# Patient Record
Sex: Female | Born: 1965 | Race: Black or African American | Hispanic: No | Marital: Married | Smoking: Never smoker
Health system: Southern US, Community
[De-identification: ages and names within clinical notes are randomized; demographics above are authoritative.]

## PROBLEM LIST (undated history)

## (undated) DIAGNOSIS — N852 Hypertrophy of uterus: Secondary | ICD-10-CM

## (undated) DIAGNOSIS — R739 Hyperglycemia, unspecified: Secondary | ICD-10-CM

## (undated) DIAGNOSIS — I10 Essential (primary) hypertension: Secondary | ICD-10-CM

## (undated) DIAGNOSIS — T7840XA Allergy, unspecified, initial encounter: Secondary | ICD-10-CM

## (undated) DIAGNOSIS — E559 Vitamin D deficiency, unspecified: Secondary | ICD-10-CM

## (undated) DIAGNOSIS — N393 Stress incontinence (female) (male): Secondary | ICD-10-CM

## (undated) DIAGNOSIS — E785 Hyperlipidemia, unspecified: Secondary | ICD-10-CM

## (undated) DIAGNOSIS — R87619 Unspecified abnormal cytological findings in specimens from cervix uteri: Secondary | ICD-10-CM

## (undated) DIAGNOSIS — B977 Papillomavirus as the cause of diseases classified elsewhere: Secondary | ICD-10-CM

## (undated) HISTORY — DX: Hyperlipidemia, unspecified: E78.5

## (undated) HISTORY — DX: Stress incontinence (female) (male): N39.3

## (undated) HISTORY — DX: Unspecified abnormal cytological findings in specimens from cervix uteri: R87.619

## (undated) HISTORY — DX: Vitamin D deficiency, unspecified: E55.9

## (undated) HISTORY — DX: Essential (primary) hypertension: I10

## (undated) HISTORY — DX: Allergy, unspecified, initial encounter: T78.40XA

## (undated) HISTORY — PX: TUBAL LIGATION: SHX77

## (undated) HISTORY — DX: Hypertrophy of uterus: N85.2

## (undated) HISTORY — DX: Papillomavirus as the cause of diseases classified elsewhere: B97.7

## (undated) HISTORY — DX: Hyperglycemia, unspecified: R73.9

---

## 2005-01-09 ENCOUNTER — Ambulatory Visit: Payer: Self-pay | Admitting: Family Medicine

## 2006-02-28 ENCOUNTER — Ambulatory Visit: Payer: Self-pay | Admitting: Family Medicine

## 2007-02-04 ENCOUNTER — Ambulatory Visit: Payer: Self-pay | Admitting: Family Medicine

## 2007-03-04 ENCOUNTER — Ambulatory Visit: Payer: Self-pay | Admitting: Family Medicine

## 2007-12-25 ENCOUNTER — Encounter: Payer: Self-pay | Admitting: Maternal & Fetal Medicine

## 2008-01-24 ENCOUNTER — Emergency Department: Payer: Self-pay | Admitting: Emergency Medicine

## 2008-05-27 ENCOUNTER — Encounter: Payer: Self-pay | Admitting: Maternal & Fetal Medicine

## 2008-07-11 ENCOUNTER — Inpatient Hospital Stay: Payer: Self-pay | Admitting: Obstetrics and Gynecology

## 2009-01-20 ENCOUNTER — Ambulatory Visit: Payer: Self-pay | Admitting: Family Medicine

## 2010-02-13 ENCOUNTER — Encounter: Payer: Self-pay | Admitting: Maternal & Fetal Medicine

## 2010-05-01 ENCOUNTER — Ambulatory Visit: Payer: Self-pay

## 2010-08-17 ENCOUNTER — Observation Stay: Payer: Self-pay | Admitting: Obstetrics and Gynecology

## 2010-08-31 ENCOUNTER — Inpatient Hospital Stay: Payer: Self-pay | Admitting: Advanced Practice Midwife

## 2010-09-05 LAB — PATHOLOGY REPORT

## 2011-07-03 ENCOUNTER — Ambulatory Visit: Payer: Self-pay | Admitting: Family Medicine

## 2013-02-10 ENCOUNTER — Ambulatory Visit: Payer: Self-pay | Admitting: Family Medicine

## 2014-02-05 DIAGNOSIS — B977 Papillomavirus as the cause of diseases classified elsewhere: Secondary | ICD-10-CM | POA: Insufficient documentation

## 2014-02-05 LAB — LIPID PANEL
Cholesterol: 217 mg/dL — AB (ref 0–200)
HDL: 50 mg/dL (ref 35–70)
LDL Cholesterol: 144 mg/dL
Triglycerides: 115 mg/dL (ref 40–160)

## 2014-02-05 LAB — HM PAP SMEAR: HM PAP: HIGH

## 2014-02-11 ENCOUNTER — Ambulatory Visit: Payer: Self-pay | Admitting: Family Medicine

## 2014-02-11 LAB — HM MAMMOGRAPHY: HM MAMMO: NORMAL

## 2014-03-01 LAB — HEMOGLOBIN A1C: Hgb A1c MFr Bld: 5.7 % (ref 4.0–6.0)

## 2015-01-21 DIAGNOSIS — R87619 Unspecified abnormal cytological findings in specimens from cervix uteri: Secondary | ICD-10-CM

## 2015-01-21 HISTORY — DX: Unspecified abnormal cytological findings in specimens from cervix uteri: R87.619

## 2015-02-06 ENCOUNTER — Encounter: Payer: Self-pay | Admitting: Family Medicine

## 2015-02-06 DIAGNOSIS — E669 Obesity, unspecified: Secondary | ICD-10-CM | POA: Insufficient documentation

## 2015-02-06 DIAGNOSIS — E785 Hyperlipidemia, unspecified: Secondary | ICD-10-CM | POA: Insufficient documentation

## 2015-02-06 DIAGNOSIS — R739 Hyperglycemia, unspecified: Secondary | ICD-10-CM | POA: Insufficient documentation

## 2015-02-06 DIAGNOSIS — N393 Stress incontinence (female) (male): Secondary | ICD-10-CM | POA: Insufficient documentation

## 2015-02-06 DIAGNOSIS — J302 Other seasonal allergic rhinitis: Secondary | ICD-10-CM | POA: Insufficient documentation

## 2015-02-06 DIAGNOSIS — J3089 Other allergic rhinitis: Secondary | ICD-10-CM

## 2015-02-06 DIAGNOSIS — M722 Plantar fascial fibromatosis: Secondary | ICD-10-CM | POA: Insufficient documentation

## 2015-02-06 DIAGNOSIS — E559 Vitamin D deficiency, unspecified: Secondary | ICD-10-CM | POA: Insufficient documentation

## 2015-02-06 DIAGNOSIS — I10 Essential (primary) hypertension: Secondary | ICD-10-CM | POA: Insufficient documentation

## 2015-02-08 ENCOUNTER — Encounter: Payer: Self-pay | Admitting: Family Medicine

## 2015-02-08 ENCOUNTER — Ambulatory Visit (INDEPENDENT_AMBULATORY_CARE_PROVIDER_SITE_OTHER): Payer: BC Managed Care – PPO | Admitting: Family Medicine

## 2015-02-08 ENCOUNTER — Other Ambulatory Visit: Payer: Self-pay | Admitting: Family Medicine

## 2015-02-08 VITALS — BP 128/68 | HR 88 | Temp 97.7°F | Resp 16 | Ht 62.8 in | Wt 193.3 lb

## 2015-02-08 DIAGNOSIS — E785 Hyperlipidemia, unspecified: Secondary | ICD-10-CM

## 2015-02-08 DIAGNOSIS — Z124 Encounter for screening for malignant neoplasm of cervix: Secondary | ICD-10-CM

## 2015-02-08 DIAGNOSIS — R739 Hyperglycemia, unspecified: Secondary | ICD-10-CM | POA: Diagnosis not present

## 2015-02-08 DIAGNOSIS — Z01419 Encounter for gynecological examination (general) (routine) without abnormal findings: Secondary | ICD-10-CM

## 2015-02-08 DIAGNOSIS — E669 Obesity, unspecified: Secondary | ICD-10-CM

## 2015-02-08 DIAGNOSIS — Z0000 Encounter for general adult medical examination without abnormal findings: Secondary | ICD-10-CM

## 2015-02-08 DIAGNOSIS — I10 Essential (primary) hypertension: Secondary | ICD-10-CM

## 2015-02-08 DIAGNOSIS — Z1239 Encounter for other screening for malignant neoplasm of breast: Secondary | ICD-10-CM | POA: Diagnosis not present

## 2015-02-08 DIAGNOSIS — N951 Menopausal and female climacteric states: Secondary | ICD-10-CM

## 2015-02-08 NOTE — Patient Instructions (Signed)
Discussed importance of 150 minutes of physical activity weekly, eat two servings of fish weekly, eat one serving of tree nuts ( cashews, pistachios, pecans, almonds.Marland Kitchen) every other day, eat 6 servings of fruit/vegetables daily and drink plenty of water and avoid sweet beverages.    Perimenopause Perimenopause is the time when your body begins to move into the menopause (no menstrual period for 12 straight months). It is a natural process. Perimenopause can begin 2-8 years before the menopause and usually lasts for 1 year after the menopause. During this time, your ovaries may or may not produce an egg. The ovaries vary in their production of estrogen and progesterone hormones each month. This can cause irregular menstrual periods, difficulty getting pregnant, vaginal bleeding between periods, and uncomfortable symptoms. CAUSES  Irregular production of the ovarian hormones, estrogen and progesterone, and not ovulating every month.  Other causes include:  Tumor of the pituitary gland in the brain.  Medical disease that affects the ovaries.  Radiation treatment.  Chemotherapy.  Unknown causes.  Heavy smoking and excessive alcohol intake can bring on perimenopause sooner. SIGNS AND SYMPTOMS   Hot flashes.  Night sweats.  Irregular menstrual periods.  Decreased sex drive.  Vaginal dryness.  Headaches.  Mood swings.  Depression.  Memory problems.  Irritability.  Tiredness.  Weight gain.  Trouble getting pregnant.  The beginning of losing bone cells (osteoporosis).  The beginning of hardening of the arteries (atherosclerosis). DIAGNOSIS  Your health care provider will make a diagnosis by analyzing your age, menstrual history, and symptoms. He or she will do a physical exam and note any changes in your body, especially your female organs. Female hormone tests may or may not be helpful depending on the amount of female hormones you produce and when you produce them.  However, other hormone tests may be helpful to rule out other problems. TREATMENT  In some cases, no treatment is needed. The decision on whether treatment is necessary during the perimenopause should be made by you and your health care provider based on how the symptoms are affecting you and your lifestyle. Various treatments are available, such as:  Treating individual symptoms with a specific medicine for that symptom.  Herbal medicines that can help specific symptoms.  Counseling.  Group therapy. HOME CARE INSTRUCTIONS   Keep track of your menstrual periods (when they occur, how heavy they are, how long between periods, and how long they last) as well as your symptoms and when they started.  Only take over-the-counter or prescription medicines as directed by your health care provider.  Sleep and rest.  Exercise.  Eat a diet that contains calcium (good for your bones) and soy (acts like the estrogen hormone).  Do not smoke.  Avoid alcoholic beverages.  Take vitamin supplements as recommended by your health care provider. Taking vitamin E may help in certain cases.  Take calcium and vitamin D supplements to help prevent bone loss.  Group therapy is sometimes helpful.  Acupuncture may help in some cases. SEEK MEDICAL CARE IF:   You have questions about any symptoms you are having.  You need a referral to a specialist (gynecologist, psychiatrist, or psychologist). SEEK IMMEDIATE MEDICAL CARE IF:   You have vaginal bleeding.  Your period lasts longer than 8 days.  Your periods are recurring sooner than 21 days.  You have bleeding after intercourse.  You have severe depression.  You have pain when you urinate.  You have severe headaches.  You have vision problems. Document Released:  08/16/2004 Document Revised: 04/29/2013 Document Reviewed: 02/05/2013 [facility] Patient Information 2015 [place], [place]. This information is not intended to replace advice given to  you by your health care provider. Make sure you discuss any questions you have with your health care provider.

## 2015-02-08 NOTE — Progress Notes (Signed)
Name: Karen Donaldson   MRN: [id number]    DOB: 1966-06-17   Date:02/08/2015       Progress Note  Subjective  Chief Complaint  Chief Complaint  Patient presents with  . Annual Exam    HPI  Well woman Exam: feeling well, lost 4 lbs but she does not know how. She is noticing hot flashes, occasional night sweats, cycles are still regular, sexually active with husband only.  Patient Active Problem List   Diagnosis Date Noted  . Benign essential HTN 02/06/2015  . Dyslipidemia 02/06/2015  . Blood glucose elevated 02/06/2015  . Obesity (BMI 30-39.9) 02/06/2015  . Perennial allergic rhinitis with seasonal variation 02/06/2015  . Plantar fasciitis 02/06/2015  . Stress incontinence 02/06/2015  . Vitamin D deficiency 02/06/2015  . HPV (human papilloma virus) infection 02/05/2014    Past Surgical History  Procedure Laterality Date  . Tubal ligation    . Cesarean section  2009, 2012    Family History  Problem Relation Age of Onset  . Hypertension Mother   . Diabetes Mother   . Hypertension Father     History   Social History  . Marital Status: Married    Spouse Name: N/A  . Number of Children: N/A  . Years of Education: N/A   Occupational History  . Not on file.   Social History Main Topics  . Smoking status: Never Smoker   . Smokeless tobacco: Never Used  . Alcohol Use: No  . Drug Use: No  . Sexual Activity:    Partners: Male   Other Topics Concern  . Not on file   Social History Narrative  . No narrative on file     Current outpatient prescriptions:  .  Calcium Carbonate-Vitamin D 600-400 MG-UNIT per chew tablet, Chew 1 tablet by mouth daily., Disp: , Rfl:  .  diltiazem (CARDIZEM LA) 180 MG 24 hr tablet, Take 1 tablet by mouth daily., Disp: , Rfl:  .  hydrochlorothiazide (HYDRODIURIL) 12.5 MG tablet, Take 1 tablet by mouth daily., Disp: , Rfl:  .  mometasone (NASONEX) 50 MCG/ACT nasal spray, Place 2 sprays into the nose 2 (two) times daily as needed.,  Disp: , Rfl:  .  montelukast (SINGULAIR) 10 MG tablet, Take 1 tablet by mouth daily., Disp: , Rfl:  .  MULTIPLE VITAMIN PO, Take 1 tablet by mouth daily., Disp: , Rfl:   Allergies  Allergen Reactions  . Ace Inhibitors   . Sulfamethoxazole-Trimethoprim      ROS  Constitutional: Negative for fever or weight change.  Respiratory: Negative for cough and shortness of breath.   Cardiovascular: Negative for chest pain or palpitations.  Gastrointestinal: Negative for abdominal pain, no bowel changes.  Musculoskeletal: Negative for gait problem or joint swelling.  Skin: Negative for rash.  Neurological: Negative for dizziness or headache.  No other specific complaints in a complete review of systems (except as listed in HPI above).  Objective  Filed Vitals:   02/08/15 0858  BP: 128/68  Pulse: 88  Temp: 97.7 F (36.5 C)  TempSrc: Oral  Resp: 16  Height: 5' 2.8" (1.595 m)  Weight: 193 lb 4.8 oz (87.68 kg)  SpO2: 96%    Body mass index is 34.47 kg/(m^2).  Physical Exam  Constitutional: Patient appears well-developed and well-nourished. No distress.  HENT: Head: Normocephalic and atraumatic. Ears: B TMs ok, no erythema or effusion; Nose: Nose normal. Mouth/Throat: Oropharynx is clear and moist. No oropharyngeal exudate.  Eyes: Conjunctivae and EOM are  normal. Pupils are equal, round, and reactive to light. No scleral icterus.  Neck: Normal range of motion. Neck supple. No JVD present. No thyromegaly present.  Cardiovascular: Normal rate, regular rhythm and normal heart sounds.  No murmur heard. No BLE edema. Pulmonary/Chest: Effort normal and breath sounds normal. No respiratory distress. Abdominal: Soft. Bowel sounds are normal, no distension. There is no tenderness. no masses Breast: no lumps or masses, no nipple discharge or rashes FEMALE GENITALIA:  External genitalia normal External urethra normal Vaginal vault normal without discharge or lesions Cervix normal without  discharge or lesions Bimanual exam normal without masses RECTAL: no external hemorrhoids Musculoskeletal: Normal range of motion, no joint effusions. No gross deformities Neurological: he is alert and oriented to person, place, and time. No cranial nerve deficit. Coordination, balance, strength, speech and gait are normal.  Skin: Skin is warm and dry. No rash noted. No erythema.  Psychiatric: Patient has a normal mood and affect. behavior is normal. Judgment and thought content normal.  PHQ2/9: Depression screen PHQ 2/9 02/08/2015  Decreased Interest 0  Down, Depressed, Hopeless 0  PHQ - 2 Score 0     Fall Risk: Fall Risk  02/08/2015  Falls in the past year? No     Assessment & Plan  1. Well woman exam Discussed importance of 150 minutes of physical activity weekly, eat two servings of fish weekly, eat one serving of tree nuts ( cashews, pistachios, pecans, almonds.Marland Kitchen) every other day, eat 6 servings of fruit/vegetables daily and drink plenty of water and avoid sweet beverages.   2. Cervical cancer screening History of positive HPV - Cytology - PAP  3. Breast cancer screening  - MM Digital Screening; Future  4. Obesity (BMI 30-39.9) Discussed diet and exercise  5. Blood glucose elevated  - HgB A1c  6. Dyslipidemia  - Lipid Profile  7. Benign essential HTN  - Comprehensive Metabolic Panel (CMET)  8. Perimenopause Discussed symptoms with patient and offered medication but she does not want it at this time

## 2015-02-09 LAB — COMPREHENSIVE METABOLIC PANEL
A/G RATIO: 1.7 (ref 1.1–2.5)
ALBUMIN: 4.5 g/dL (ref 3.5–5.5)
ALT: 14 IU/L (ref 0–32)
AST: 14 IU/L (ref 0–40)
Alkaline Phosphatase: 76 IU/L (ref 39–117)
BUN / CREAT RATIO: 20 (ref 9–23)
BUN: 17 mg/dL (ref 6–24)
Bilirubin Total: 0.3 mg/dL (ref 0.0–1.2)
CO2: 24 mmol/L (ref 18–29)
CREATININE: 0.84 mg/dL (ref 0.57–1.00)
Calcium: 9.5 mg/dL (ref 8.7–10.2)
Chloride: 101 mmol/L (ref 97–108)
GFR calc Af Amer: 94 mL/min/{1.73_m2} (ref >59)
GFR calc non Af Amer: 82 mL/min/{1.73_m2} (ref >59)
GLOBULIN, TOTAL: 2.6 g/dL (ref 1.5–4.5)
Glucose: 105 mg/dL — ABNORMAL HIGH (ref 65–99)
POTASSIUM: 4.6 mmol/L (ref 3.5–5.2)
Sodium: 142 mmol/L (ref 134–144)
TOTAL PROTEIN: 7.1 g/dL (ref 6.0–8.5)

## 2015-02-09 LAB — LIPID PANEL
Chol/HDL Ratio: 4.1 ratio units (ref 0.0–4.4)
Cholesterol, Total: 224 mg/dL — ABNORMAL HIGH (ref 100–199)
HDL: 54 mg/dL (ref >39)
LDL CALC: 141 mg/dL — AB (ref 0–99)
Triglycerides: 145 mg/dL (ref 0–149)
VLDL Cholesterol Cal: 29 mg/dL (ref 5–40)

## 2015-02-09 LAB — HEMOGLOBIN A1C
ESTIMATED AVERAGE GLUCOSE: 123 mg/dL
Hgb A1c MFr Bld: 5.9 % — ABNORMAL HIGH (ref 4.8–5.6)

## 2015-02-10 NOTE — Progress Notes (Signed)
Patient notified

## 2015-02-15 LAB — PAP LB AND HPV HIGH-RISK
HPV, HIGH-RISK: POSITIVE — AB
PAP Smear Comment: 0

## 2015-02-21 ENCOUNTER — Ambulatory Visit
Admission: RE | Admit: 2015-02-21 | Discharge: 2015-02-21 | Disposition: A | Discharge status: Home / Self Care | Payer: BC Managed Care – PPO | Source: Ambulatory Visit | Attending: Family Medicine | Admitting: Family Medicine

## 2015-02-21 DIAGNOSIS — Z1239 Encounter for other screening for malignant neoplasm of breast: Secondary | ICD-10-CM

## 2015-02-21 DIAGNOSIS — Z1231 Encounter for screening mammogram for malignant neoplasm of breast: Secondary | ICD-10-CM | POA: Diagnosis present

## 2015-02-22 ENCOUNTER — Other Ambulatory Visit: Payer: Self-pay | Admitting: Family Medicine

## 2015-02-22 ENCOUNTER — Encounter: Payer: Self-pay | Admitting: Family Medicine

## 2015-02-22 DIAGNOSIS — R87810 Cervical high risk human papillomavirus (HPV) DNA test positive: Principal | ICD-10-CM

## 2015-02-22 DIAGNOSIS — N870 Mild cervical dysplasia: Secondary | ICD-10-CM | POA: Insufficient documentation

## 2015-02-22 DIAGNOSIS — R87610 Atypical squamous cells of undetermined significance on cytologic smear of cervix (ASC-US): Secondary | ICD-10-CM

## 2015-02-23 NOTE — Progress Notes (Signed)
Patient notified

## 2015-03-03 ENCOUNTER — Other Ambulatory Visit: Payer: Self-pay | Admitting: Family Medicine

## 2015-03-04 NOTE — Telephone Encounter (Signed)
Patient requesting refill. 

## 2015-04-06 ENCOUNTER — Encounter: Payer: BC Managed Care – PPO | Admitting: Obstetrics and Gynecology

## 2015-04-14 ENCOUNTER — Encounter: Payer: Self-pay | Admitting: Obstetrics and Gynecology

## 2015-04-14 ENCOUNTER — Ambulatory Visit (INDEPENDENT_AMBULATORY_CARE_PROVIDER_SITE_OTHER): Payer: BC Managed Care – PPO | Admitting: Obstetrics and Gynecology

## 2015-04-14 VITALS — BP 148/92 | HR 73 | Ht 61.0 in | Wt 193.2 lb

## 2015-04-14 DIAGNOSIS — IMO0002 Reserved for concepts with insufficient information to code with codable children: Secondary | ICD-10-CM

## 2015-04-14 DIAGNOSIS — R896 Abnormal cytological findings in specimens from other organs, systems and tissues: Secondary | ICD-10-CM | POA: Diagnosis not present

## 2015-04-14 NOTE — Patient Instructions (Signed)
Colposcopy Colposcopy is a procedure to examine your cervix and vagina, or the area around the outside of your vagina, for abnormalities or signs of disease. The procedure is done using a lighted microscope called a colposcope. Tissue samples may be collected during the colposcopy if your health care provider finds any unusual cells. A colposcopy may be done if a woman has:  An abnormal Pap test. A Pap test is a medical test done to evaluate cells that are on the surface of the cervix.  A Pap test result that is suggestive of human papillomavirus (HPV). This virus can cause genital warts and is linked to the development of cervical cancer.  A sore on her cervix and the results of a Pap test were normal.  Genital warts on the cervix or in or around the outside of the vagina.  A mother who took the drug diethylstilbestrol (DES) while pregnant.  Painful intercourse.  Vaginal bleeding, especially after sexual intercourse. LET [facility] CARE PROVIDER KNOW ABOUT:  Any allergies you have.  All medicines you are taking, including vitamins, herbs, eye drops, creams, and over-the-counter medicines.  Previous problems you or members of your family have had with the use of anesthetics.  Any blood disorders you have.  Previous surgeries you have had.  Medical conditions you have. RISKS AND COMPLICATIONS Generally, a colposcopy is a safe procedure. However, as with any procedure, complications can occur. Possible complications include:  Bleeding.  Infection.  Missed lesions. BEFORE THE PROCEDURE   Tell your health care provider if you have your menstrual period. A colposcopy typically is not done during menstruation.  For 24 hours before the colposcopy, do not:  [place].  Use tampons.  Use medicines, creams, or suppositories in the vagina.  Have sexual intercourse. PROCEDURE  During the procedure, you will be lying on your back with your feet in foot rests (stirrups). A warm  metal or plastic instrument (speculum) will be placed in your vagina to keep it open and to allow the health care provider to see the cervix. The colposcope will be placed outside the vagina. It will be used to magnify and examine the cervix, vagina, and the area around the outside of the vagina. A small amount of liquid solution will be placed on the area that is to be viewed. This solution will make it easier to see the abnormal cells. Your health care provider will use tools to suck out mucus and cells from the canal of the cervix. Then he or she will record the location of the abnormal areas. If a biopsy is done during the procedure, a medicine will usually be given to numb the area (local anesthetic). You may feel mild pain or cramping while the biopsy is done. After the procedure, tissue samples collected during the biopsy will be sent to a lab for analysis. AFTER THE PROCEDURE  You will be given instructions on when to follow up with your health care provider for your test results. It is important to keep your appointment. Document Released: 09/29/2002 Document Revised: 03/11/2013 Document Reviewed: 02/05/2013 [facility] Patient Information 2015 [place], [place]. This information is not intended to replace advice given to you by your health care provider. Make sure you discuss any questions you have with your health care provider.

## 2015-04-16 NOTE — Progress Notes (Signed)
    GYNECOLOGY CLINIC COLPOSCOPY PROCEDURE NOTE  Karen Donaldson is a 49 y.o. [phone] here for colposcopy for ASCUS with POSITIVE high risk HPV pap smear on 02/08/2015. Discussed role for HPV in cervical dysplasia, need for surveillance.  Patient given informed consent, signed copy in the chart, time out was performed.  Placed in lithotomy position. Cervix viewed with speculum and colposcope after application of acetic acid.   Colposcopy adequate? Yes  no mosaicism, no punctation, no abnormal vasculature and acetowhite lesion(s) noted at 6 o'clock; corresponding biopsy obtained.  ECC specimen obtained. All specimens were labelled and sent to pathology.  Patient was given post procedure instructions.  Will follow up pathology and manage accordingly.  Routine preventative health maintenance measures emphasized.   Rubie Maid, MD [facility]

## 2015-04-18 LAB — PATHOLOGY

## 2015-04-25 ENCOUNTER — Encounter: Payer: Self-pay | Admitting: Obstetrics and Gynecology

## 2015-08-11 ENCOUNTER — Encounter: Payer: Self-pay | Admitting: Family Medicine

## 2015-08-11 ENCOUNTER — Ambulatory Visit (INDEPENDENT_AMBULATORY_CARE_PROVIDER_SITE_OTHER): Payer: BC Managed Care – PPO | Admitting: Family Medicine

## 2015-08-11 VITALS — BP 122/86 | HR 81 | Temp 97.8°F | Resp 18 | Ht 61.0 in | Wt 192.6 lb

## 2015-08-11 DIAGNOSIS — K5909 Other constipation: Secondary | ICD-10-CM

## 2015-08-11 DIAGNOSIS — I10 Essential (primary) hypertension: Secondary | ICD-10-CM | POA: Diagnosis not present

## 2015-08-11 DIAGNOSIS — Z23 Encounter for immunization: Secondary | ICD-10-CM

## 2015-08-11 DIAGNOSIS — J309 Allergic rhinitis, unspecified: Secondary | ICD-10-CM | POA: Diagnosis not present

## 2015-08-11 DIAGNOSIS — K5900 Constipation, unspecified: Secondary | ICD-10-CM | POA: Diagnosis not present

## 2015-08-11 DIAGNOSIS — R87810 Cervical high risk human papillomavirus (HPV) DNA test positive: Secondary | ICD-10-CM | POA: Diagnosis not present

## 2015-08-11 DIAGNOSIS — J3089 Other allergic rhinitis: Secondary | ICD-10-CM

## 2015-08-11 DIAGNOSIS — Z1211 Encounter for screening for malignant neoplasm of colon: Secondary | ICD-10-CM

## 2015-08-11 DIAGNOSIS — E785 Hyperlipidemia, unspecified: Secondary | ICD-10-CM

## 2015-08-11 DIAGNOSIS — R87610 Atypical squamous cells of undetermined significance on cytologic smear of cervix (ASC-US): Secondary | ICD-10-CM | POA: Diagnosis not present

## 2015-08-11 DIAGNOSIS — J302 Other seasonal allergic rhinitis: Secondary | ICD-10-CM

## 2015-08-11 MED ORDER — DILTIAZEM HCL ER COATED BEADS 180 MG PO CP24: 180.0000 mg | ORAL_CAPSULE | Freq: Every day | ORAL | Status: DC

## 2015-08-11 MED ORDER — CETIRIZINE HCL 10 MG PO TABS: 10.0000 mg | ORAL_TABLET | Freq: Every day | ORAL | Status: DC

## 2015-08-11 MED ORDER — HYDROCHLOROTHIAZIDE 12.5 MG PO TABS: 12.5000 mg | ORAL_TABLET | Freq: Every morning | ORAL | Status: DC

## 2015-08-11 MED ORDER — MONTELUKAST SODIUM 10 MG PO TABS: 10.0000 mg | ORAL_TABLET | Freq: Every day | ORAL | Status: DC

## 2015-08-11 NOTE — Progress Notes (Signed)
Name: Karen Donaldson   MRN: [id number]    DOB: 1965-10-13   Date:08/11/2015       Progress Note  Subjective  Chief Complaint  Chief Complaint  Patient presents with  . Medication Refill    6 month F/U  . Hypertension  . Allergic Rhinitis     Well controlled, but sometimes flaired up with weather changed    HPI  HTN: patient states she has been compliant with her medication, no chest pain, no palpitation, no swelling,, but she has constipation.  Chronic constipation:  she states she has constipation - she has a bowel movement every 2-3 days, it has been since childhood but now she has to strain. She sees blood in her stools at times. She has a history of hemorrhoids also. Advised to go for a colonoscopy now, but she wants to wait until she is 50  ASCUS with HPV: seen by Dr. Marcelline Mates, and has a follow up this Summer  AR: she has intermittent symptoms, she has episodes of nasal congestion, and a dry cough, but is doing better with singulair. No wheezing or SOB.    Patient Active Problem List   Diagnosis Date Noted  . ASCUS with positive high risk HPV cervical 02/22/2015  . Benign essential HTN 02/06/2015  . Dyslipidemia 02/06/2015  . Blood glucose elevated 02/06/2015  . Obesity (BMI 30-39.9) 02/06/2015  . Perennial allergic rhinitis with seasonal variation 02/06/2015  . Plantar fasciitis 02/06/2015  . Stress incontinence 02/06/2015  . Vitamin D deficiency 02/06/2015  . HPV (human papilloma virus) infection 02/05/2014    Past Surgical History  Procedure Laterality Date  . Tubal ligation    . Cesarean section  2009, 2012    Family History  Problem Relation Age of Onset  . Hypertension Mother   . Diabetes Mother   . Hypertension Father   . Breast cancer Maternal Aunt   . Ovarian cancer Neg Hx   . Colon cancer Neg Hx   . Heart disease Neg Hx     Social History   Social History  . Marital Status: Married    Spouse Name: N/A  . Number of Children: N/A  . Years  of Education: N/A   Occupational History  . Not on file.   Social History Main Topics  . Smoking status: Never Smoker   . Smokeless tobacco: Never Used  . Alcohol Use: No  . Drug Use: No  . Sexual Activity:    Partners: Male    Birth Control/ Protection: Surgical   Other Topics Concern  . Not on file   Social History Narrative     Current outpatient prescriptions:  .  Calcium Carbonate-Vitamin D 600-400 MG-UNIT per chew tablet, Chew 1 tablet by mouth daily., Disp: , Rfl:  .  diltiazem (CARDIZEM CD) 180 MG 24 hr capsule, Take 1 capsule (180 mg total) by mouth daily., Disp: 90 capsule, Rfl: 1 .  hydrochlorothiazide (HYDRODIURIL) 12.5 MG tablet, Take 1 tablet (12.5 mg total) by mouth every morning., Disp: 90 tablet, Rfl: 1 .  montelukast (SINGULAIR) 10 MG tablet, Take 1 tablet (10 mg total) by mouth daily., Disp: 90 tablet, Rfl: 1 .  MULTIPLE VITAMIN PO, Take 1 tablet by mouth daily., Disp: , Rfl:  .  cetirizine (ZYRTEC) 10 MG tablet, Take 1 tablet (10 mg total) by mouth daily., Disp: 30 tablet, Rfl: 0  Allergies  Allergen Reactions  . Ace Inhibitors   . Sulfamethoxazole-Trimethoprim      ROS  Constitutional: Negative for fever or weight change.  Respiratory: Negative for cough and shortness of breath.   Cardiovascular: Negative for chest pain or palpitations.  Gastrointestinal: Negative for abdominal pain, no bowel changes.  Musculoskeletal: Negative for gait problem or joint swelling.  Skin: Negative for rash.  Neurological: Negative for dizziness or headache.  No other specific complaints in a complete review of systems (except as listed in HPI above).  Objective  Filed Vitals:   08/11/15 1558  BP: 122/86  Pulse: 81  Temp: 97.8 F (36.6 C)  TempSrc: Oral  Resp: 18  Height: 5' 1"  (1.549 m)  Weight: 192 lb 9.6 oz (87.363 kg)  SpO2: 98%    Body mass index is 36.41 kg/(m^2).  Physical Exam  Constitutional: Patient appears well-developed and  well-nourished. Obese  No distress.  HEENT: head atraumatic, normocephalic, pupils equal and reactive to light,  neck supple, throat within normal limits Cardiovascular: Normal rate, regular rhythm and normal heart sounds.  No murmur heard. No BLE edema. Pulmonary/Chest: Effort normal and breath sounds normal. No respiratory distress. Abdominal: Soft.  There is no tenderness. Psychiatric: Patient has a normal mood and affect. behavior is normal. Judgment and thought content normal.  PHQ2/9: Depression screen [facility] 2/9 08/11/2015 02/08/2015  Decreased Interest 0 0  Down, Depressed, Hopeless 0 0  PHQ - 2 Score 0 0    Fall Risk: Fall Risk  08/11/2015 02/08/2015  Falls in the past year? No No    Functional Status Survey: Is the patient deaf or have difficulty hearing?: No Does the patient have difficulty seeing, even when wearing glasses/contacts?: No Does the patient have difficulty concentrating, remembering, or making decisions?: No Does the patient have difficulty walking or climbing stairs?: No Does the patient have difficulty dressing or bathing?: No Does the patient have difficulty doing errands alone such as visiting a doctor's office or shopping?: No    Assessment & Plan  1. Dyslipidemia  Continue life style modification  2. Benign essential HTN  - diltiazem (CARDIZEM CD) 180 MG 24 hr capsule; Take 1 capsule (180 mg total) by mouth daily.  Dispense: 90 capsule; Refill: 1 - hydrochlorothiazide (HYDRODIURIL) 12.5 MG tablet; Take 1 tablet (12.5 mg total) by mouth every morning.  Dispense: 90 tablet; Refill: 1  3. ASCUS with positive high risk HPV cervical  Keep follow up with GYN  4. Perennial allergic rhinitis with seasonal variation  - montelukast (SINGULAIR) 10 MG tablet; Take 1 tablet (10 mg total) by mouth daily.  Dispense: 90 tablet; Refill: 1 - cetirizine (ZYRTEC) 10 MG tablet; Take 1 tablet (10 mg total) by mouth daily.  Dispense: 30 tablet; Refill: 0  5. Chronic  constipation  Discussed eating a high fiber diet and increase fluids , at least 64 ounces of water daily , her diet is poor in fiber and only drinks about 24 ounces daily of water.   6. Colon cancer screening  - Ambulatory referral to Gastroenterology

## 2015-08-11 NOTE — Addendum Note (Signed)
Addended by: Inda Coke on: 08/11/2015 04:30 PM   Modules accepted: Orders

## 2015-08-12 ENCOUNTER — Other Ambulatory Visit: Payer: Self-pay

## 2015-08-12 ENCOUNTER — Telehealth: Payer: Self-pay

## 2015-08-12 DIAGNOSIS — Z1211 Encounter for screening for malignant neoplasm of colon: Secondary | ICD-10-CM

## 2015-08-12 NOTE — Telephone Encounter (Signed)
Gastroenterology Pre-Procedure Review  Request Date: 02/03/16 Requesting Physician: Dr. Ancil Boozer  PATIENT REVIEW QUESTIONS: The patient responded to the following health history questions as indicated:    1. Are you having any GI issues? no 2. Do you have a personal history of Polyps? no 3. Do you have a family history of Colon Cancer or Polyps? no 4. Diabetes Mellitus? no 5. Joint replacements in the past 12 months?no 6. Major health problems in the past 3 months?no 7. Any artificial heart valves, MVP, or defibrillator?no    MEDICATIONS & ALLERGIES:    Patient reports the following regarding taking any anticoagulation/antiplatelet therapy:   Plavix, Coumadin, Eliquis, Xarelto, Lovenox, Pradaxa, Brilinta, or Effient? no Aspirin? no  Patient confirms/reports the following medications:  Current Outpatient Prescriptions  Medication Sig Dispense Refill  . Calcium Carbonate-Vitamin D 600-400 MG-UNIT per chew tablet Chew 1 tablet by mouth daily.    . cetirizine (ZYRTEC) 10 MG tablet Take 1 tablet (10 mg total) by mouth daily. 30 tablet 0  . diltiazem (CARDIZEM CD) 180 MG 24 hr capsule Take 1 capsule (180 mg total) by mouth daily. 90 capsule 1  . hydrochlorothiazide (HYDRODIURIL) 12.5 MG tablet Take 1 tablet (12.5 mg total) by mouth every morning. 90 tablet 1  . montelukast (SINGULAIR) 10 MG tablet Take 1 tablet (10 mg total) by mouth daily. 90 tablet 1  . MULTIPLE VITAMIN PO Take 1 tablet by mouth daily.     No current facility-administered medications for this visit.    Patient confirms/reports the following allergies:  Allergies  Allergen Reactions  . Ace Inhibitors   . Sulfamethoxazole-Trimethoprim     No orders of the defined types were placed in this encounter.    AUTHORIZATION INFORMATION Primary Insurance: 1D#: Group #:  Secondary Insurance: 1D#: Group #:  SCHEDULE INFORMATION: Date: 02/03/16 Time: Location: [place]

## 2015-09-10 ENCOUNTER — Other Ambulatory Visit: Payer: Self-pay | Admitting: Family Medicine

## 2015-10-21 ENCOUNTER — Ambulatory Visit (INDEPENDENT_AMBULATORY_CARE_PROVIDER_SITE_OTHER): Payer: BC Managed Care – PPO | Admitting: Family Medicine

## 2015-10-21 ENCOUNTER — Encounter: Payer: Self-pay | Admitting: Family Medicine

## 2015-10-21 VITALS — BP 116/78 | HR 100 | Temp 98.1°F | Resp 16 | Wt 189.9 lb

## 2015-10-21 DIAGNOSIS — J40 Bronchitis, not specified as acute or chronic: Secondary | ICD-10-CM

## 2015-10-21 DIAGNOSIS — J069 Acute upper respiratory infection, unspecified: Secondary | ICD-10-CM

## 2015-10-21 MED ORDER — PREDNISONE 20 MG PO TABS: 20.0000 mg | ORAL_TABLET | Freq: Every day | ORAL | Status: DC

## 2015-10-21 MED ORDER — BENZONATATE 100 MG PO CAPS: 100.0000 mg | ORAL_CAPSULE | Freq: Three times a day (TID) | ORAL | Status: DC | PRN

## 2015-10-21 MED ORDER — AZITHROMYCIN 250 MG PO TABS: ORAL_TABLET | ORAL | Status: DC

## 2015-10-21 NOTE — Progress Notes (Signed)
Name: Karen Donaldson   MRN: [id number]    DOB: 1965/08/25   Date:10/21/2015       Progress Note  Subjective  Chief Complaint  Chief Complaint  Patient presents with  . Sinusitis    about 2 weeks. otc: zyrtec, alk-selzer, cough drops. nasal mucus is yellowish green.  . Cough    productive, yellowish green.  . Ear Pain    right ear pain and pressure  . Shortness of Breath    when she has a couging spell.  . Back Pain    started with the cough    HPI  URI/Bronchitis: she states symptoms started as a cold ( rhinorrhea, nasal congestion and fatigue ) last week, and got progressively worse with a dry cough that now is productive ( yellow phlegm ) and associated with SOB at times. She is sore from coughing, also sometimes has stress incontinence. She had to leave work twice this week because of fatigue. Mild right ear pressure. No fever. Taking multiple otc medications with mild control symptoms.    Patient Active Problem List   Diagnosis Date Noted  . ASCUS with positive high risk HPV cervical 02/22/2015  . Benign essential HTN 02/06/2015  . Dyslipidemia 02/06/2015  . Blood glucose elevated 02/06/2015  . Obesity (BMI 30-39.9) 02/06/2015  . Perennial allergic rhinitis with seasonal variation 02/06/2015  . Plantar fasciitis 02/06/2015  . Stress incontinence 02/06/2015  . Vitamin D deficiency 02/06/2015  . HPV (human papilloma virus) infection 02/05/2014    Past Surgical History  Procedure Laterality Date  . Tubal ligation    . Cesarean section  2009, 2012    Family History  Problem Relation Age of Onset  . Hypertension Mother   . Diabetes Mother   . Hypertension Father   . Breast cancer Maternal Aunt   . Ovarian cancer Neg Hx   . Colon cancer Neg Hx   . Heart disease Neg Hx     Social History   Social History  . Marital Status: Married    Spouse Name: N/A  . Number of Children: N/A  . Years of Education: N/A   Occupational History  . Not on file.    Social History Main Topics  . Smoking status: Never Smoker   . Smokeless tobacco: Never Used  . Alcohol Use: No  . Drug Use: No  . Sexual Activity:    Partners: Male    Birth Control/ Protection: Surgical   Other Topics Concern  . Not on file   Social History Narrative     Current outpatient prescriptions:  .  Calcium Carbonate-Vitamin D 600-400 MG-UNIT per chew tablet, Chew 1 tablet by mouth daily., Disp: , Rfl:  .  cetirizine (ZYRTEC) 10 MG tablet, Take 1 tablet (10 mg total) by mouth daily., Disp: 30 tablet, Rfl: 0 .  diltiazem (CARDIZEM CD) 180 MG 24 hr capsule, Take 1 capsule (180 mg total) by mouth daily., Disp: 90 capsule, Rfl: 1 .  hydrochlorothiazide (HYDRODIURIL) 12.5 MG tablet, Take 1 tablet (12.5 mg total) by mouth every morning., Disp: 90 tablet, Rfl: 1 .  montelukast (SINGULAIR) 10 MG tablet, Take 1 tablet (10 mg total) by mouth daily., Disp: 90 tablet, Rfl: 1 .  MULTIPLE VITAMIN PO, Take 1 tablet by mouth daily., Disp: , Rfl:  .  azithromycin (ZITHROMAX Z-PAK) 250 MG tablet, Take as directed, Disp: 6 each, Rfl: 0 .  benzonatate (TESSALON) 100 MG capsule, Take 1-2 capsules (100-200 mg total) by mouth 3 (three)  times daily as needed., Disp: 50 capsule, Rfl: 0 .  predniSONE (DELTASONE) 20 MG tablet, Take 1 tablet (20 mg total) by mouth daily with breakfast., Disp: 10 tablet, Rfl: 0  Allergies  Allergen Reactions  . Ace Inhibitors   . Sulfamethoxazole-Trimethoprim      ROS  Ten systems reviewed and is negative except as mentioned in HPI   Objective  Filed Vitals:   10/21/15 0820  BP: 116/78  Pulse: 100  Temp: 98.1 F (36.7 C)  TempSrc: Oral  Resp: 16  Weight: 189 lb 14.4 oz (86.138 kg)  SpO2: 97%    Body mass index is 35.9 kg/(m^2).  Physical Exam  Constitutional: Patient appears well-developed and well-nourished. Obese No distress.  HEENT: head atraumatic, normocephalic, pupils equal and reactive to light, ears TM normal bilaterally, neck  supple, throat within normal limits Cardiovascular: Normal rate, regular rhythm and normal heart sounds.  No murmur heard. No BLE edema. Pulmonary/Chest: Effort normal and breath sounds normal. No respiratory distress. Abdominal: Soft.  There is no tenderness. Psychiatric: Patient has a normal mood and affect. behavior is normal. Judgment and thought content normal.  PHQ2/9: Depression screen [facility] 2/9 10/21/2015 08/11/2015 02/08/2015  Decreased Interest 0 0 0  Down, Depressed, Hopeless 0 0 0  PHQ - 2 Score 0 0 0     Fall Risk: Fall Risk  10/21/2015 08/11/2015 02/08/2015  Falls in the past year? No No No     Functional Status Survey: Is the patient deaf or have difficulty hearing?: No Does the patient have difficulty seeing, even when wearing glasses/contacts?: No Does the patient have difficulty concentrating, remembering, or making decisions?: No Does the patient have difficulty walking or climbing stairs?: No Does the patient have difficulty dressing or bathing?: No Does the patient have difficulty doing errands alone such as visiting a doctor's office or shopping?: No    Assessment & Plan  1. Upper respiratory infection  Continue otc medication   2. Bronchitis  Symptoms are going on for two weeks and getting worse, discussed possible side effects of medications, including the importance of taking prednisone with food and not right before bed time.  - predniSONE (DELTASONE) 20 MG tablet; Take 1 tablet (20 mg total) by mouth daily with breakfast.  Dispense: 10 tablet; Refill: 0 - azithromycin (ZITHROMAX Z-PAK) 250 MG tablet; Take as directed  Dispense: 6 each; Refill: 0 - benzonatate (TESSALON) 100 MG capsule; Take 1-2 capsules (100-200 mg total) by mouth 3 (three) times daily as needed.  Dispense: 50 capsule; Refill: 0

## 2016-01-16 ENCOUNTER — Other Ambulatory Visit: Payer: Self-pay | Admitting: Family Medicine

## 2016-01-16 DIAGNOSIS — Z1231 Encounter for screening mammogram for malignant neoplasm of breast: Secondary | ICD-10-CM

## 2016-01-26 ENCOUNTER — Encounter: Payer: BC Managed Care – PPO | Admitting: Obstetrics and Gynecology

## 2016-01-30 ENCOUNTER — Encounter: Payer: Self-pay | Admitting: *Deleted

## 2016-02-03 ENCOUNTER — Encounter
Admission: RE | Disposition: A | Discharge status: Home / Self Care | Payer: Self-pay | Source: Ambulatory Visit | Attending: Gastroenterology

## 2016-02-03 ENCOUNTER — Ambulatory Visit
Admission: RE | Admit: 2016-02-03 | Discharge: 2016-02-03 | Disposition: A | Discharge status: Home / Self Care | Payer: BC Managed Care – PPO | Source: Ambulatory Visit | Attending: Gastroenterology | Admitting: Gastroenterology

## 2016-02-03 ENCOUNTER — Ambulatory Visit: Payer: BC Managed Care – PPO | Admitting: Anesthesiology

## 2016-02-03 DIAGNOSIS — Z79899 Other long term (current) drug therapy: Secondary | ICD-10-CM | POA: Diagnosis not present

## 2016-02-03 DIAGNOSIS — Z8249 Family history of ischemic heart disease and other diseases of the circulatory system: Secondary | ICD-10-CM | POA: Insufficient documentation

## 2016-02-03 DIAGNOSIS — I10 Essential (primary) hypertension: Secondary | ICD-10-CM | POA: Insufficient documentation

## 2016-02-03 DIAGNOSIS — Z888 Allergy status to other drugs, medicaments and biological substances status: Secondary | ICD-10-CM | POA: Diagnosis not present

## 2016-02-03 DIAGNOSIS — E785 Hyperlipidemia, unspecified: Secondary | ICD-10-CM | POA: Diagnosis not present

## 2016-02-03 DIAGNOSIS — Z1211 Encounter for screening for malignant neoplasm of colon: Secondary | ICD-10-CM | POA: Insufficient documentation

## 2016-02-03 DIAGNOSIS — E559 Vitamin D deficiency, unspecified: Secondary | ICD-10-CM | POA: Insufficient documentation

## 2016-02-03 DIAGNOSIS — Z803 Family history of malignant neoplasm of breast: Secondary | ICD-10-CM | POA: Diagnosis not present

## 2016-02-03 DIAGNOSIS — Z882 Allergy status to sulfonamides status: Secondary | ICD-10-CM | POA: Insufficient documentation

## 2016-02-03 DIAGNOSIS — Z9851 Tubal ligation status: Secondary | ICD-10-CM | POA: Insufficient documentation

## 2016-02-03 DIAGNOSIS — K640 First degree hemorrhoids: Secondary | ICD-10-CM | POA: Insufficient documentation

## 2016-02-03 DIAGNOSIS — Z833 Family history of diabetes mellitus: Secondary | ICD-10-CM | POA: Diagnosis not present

## 2016-02-03 DIAGNOSIS — Z9109 Other allergy status, other than to drugs and biological substances: Secondary | ICD-10-CM | POA: Insufficient documentation

## 2016-02-03 HISTORY — PX: COLONOSCOPY WITH PROPOFOL: SHX5780

## 2016-02-03 SURGERY — COLONOSCOPY WITH PROPOFOL
Anesthesia: Monitor Anesthesia Care | Wound class: Contaminated

## 2016-02-03 MED ORDER — LACTATED RINGERS IV SOLN
INTRAVENOUS | Status: DC
  Administered 2016-02-03: 11:00:00 via INTRAVENOUS

## 2016-02-03 MED ORDER — ACETAMINOPHEN 160 MG/5ML PO SOLN: 325.0000 mg | ORAL | Status: DC | PRN

## 2016-02-03 MED ORDER — LIDOCAINE HCL (CARDIAC) 20 MG/ML IV SOLN
INTRAVENOUS | Status: DC | PRN
  Administered 2016-02-03: 50 mg via INTRAVENOUS

## 2016-02-03 MED ORDER — OXYCODONE HCL 5 MG/5ML PO SOLN: 5.0000 mg | Freq: Once | ORAL | Status: DC | PRN

## 2016-02-03 MED ORDER — STERILE WATER FOR IRRIGATION IR SOLN
Status: DC | PRN
  Administered 2016-02-03: 12:00:00

## 2016-02-03 MED ORDER — DEXAMETHASONE SODIUM PHOSPHATE 4 MG/ML IJ SOLN: 8.0000 mg | Freq: Once | INTRAMUSCULAR | Status: DC | PRN

## 2016-02-03 MED ORDER — OXYCODONE HCL 5 MG PO TABS: 5.0000 mg | ORAL_TABLET | Freq: Once | ORAL | Status: DC | PRN

## 2016-02-03 MED ORDER — FENTANYL CITRATE (PF) 100 MCG/2ML IJ SOLN: 25.0000 ug | INTRAMUSCULAR | Status: DC | PRN

## 2016-02-03 MED ORDER — ACETAMINOPHEN 325 MG PO TABS: 325.0000 mg | ORAL_TABLET | ORAL | Status: DC | PRN

## 2016-02-03 MED ORDER — PROPOFOL 10 MG/ML IV BOLUS
INTRAVENOUS | Status: DC | PRN
  Administered 2016-02-03: 20 mg via INTRAVENOUS
  Administered 2016-02-03: 50 mg via INTRAVENOUS
  Administered 2016-02-03: 70 mg via INTRAVENOUS
  Administered 2016-02-03 (×2): 30 mg via INTRAVENOUS
  Administered 2016-02-03 (×4): 20 mg via INTRAVENOUS

## 2016-02-03 SURGICAL SUPPLY — 23 items
CANISTER SUCT 1200ML W/VALVE (MISCELLANEOUS) ×2 IMPLANT
CLIP HMST 235XBRD CATH ROT (MISCELLANEOUS) IMPLANT
CLIP RESOLUTION 360 11X235 (MISCELLANEOUS)
FCP ESCP3.2XJMB 240X2.8X (MISCELLANEOUS)
FORCEPS BIOP RAD 4 LRG CAP 4 (CUTTING FORCEPS) IMPLANT
FORCEPS BIOP RJ4 240 W/NDL (MISCELLANEOUS)
FORCEPS ESCP3.2XJMB 240X2.8X (MISCELLANEOUS) IMPLANT
GOWN CVR UNV OPN BCK APRN NK (MISCELLANEOUS) ×2 IMPLANT
GOWN ISOL THUMB LOOP REG UNIV (MISCELLANEOUS) ×2
INJECTOR VARIJECT VIN23 (MISCELLANEOUS) IMPLANT
KIT DEFENDO VALVE AND CONN (KITS) IMPLANT
KIT ENDO PROCEDURE OLY (KITS) ×2 IMPLANT
MARKER SPOT ENDO TATTOO 5ML (MISCELLANEOUS) IMPLANT
PAD GROUND ADULT SPLIT (MISCELLANEOUS) IMPLANT
PROBE APC STR FIRE (PROBE) IMPLANT
RETRIEVER NET ROTH 2.5X230 LF (MISCELLANEOUS) ×2 IMPLANT
SNARE SHORT THROW 13M SML OVAL (MISCELLANEOUS) IMPLANT
SNARE SHORT THROW 30M LRG OVAL (MISCELLANEOUS) IMPLANT
SNARE SNG USE RND 15MM (INSTRUMENTS) IMPLANT
SPOT EX ENDOSCOPIC TATTOO (MISCELLANEOUS)
TRAP ETRAP POLY (MISCELLANEOUS) IMPLANT
VARIJECT INJECTOR VIN23 (MISCELLANEOUS)
WATER STERILE IRR 250ML POUR (IV SOLUTION) ×2 IMPLANT

## 2016-02-03 NOTE — Op Note (Signed)
[facility] Gastroenterology Patient Name: Karen Donaldson Procedure Date: 02/03/2016 12:10 PM MRN: [id number] Account #: [id number] Date of Birth: February 07, 1966 Admit Type: Outpatient Age: 50 Room: [facility] OR ROOM 01 Gender: Female Note Status: Finalized Procedure:            Colonoscopy Indications:          Screening for colorectal malignant neoplasm Providers:            Lucilla Lame MD, MD Referring MD:         Bethena Roys. Sowles, MD (Referring MD) Medicines:            Propofol per Anesthesia Complications:        No immediate complications. Procedure:            Pre-Anesthesia Assessment:                       - Prior to the procedure, a History and Physical was                        performed, and patient medications and allergies were                        reviewed. The patient's tolerance of previous                        anesthesia was also reviewed. The risks and benefits of                        the procedure and the sedation options and risks were                        discussed with the patient. All questions were                        answered, and informed consent was obtained. Prior                        Anticoagulants: The patient has taken no previous                        anticoagulant or antiplatelet agents. ASA Grade                        Assessment: II - A patient with mild systemic disease.                        After reviewing the risks and benefits, the patient was                        deemed in satisfactory condition to undergo the                        procedure.                       After obtaining informed consent, the colonoscope was                        passed under direct vision. Throughout the procedure,  the patient's blood pressure, pulse, and oxygen                        saturations were monitored continuously. The Olympus                        CF-HQ190L Colonoscope (S#. [id number]) was introduced                       through the anus and advanced to the the cecum,                        identified by appendiceal orifice and ileocecal valve.                        The colonoscopy was performed without difficulty. The                        patient tolerated the procedure well. The quality of                        the bowel preparation was excellent. Findings:      The perianal and digital rectal examinations were normal.      Non-bleeding internal hemorrhoids were found during retroflexion. The       hemorrhoids were Grade I (internal hemorrhoids that do not prolapse). Impression:           - Non-bleeding internal hemorrhoids.                       - No specimens collected. Recommendation:       - Repeat colonoscopy in 10 years for screening unless                        any change in family history or lower GI problems. Procedure Code(s):    --- Professional ---                       [phone], Colonoscopy, flexible; diagnostic, including                        collection of specimen(s) by brushing or washing, when                        performed (separate procedure) Diagnosis Code(s):    --- Professional ---                       Z12.11, Encounter for screening for malignant neoplasm                        of colon CPT copyright 2016 [employer] Association. All rights reserved. The codes documented in this report are preliminary and upon coder review may  be revised to meet current compliance requirements. Lucilla Lame MD, MD 02/03/2016 12:37:35 PM This report has been signed electronically. Number of Addenda: 0 Note Initiated On: 02/03/2016 12:10 PM Scope Withdrawal Time: 0 hours 7 minutes 49 seconds  Total Procedure Duration: 0 hours 11 minutes 33 seconds       [facility]

## 2016-02-03 NOTE — Discharge Instructions (Signed)

## 2016-02-03 NOTE — Anesthesia Postprocedure Evaluation (Signed)
Anesthesia Post Note  Patient: Karen Donaldson  Procedure(s) Performed: Procedure(s) (LRB): COLONOSCOPY WITH PROPOFOL (N/A)  Patient location during evaluation: PACU Anesthesia Type: MAC Level of consciousness: awake and alert Pain management: pain level controlled Vital Signs Assessment: post-procedure vital signs reviewed and stable Respiratory status: spontaneous breathing, nonlabored ventilation and respiratory function stable Cardiovascular status: blood pressure returned to baseline and stable Postop Assessment: no signs of nausea or vomiting Anesthetic complications: no    DANIEL D KOVACS

## 2016-02-03 NOTE — Anesthesia Preprocedure Evaluation (Signed)
Anesthesia Evaluation  Patient identified by MRN, date of birth, ID band Patient awake    Reviewed: Allergy & Precautions, H&P , NPO status , Patient's Chart, lab work & pertinent test results, reviewed documented beta blocker date and time   Airway Mallampati: II  TM Distance: >3 FB Neck ROM: full    Dental no notable dental hx.    Pulmonary neg pulmonary ROS,    Pulmonary exam normal breath sounds clear to auscultation       Cardiovascular Exercise Tolerance: Good hypertension, On Medications  Rhythm:regular Rate:Normal     Neuro/Psych negative neurological ROS  negative psych ROS   GI/Hepatic negative GI ROS, Neg liver ROS,   Endo/Other  negative endocrine ROS  Renal/GU negative Renal ROS  negative genitourinary   Musculoskeletal   Abdominal   Peds  Hematology negative hematology ROS (+)   Anesthesia Other Findings   Reproductive/Obstetrics negative OB ROS                             Anesthesia Physical Anesthesia Plan  ASA: II  Anesthesia Plan: MAC   Post-op Pain Management:    Induction:   Airway Management Planned:   Additional Equipment:   Intra-op Plan:   Post-operative Plan:   Informed Consent: I have reviewed the patients History and Physical, chart, labs and discussed the procedure including the risks, benefits and alternatives for the proposed anesthesia with the patient or authorized representative who has indicated his/her understanding and acceptance.     Plan Discussed with: CRNA  Anesthesia Plan Comments:         Anesthesia Quick Evaluation

## 2016-02-03 NOTE — H&P (Signed)
Lucilla Lame, MD [address] [place], [place] [address] Phone: [phone] Fax : [phone]  Primary Care Physician:  Loistine Chance, MD Primary Gastroenterologist:  Dr. Allen Norris  Pre-Procedure History & Physical: HPI:  Karen Donaldson is a 50 y.o. female is here for a screening colonoscopy.   Past Medical History  Diagnosis Date  . Vitamin D deficiency   . Hyperglycemia   . Dyslipidemia   . Stress incontinence   . Hypertension   . Hypertrophy, uterus   . Allergy   . High risk HPV infection   . Abnormal Pap smear of cervix 01/2015    ascus/pos    Past Surgical History  Procedure Laterality Date  . Tubal ligation    . Cesarean section  2009, 2012    Prior to Admission medications   Medication Sig Start Date End Date Taking? Authorizing Provider  Calcium Carbonate-Vitamin D 600-400 MG-UNIT per chew tablet Chew 1 tablet by mouth daily.   Yes Historical Provider, MD  cetirizine (ZYRTEC) 10 MG tablet Take 1 tablet (10 mg total) by mouth daily. 08/11/15  Yes Steele Sizer, MD  diltiazem (CARDIZEM CD) 180 MG 24 hr capsule Take 1 capsule (180 mg total) by mouth daily. 08/11/15  Yes Steele Sizer, MD  FIBER PO Take by mouth daily as needed.   Yes Historical Provider, MD  hydrochlorothiazide (HYDRODIURIL) 12.5 MG tablet Take 1 tablet (12.5 mg total) by mouth every morning. 08/11/15  Yes Steele Sizer, MD  montelukast (SINGULAIR) 10 MG tablet Take 1 tablet (10 mg total) by mouth daily. 08/11/15  Yes Steele Sizer, MD  MULTIPLE VITAMIN PO Take 1 tablet by mouth daily. 01/12/09  Yes Historical Provider, MD    Allergies as of 08/12/2015 - Review Complete 08/12/2015  Allergen Reaction Noted  . Ace inhibitors  02/06/2015  . Sulfamethoxazole-trimethoprim  02/06/2015    Family History  Problem Relation Age of Onset  . Hypertension Mother   . Diabetes Mother   . Hypertension Father   . Breast cancer Maternal Aunt   . Ovarian cancer Neg Hx   . Colon cancer Neg  Hx   . Heart disease Neg Hx     Social History   Social History  . Marital Status: Married    Spouse Name: N/A  . Number of Children: N/A  . Years of Education: N/A   Occupational History  . Not on file.   Social History Main Topics  . Smoking status: Never Smoker   . Smokeless tobacco: Never Used  . Alcohol Use: No  . Drug Use: No  . Sexual Activity:    Partners: Male    Birth Control/ Protection: Surgical   Other Topics Concern  . Not on file   Social History Narrative    Review of Systems: See HPI, otherwise negative ROS  Physical Exam: Ht 5' 1"  (1.549 m)  Wt 190 lb (86.183 kg)  BMI 35.92 kg/m2  LMP 01/15/2016 (Exact Date) General:   Alert,  pleasant and cooperative in NAD Head:  Normocephalic and atraumatic. Neck:  Supple; no masses or thyromegaly. Lungs:  Clear throughout to auscultation.    Heart:  Regular rate and rhythm. Abdomen:  Soft, nontender and nondistended. Normal bowel sounds, without guarding, and without rebound.   Neurologic:  Alert and  oriented x4;  grossly normal neurologically.  Impression/Plan: Karen Donaldson is now here to undergo a screening colonoscopy.  Risks, benefits, and alternatives regarding colonoscopy have been reviewed with the patient.  Questions  have been answered.  All parties agreeable.

## 2016-02-03 NOTE — Transfer of Care (Signed)
Immediate Anesthesia Transfer of Care Note  Patient: Karen Donaldson  Procedure(s) Performed: Procedure(s): COLONOSCOPY WITH PROPOFOL (N/A)  Patient Location: PACU  Anesthesia Type: MAC  Level of Consciousness: awake, alert  and patient cooperative  Airway and Oxygen Therapy: Patient Spontanous Breathing and Patient connected to supplemental oxygen  Post-op Assessment: Post-op Vital signs reviewed, Patient's Cardiovascular Status Stable, Respiratory Function Stable, Patent Airway and No signs of Nausea or vomiting  Post-op Vital Signs: Reviewed and stable  Complications: No apparent anesthesia complications

## 2016-02-03 NOTE — Anesthesia Procedure Notes (Signed)
Procedure Name: MAC Performed by: Natelie Ostrosky Pre-anesthesia Checklist: Patient identified, Emergency Drugs available, Suction available, Timeout performed and Patient being monitored Patient Re-evaluated:Patient Re-evaluated prior to inductionOxygen Delivery Method: Nasal cannula Placement Confirmation: positive ETCO2       

## 2016-02-06 ENCOUNTER — Encounter: Payer: Self-pay | Admitting: Gastroenterology

## 2016-02-08 ENCOUNTER — Encounter: Payer: Self-pay | Admitting: Family Medicine

## 2016-02-08 ENCOUNTER — Ambulatory Visit (INDEPENDENT_AMBULATORY_CARE_PROVIDER_SITE_OTHER): Payer: BC Managed Care – PPO | Admitting: Family Medicine

## 2016-02-08 VITALS — BP 122/74 | HR 90 | Temp 98.0°F | Resp 18 | Ht 61.0 in | Wt 194.0 lb

## 2016-02-08 DIAGNOSIS — N870 Mild cervical dysplasia: Secondary | ICD-10-CM

## 2016-02-08 DIAGNOSIS — R739 Hyperglycemia, unspecified: Secondary | ICD-10-CM

## 2016-02-08 DIAGNOSIS — E669 Obesity, unspecified: Secondary | ICD-10-CM

## 2016-02-08 DIAGNOSIS — E785 Hyperlipidemia, unspecified: Secondary | ICD-10-CM

## 2016-02-08 DIAGNOSIS — E559 Vitamin D deficiency, unspecified: Secondary | ICD-10-CM | POA: Diagnosis not present

## 2016-02-08 DIAGNOSIS — I10 Essential (primary) hypertension: Secondary | ICD-10-CM

## 2016-02-08 DIAGNOSIS — J309 Allergic rhinitis, unspecified: Secondary | ICD-10-CM

## 2016-02-08 DIAGNOSIS — J3089 Other allergic rhinitis: Secondary | ICD-10-CM

## 2016-02-08 DIAGNOSIS — J302 Other seasonal allergic rhinitis: Secondary | ICD-10-CM

## 2016-02-08 NOTE — Progress Notes (Signed)
Name: Karen Donaldson   MRN: [id number]    DOB: 1966/06/14   Date:02/08/2016       Progress Note  Subjective  Chief Complaint  Chief Complaint  Patient presents with  . Follow-up  . Dyslipidemia  . Hypertension  . Allergic Rhinitis   . Constipation  . ASCUS    HPI  HTN: patient states she has been compliant with her medication, no chest pain, no palpitation, no swelling, states constipation has improved  Chronic constipation: she states she has constipation - she has a bowel movement every 2 days  it has been since childhood and no longer has to strain. She states fiber supplements eating more fruit has helped. She sees blood in her stools at times, and colonoscopy showed hemorrhoids.   CIN I  with HPV: seen by Dr. Marcelline Mates last year , had colposcopy, and is following up in August 2017  AR: she has intermittent symptoms, she has episodes of nasal congestion, and a dry cough, but is doing better with singulair. No wheezing or SOB  Hyperlipidemia: she is not taking medication, trying to eat better  Hyperglycemia: she denies polyphagia, polyuria or polydipsia.   Obesity: her weight is stable, she states she would like to lose weight and is trying to eat more fruit and vegetables, trying to exercise 2-3 times weekly. Advised to increase to 5 days week.   Patient Active Problem List   Diagnosis Date Noted  . Special screening for malignant neoplasms, colon   . CIN I (cervical intraepithelial neoplasia I) 02/22/2015  . Benign essential HTN 02/06/2015  . Dyslipidemia 02/06/2015  . Blood glucose elevated 02/06/2015  . Obesity (BMI 30-39.9) 02/06/2015  . Perennial allergic rhinitis with seasonal variation 02/06/2015  . Stress incontinence 02/06/2015  . Vitamin D deficiency 02/06/2015  . HPV (human papilloma virus) infection 02/05/2014    Past Surgical History  Procedure Laterality Date  . Tubal ligation    . Cesarean section  2009, 2012  . Colonoscopy with propofol N/A  02/03/2016    Procedure: COLONOSCOPY WITH PROPOFOL;  Surgeon: Lucilla Lame, MD;  Location: [facility];  Service: Endoscopy;  Laterality: N/A;    Family History  Problem Relation Age of Onset  . Hypertension Mother   . Diabetes Mother   . Hypertension Father   . Breast cancer Maternal Aunt   . Ovarian cancer Neg Hx   . Colon cancer Neg Hx   . Heart disease Neg Hx     Social History   Social History  . Marital Status: Married    Spouse Name: N/A  . Number of Children: N/A  . Years of Education: N/A   Occupational History  . Not on file.   Social History Main Topics  . Smoking status: Never Smoker   . Smokeless tobacco: Never Used  . Alcohol Use: No  . Drug Use: No  . Sexual Activity:    Partners: Male    Birth Control/ Protection: Surgical   Other Topics Concern  . Not on file   Social History Narrative     Current outpatient prescriptions:  .  Calcium Carbonate-Vitamin D 600-400 MG-UNIT per chew tablet, Chew 1 tablet by mouth daily., Disp: , Rfl:  .  cetirizine (ZYRTEC) 10 MG tablet, Take 1 tablet (10 mg total) by mouth daily., Disp: 30 tablet, Rfl: 0 .  diltiazem (CARDIZEM CD) 180 MG 24 hr capsule, Take 1 capsule (180 mg total) by mouth daily., Disp: 90 capsule, Rfl: 1 .  FIBER PO, Take by mouth daily as needed., Disp: , Rfl:  .  fluticasone (FLONASE) 50 MCG/ACT nasal spray, INHALE 2 SPRAY BY INTRANASAL ROUTE EVERY DAY IN EACH NOSTRIL, Disp: , Rfl: 0 .  hydrochlorothiazide (HYDRODIURIL) 12.5 MG tablet, Take 1 tablet (12.5 mg total) by mouth every morning., Disp: 90 tablet, Rfl: 1 .  montelukast (SINGULAIR) 10 MG tablet, Take 1 tablet (10 mg total) by mouth daily., Disp: 90 tablet, Rfl: 1 .  MULTIPLE VITAMIN PO, Take 1 tablet by mouth daily., Disp: , Rfl:   Allergies  Allergen Reactions  . Ace Inhibitors   . Sulfamethoxazole-Trimethoprim Rash     ROS  Constitutional: Negative for fever or weight change.  Respiratory: Negative for cough and shortness  of breath.   Cardiovascular: Negative for chest pain or palpitations.  Gastrointestinal: Negative for abdominal pain, no bowel changes.  Musculoskeletal: Negative for gait problem or joint swelling.  Skin: Negative for rash.  Neurological: Negative for dizziness or headache.  No other specific complaints in a complete review of systems (except as listed in HPI above).  Objective  Filed Vitals:   02/08/16 0859  BP: 122/74  Pulse: 90  Temp: 98 F (36.7 C)  TempSrc: Oral  Resp: 18  Height: 5' 1"  (1.549 m)  Weight: 194 lb (87.998 kg)  SpO2: 97%    Body mass index is 36.67 kg/(m^2).  Physical Exam  Constitutional: Patient appears well-developed and well-nourished. Obese No distress.  HEENT: head atraumatic, normocephalic, pupils equal and reactive to light, neck supple, throat within normal limits Cardiovascular: Normal rate, regular rhythm and normal heart sounds.  No murmur heard. No BLE edema. Pulmonary/Chest: Effort normal and breath sounds normal. No respiratory distress. Abdominal: Soft.  There is no tenderness. Psychiatric: Patient has a normal mood and affect. behavior is normal. Judgment and thought content normal.   PHQ2/9: Depression screen [facility] 2/9 02/08/2016 10/21/2015 08/11/2015 02/08/2015  Decreased Interest 0 0 0 0  Down, Depressed, Hopeless 0 0 0 0  PHQ - 2 Score 0 0 0 0    Fall Risk: Fall Risk  02/08/2016 10/21/2015 08/11/2015 02/08/2015  Falls in the past year? No No No No    Functional Status Survey: Is the patient deaf or have difficulty hearing?: No Does the patient have difficulty seeing, even when wearing glasses/contacts?: No Does the patient have difficulty concentrating, remembering, or making decisions?: No Does the patient have difficulty walking or climbing stairs?: No Does the patient have difficulty dressing or bathing?: No Does the patient have difficulty doing errands alone such as visiting a doctor's office or shopping?: No    Assessment &  Plan  1. Dyslipidemia  - Lipid panel  2. Benign essential HTN  - COMPLETE METABOLIC PANEL WITH GFR  3. CIN I (cervical intraepithelial neoplasia I)  Keep follow up with Dr. Marcelline Mates  4. Perennial allergic rhinitis with seasonal variation  Take medication prn  5. Obesity (BMI 30-39.9)  Discussed with the patient the risk posed by an increased BMI. Discussed importance of portion control, calorie counting and at least 150 minutes of physical activity weekly. Avoid sweet beverages and drink more water. Eat at least 6 servings of fruit and vegetables daily   6. Vitamin D deficiency  - VITAMIN D 25 Hydroxy (Vit-D Deficiency, Fractures)  7. Blood glucose elevated  - Hemoglobin A1c

## 2016-02-10 LAB — COMPLETE METABOLIC PANEL WITH GFR
ALBUMIN: 4.1 g/dL (ref 3.6–5.1)
ALK PHOS: 65 U/L (ref 33–130)
ALT: 10 U/L (ref 6–29)
AST: 13 U/L (ref 10–35)
BILIRUBIN TOTAL: 0.6 mg/dL (ref 0.2–1.2)
BUN: 17 mg/dL (ref 7–25)
CO2: 22 mmol/L (ref 20–31)
Calcium: 9.3 mg/dL (ref 8.6–10.4)
Chloride: 101 mmol/L (ref 98–110)
Creat: 0.88 mg/dL (ref 0.50–1.05)
GFR, EST AFRICAN AMERICAN: 89 mL/min (ref >=60)
GFR, EST NON AFRICAN AMERICAN: 77 mL/min (ref >=60)
Glucose, Bld: 94 mg/dL (ref 65–99)
Potassium: 4.3 mmol/L (ref 3.5–5.3)
Sodium: 136 mmol/L (ref 135–146)
TOTAL PROTEIN: 6.8 g/dL (ref 6.1–8.1)

## 2016-02-10 LAB — LIPID PANEL
CHOL/HDL RATIO: 3.5 ratio (ref <=5.0)
Cholesterol: 202 mg/dL — ABNORMAL HIGH (ref 125–200)
HDL: 58 mg/dL (ref >=46)
LDL Cholesterol: 125 mg/dL (ref <130)
TRIGLYCERIDES: 94 mg/dL (ref <150)
VLDL: 19 mg/dL (ref <30)

## 2016-02-10 LAB — HEMOGLOBIN A1C
HEMOGLOBIN A1C: 5.8 % — AB (ref <5.7)
MEAN PLASMA GLUCOSE: 120 mg/dL

## 2016-02-11 LAB — VITAMIN D 25 HYDROXY (VIT D DEFICIENCY, FRACTURES): VIT D 25 HYDROXY: 31 ng/mL (ref 30–100)

## 2016-02-22 ENCOUNTER — Ambulatory Visit
Admission: RE | Admit: 2016-02-22 | Discharge: 2016-02-22 | Disposition: A | Discharge status: Home / Self Care | Payer: BC Managed Care – PPO | Source: Ambulatory Visit | Attending: Family Medicine | Admitting: Family Medicine

## 2016-02-22 ENCOUNTER — Other Ambulatory Visit: Payer: Self-pay | Admitting: Family Medicine

## 2016-02-22 DIAGNOSIS — Z1231 Encounter for screening mammogram for malignant neoplasm of breast: Secondary | ICD-10-CM | POA: Insufficient documentation

## 2016-02-23 ENCOUNTER — Encounter: Payer: Self-pay | Admitting: Obstetrics and Gynecology

## 2016-02-23 ENCOUNTER — Ambulatory Visit (INDEPENDENT_AMBULATORY_CARE_PROVIDER_SITE_OTHER): Payer: BC Managed Care – PPO | Admitting: Obstetrics and Gynecology

## 2016-02-23 VITALS — BP 121/83 | HR 66 | Ht 61.0 in | Wt 192.9 lb

## 2016-02-23 DIAGNOSIS — N870 Mild cervical dysplasia: Secondary | ICD-10-CM

## 2016-02-23 DIAGNOSIS — Z01419 Encounter for gynecological examination (general) (routine) without abnormal findings: Secondary | ICD-10-CM

## 2016-02-23 DIAGNOSIS — N951 Menopausal and female climacteric states: Secondary | ICD-10-CM | POA: Diagnosis not present

## 2016-02-23 DIAGNOSIS — E785 Hyperlipidemia, unspecified: Secondary | ICD-10-CM

## 2016-02-23 DIAGNOSIS — I10 Essential (primary) hypertension: Secondary | ICD-10-CM | POA: Diagnosis not present

## 2016-02-23 NOTE — Patient Instructions (Signed)
Health Maintenance, Female Adopting a healthy lifestyle and getting preventive care can go a long way to promote health and wellness. Talk with your health care provider about what schedule of regular examinations is right for you. This is a good chance for you to check in with your provider about disease prevention and staying healthy. In between checkups, there are plenty of things you can do on your own. Experts have done a lot of research about which lifestyle changes and preventive measures are most likely to keep you healthy. Ask your health care provider for more information. WEIGHT AND DIET  Eat a healthy diet  Be sure to include plenty of vegetables, fruits, low-fat dairy products, and lean protein.  Do not eat a lot of foods high in solid fats, added sugars, or salt.  Get regular exercise. This is one of the most important things you can do for your health.  Most adults should exercise for at least 150 minutes each week. The exercise should increase your heart rate and make you sweat (moderate-intensity exercise).  Most adults should also do strengthening exercises at least twice a week. This is in addition to the moderate-intensity exercise.  Maintain a healthy weight  Body mass index (BMI) is a measurement that can be used to identify possible weight problems. It estimates body fat based on height and weight. Your health care provider can help determine your BMI and help you achieve or maintain a healthy weight.  For females 20 years of age and older:   A BMI below 18.5 is considered underweight.  A BMI of 18.5 to 24.9 is normal.  A BMI of 25 to 29.9 is considered overweight.  A BMI of 30 and above is considered obese.  Watch levels of cholesterol and blood lipids  You should start having your blood tested for lipids and cholesterol at 50 years of age, then have this test every 5 years.  You may need to have your cholesterol levels checked more often if:  Your lipid  or cholesterol levels are high.  You are older than 50 years of age.  You are at high risk for heart disease.  CANCER SCREENING   Lung Cancer  Lung cancer screening is recommended for adults 55-80 years old who are at high risk for lung cancer because of a history of smoking.  A yearly low-dose CT scan of the lungs is recommended for people who:  Currently smoke.  Have quit within the past 15 years.  Have at least a 30-pack-year history of smoking. A pack year is smoking an average of one pack of cigarettes a day for 1 year.  Yearly screening should continue until it has been 15 years since you quit.  Yearly screening should stop if you develop a health problem that would prevent you from having lung cancer treatment.  Breast Cancer  Practice breast self-awareness. This means understanding how your breasts normally appear and feel.  It also means doing regular breast self-exams. Let your health care provider know about any changes, no matter how small.  If you are in your 20s or 30s, you should have a clinical breast exam (CBE) by a health care provider every 1-3 years as part of a regular health exam.  If you are 40 or older, have a CBE every year. Also consider having a breast X-ray (mammogram) every year.  If you have a family history of breast cancer, talk to your health care provider about genetic screening.  If you   are at high risk for breast cancer, talk to your health care provider about having an MRI and a mammogram every year.  Breast cancer gene (BRCA) assessment is recommended for women who have family members with BRCA-related cancers. BRCA-related cancers include:  Breast.  Ovarian.  Tubal.  Peritoneal cancers.  Results of the assessment will determine the need for genetic counseling and BRCA1 and BRCA2 testing. Cervical Cancer Your health care provider may recommend that you be screened regularly for cancer of the pelvic organs (ovaries, uterus, and  vagina). This screening involves a pelvic examination, including checking for microscopic changes to the surface of your cervix (Pap test). You may be encouraged to have this screening done every 3 years, beginning at age 21.  For women ages 30-65, health care providers may recommend pelvic exams and Pap testing every 3 years, or they may recommend the Pap and pelvic exam, combined with testing for human papilloma virus (HPV), every 5 years. Some types of HPV increase your risk of cervical cancer. Testing for HPV may also be done on women of any age with unclear Pap test results.  Other health care providers may not recommend any screening for nonpregnant women who are considered low risk for pelvic cancer and who do not have symptoms. Ask your health care provider if a screening pelvic exam is right for you.  If you have had past treatment for cervical cancer or a condition that could lead to cancer, you need Pap tests and screening for cancer for at least 20 years after your treatment. If Pap tests have been discontinued, your risk factors (such as having a new sexual partner) need to be reassessed to determine if screening should resume. Some women have medical problems that increase the chance of getting cervical cancer. In these cases, your health care provider may recommend more frequent screening and Pap tests. Colorectal Cancer  This type of cancer can be detected and often prevented.  Routine colorectal cancer screening usually begins at 50 years of age and continues through 50 years of age.  Your health care provider may recommend screening at an earlier age if you have risk factors for colon cancer.  Your health care provider may also recommend using home test kits to check for hidden blood in the stool.  A small camera at the end of a tube can be used to examine your colon directly (sigmoidoscopy or colonoscopy). This is done to check for the earliest forms of colorectal  cancer.  Routine screening usually begins at age 50.  Direct examination of the colon should be repeated every 5-10 years through 50 years of age. However, you may need to be screened more often if early forms of precancerous polyps or small growths are found. Skin Cancer  Check your skin from head to toe regularly.  Tell your health care provider about any new moles or changes in moles, especially if there is a change in a mole's shape or color.  Also tell your health care provider if you have a mole that is larger than the size of a pencil eraser.  Always use sunscreen. Apply sunscreen liberally and repeatedly throughout the day.  Protect yourself by wearing long sleeves, pants, a wide-brimmed hat, and sunglasses whenever you are outside. HEART DISEASE, DIABETES, AND HIGH BLOOD PRESSURE   High blood pressure causes heart disease and increases the risk of stroke. High blood pressure is more likely to develop in:  People who have blood pressure in the high end   of the normal range (130-139/85-89 mm Hg).  People who are overweight or obese.  People who are African American.  If you are 38-23 years of age, have your blood pressure checked every 3-5 years. If you are 61 years of age or older, have your blood pressure checked every year. You should have your blood pressure measured twice--once when you are at a hospital or clinic, and once when you are not at a hospital or clinic. Record the average of the two measurements. To check your blood pressure when you are not at a hospital or clinic, you can use:  An automated blood pressure machine at a pharmacy.  A home blood pressure monitor.  If you are between 45 years and 39 years old, ask your health care provider if you should take aspirin to prevent strokes.  Have regular diabetes screenings. This involves taking a blood sample to check your fasting blood sugar level.  If you are at a normal weight and have a low risk for diabetes,  have this test once every three years after 50 years of age.  If you are overweight and have a high risk for diabetes, consider being tested at a younger age or more often. PREVENTING INFECTION  Hepatitis B  If you have a higher risk for hepatitis B, you should be screened for this virus. You are considered at high risk for hepatitis B if:  You were born in a country where hepatitis B is common. Ask your health care provider which countries are considered high risk.  Your parents were born in a high-risk country, and you have not been immunized against hepatitis B (hepatitis B vaccine).  You have HIV or AIDS.  You use needles to inject street drugs.  You live with someone who has hepatitis B.  You have had sex with someone who has hepatitis B.  You get hemodialysis treatment.  You take certain medicines for conditions, including cancer, organ transplantation, and autoimmune conditions. Hepatitis C  Blood testing is recommended for:  Everyone born from [birth year] through 1965.  Anyone with known risk factors for hepatitis C. Sexually transmitted infections (STIs)  You should be screened for sexually transmitted infections (STIs) including gonorrhea and chlamydia if:  You are sexually active and are younger than 50 years of age.  You are older than 50 years of age and your health care provider tells you that you are at risk for this type of infection.  Your sexual activity has changed since you were last screened and you are at an increased risk for chlamydia or gonorrhea. Ask your health care provider if you are at risk.  If you do not have HIV, but are at risk, it may be recommended that you take a prescription medicine daily to prevent HIV infection. This is called pre-exposure prophylaxis (PrEP). You are considered at risk if:  You are sexually active and do not regularly use condoms or know the HIV status of your partner(s).  You take drugs by injection.  You are sexually  active with a partner who has HIV. Talk with your health care provider about whether you are at high risk of being infected with HIV. If you choose to begin PrEP, you should first be tested for HIV. You should then be tested every 3 months for as long as you are taking PrEP.  PREGNANCY   If you are premenopausal and you may become pregnant, ask your health care provider about preconception counseling.  If you may  become pregnant, take 400 to 800 micrograms (mcg) of folic acid every day.  If you want to prevent pregnancy, talk to your health care provider about birth control (contraception). OSTEOPOROSIS AND MENOPAUSE   Osteoporosis is a disease in which the bones lose minerals and strength with aging. This can result in serious bone fractures. Your risk for osteoporosis can be identified using a bone density scan.  If you are 61 years of age or older, or if you are at risk for osteoporosis and fractures, ask your health care provider if you should be screened.  Ask your health care provider whether you should take a calcium or vitamin D supplement to lower your risk for osteoporosis.  Menopause may have certain physical symptoms and risks.  Hormone replacement therapy may reduce some of these symptoms and risks. Talk to your health care provider about whether hormone replacement therapy is right for you.  HOME CARE INSTRUCTIONS   Schedule regular health, dental, and eye exams.  Stay current with your immunizations.   Do not use any tobacco products including cigarettes, chewing tobacco, or electronic cigarettes.  If you are pregnant, do not drink alcohol.  If you are breastfeeding, limit how much and how often you drink alcohol.  Limit alcohol intake to no more than 1 drink per day for nonpregnant women. One drink equals 12 ounces of beer, 5 ounces of wine, or 1 ounces of hard liquor.  Do not use street drugs.  Do not share needles.  Ask your health care provider for help if  you need support or information about quitting drugs.  Tell your health care provider if you often feel depressed.  Tell your health care provider if you have ever been abused or do not feel safe at home.   This information is not intended to replace advice given to you by your health care provider. Make sure you discuss any questions you have with your health care provider.   Document Released: 01/22/2011 Document Revised: 07/30/2014 Document Reviewed: 06/10/2013 Elsevier Interactive Patient Education [employer].

## 2016-02-23 NOTE — Progress Notes (Signed)
GYNECOLOGY ANNUAL PHYSICAL EXAM PROGRESS NOTE  Subjective:    Karen Donaldson is a 50 y.o. G70P2002 African-American female who presents for an annual exam. The patient has no complaints today. The patient is sexually active. The patient wears seatbelts: yes. The patient participates in regular exercise: no. Has the patient ever been transfused or tattooed?: no. The patient reports that there is not domestic violence in her life.    Gynecologic History Menarche age: 89 Patient's last menstrual period was 02/13/2016 (approximate). Contraception: tubal ligation History of STI's: Denies  Last Pap: 01/29/2015. Results were: abnormal, ASCUS HR HPV+.  S/p Colposcopy with CIN I.  Denies h/o prior abnormal pap smears.  Last mammogram: 02/22/2016. Results were: normal Last Colonoscopy: 01/2016.  Results were normal.  Obstetric History   G2   P2   T2   P0   A0   L2    SAB0   TAB0   Ectopic0   Multiple0   Live Births2     # Outcome Date GA Lbr Len/2nd Weight Sex Delivery Anes PTL Lv  2 Term 2012   6 lb (2.722 kg) M CS-LTranv   LIV  1 Term 2009   5 lb 2.1 oz (2.327 kg) M CS-LTranv   LIV      Past Medical History:  Diagnosis Date  . Abnormal Pap smear of cervix 01/2015   ascus/pos  . Allergy   . Dyslipidemia   . High risk HPV infection   . Hyperglycemia   . Hypertension   . Hypertrophy, uterus   . Stress incontinence   . Vitamin D deficiency     Past Surgical History:  Procedure Laterality Date  . CESAREAN SECTION  2009, 2012  . COLONOSCOPY WITH PROPOFOL N/A 02/03/2016   Procedure: COLONOSCOPY WITH PROPOFOL;  Surgeon: Lucilla Lame, MD;  Location: [facility];  Service: Endoscopy;  Laterality: N/A;  . TUBAL LIGATION      Family History  Problem Relation Age of Onset  . Hypertension Mother   . Diabetes Mother   . Hypertension Father   . Breast cancer Maternal Aunt   . Ovarian cancer Neg Hx   . Colon cancer Neg Hx   . Heart disease Neg Hx     Social History    Social History  . Marital status: Married    Spouse name: N/A  . Number of children: N/A  . Years of education: N/A   Occupational History  . Not on file.   Social History Main Topics  . Smoking status: Never Smoker  . Smokeless tobacco: Never Used  . Alcohol use No  . Drug use: No  . Sexual activity: Yes    Partners: Male    Birth control/ protection: Surgical   Other Topics Concern  . Not on file   Social History Narrative  . No narrative on file    Current Outpatient Prescriptions on File Prior to Visit  Medication Sig Dispense Refill  . Calcium Carbonate-Vitamin D 600-400 MG-UNIT per chew tablet Chew 1 tablet by mouth daily.    . cetirizine (ZYRTEC) 10 MG tablet Take 1 tablet (10 mg total) by mouth daily. 30 tablet 0  . diltiazem (CARDIZEM CD) 180 MG 24 hr capsule Take 1 capsule (180 mg total) by mouth daily. 90 capsule 1  . FIBER PO Take by mouth daily as needed.    . fluticasone (FLONASE) 50 MCG/ACT nasal spray INHALE 2 SPRAY BY INTRANASAL ROUTE EVERY DAY IN EACH NOSTRIL  0  . hydrochlorothiazide (HYDRODIURIL) 12.5 MG tablet Take 1 tablet (12.5 mg total) by mouth every morning. 90 tablet 1  . montelukast (SINGULAIR) 10 MG tablet Take 1 tablet (10 mg total) by mouth daily. 90 tablet 1  . MULTIPLE VITAMIN PO Take 1 tablet by mouth daily.     No current facility-administered medications on file prior to visit.     Allergies  Allergen Reactions  . Ace Inhibitors   . Sulfamethoxazole-Trimethoprim Rash    Review of Systems Constitutional: negative for chills, fatigue, fevers and sweats Eyes: negative for irritation, redness and visual disturbance Ears, nose, mouth, throat, and face: negative for hearing loss, nasal congestion, snoring and tinnitus Respiratory: negative for asthma, cough, sputum Cardiovascular: negative for chest pain, dyspnea, exertional chest pressure/discomfort, irregular heart beat, palpitations and syncope Gastrointestinal: negative for  abdominal pain, change in bowel habits, nausea and vomiting Genitourinary: positive for irregular menstrual periods (notes occasionally skipping a month or 2).  Negative for genital lesions, sexual problems and vaginal discharge, dysuria and urinary incontinence Integument/breast: negative for breast lump, breast tenderness and nipple discharge Hematologic/lymphatic: negative for bleeding and easy bruising Musculoskeletal:negative for back pain and muscle weakness Neurological: negative for dizziness, headaches, vertigo and weakness Endocrine: negative for diabetic symptoms including polydipsia, polyuria and skin dryness Allergic/Immunologic: negative for hay fever and urticaria      Objective:  Blood pressure 121/83, pulse 66, height 5' 1"  (1.549 m), weight 192 lb 14.4 oz (87.5 kg), last menstrual period 02/13/2016. Body mass index is 36.45 kg/m.    General Appearance:    Alert, cooperative, no distress, appears stated age, moderately obese.   Head:    Normocephalic, without obvious abnormality, atraumatic  Eyes:    PERRL, conjunctiva/corneas clear, EOM's intact, both eyes  Ears:    Normal external ear canals, both ears  Nose:   Nares normal, septum midline, mucosa normal, no drainage or sinus tenderness  Throat:   Lips, mucosa, and tongue normal; teeth and gums normal  Neck:   Supple, symmetrical, trachea midline, no adenopathy; thyroid: no enlargement/tenderness/nodules; no carotid bruit or JVD  Back:     Symmetric, no curvature, ROM normal, no CVA tenderness  Lungs:     Clear to auscultation bilaterally, respirations unlabored  Chest Wall:    No tenderness or deformity   Heart:    Regular rate and rhythm, S1 and S2 normal, no murmur, rub or gallop  Breast Exam:    No tenderness, masses, or nipple abnormality  Abdomen:     Soft, non-tender, bowel sounds active all four quadrants, no masses, no organomegaly.    Genitalia:    Pelvic:external genitalia normal, vagina without lesions,  discharge, or tenderness, rectovaginal septum  normal. Cervix normal in appearance, no cervical motion tenderness, no adnexal masses or tenderness.  Uterus normal size, shape, mobile, regular contours, nontender.  Rectal:    Normal external sphincter.  No hemorrhoids appreciated. Internal exam not done.   Extremities:   Extremities normal, atraumatic, no cyanosis or edema  Pulses:   2+ and symmetric all extremities  Skin:   Skin color, texture, turgor normal, no rashes or lesions  Lymph nodes:   Cervical, supraclavicular, and axillary nodes normal  Neurologic:   CNII-XII intact, normal strength, sensation and reflexes throughout   .  Labs:  No results found for: WBC, HGB, HCT, MCV, PLT  Lab Results  Component Value Date   CREATININE 0.88 02/08/2016   BUN 17 02/08/2016   NA 136 02/08/2016  K 4.3 02/08/2016   CL 101 02/08/2016   CO2 22 02/08/2016    Lab Results  Component Value Date   ALT 10 02/08/2016   AST 13 02/08/2016   ALKPHOS 65 02/08/2016   BILITOT 0.6 02/08/2016    Lab Results  Component Value Date   CHOL 202 (H) 02/08/2016   CHOL 224 (H) 02/08/2015   CHOL 217 (A) 02/05/2014   Lab Results  Component Value Date   HDL 58 02/08/2016   HDL 54 02/08/2015   HDL 50 02/05/2014   Lab Results  Component Value Date   LDLCALC 125 02/08/2016   LDLCALC 141 (H) 02/08/2015   LDLCALC 144 02/05/2014   Lab Results  Component Value Date   TRIG 94 02/08/2016   TRIG 145 02/08/2015   TRIG 115 02/05/2014   Lab Results  Component Value Date   CHOLHDL 3.5 02/08/2016   CHOLHDL 4.1 02/08/2015   No results found for: LDLDIRECT   No results found for: TSH   Assessment:   Healthy female exam.   H/o CIN I Benign HTN Hyperlipidemia Perimenopausal status   Plan:    Has had annual labs by PCP.  Benign HTN and hyperlipidemia managed by PCP.  Breast self exam technique reviewed and patient encouraged to perform self-exam monthly. Contraception: tubal  ligation. Discussed healthy lifestyle modifications. Mammogram up to date (02/2016).  Colonoscopy up to date (01/2016) Pap smear performed today for h/o abnormal pap smear and CIN I on coloposcopy.   Perimenopausal status - discussed diagnoses and expectations in upcoming years.  Follow up in 1 year.   Rubie Maid, MD [facility]

## 2016-02-28 LAB — PAP IG AND HPV HIGH-RISK
HPV, HIGH-RISK: NEGATIVE
PAP Smear Comment: 0

## 2016-03-04 ENCOUNTER — Other Ambulatory Visit: Payer: Self-pay | Admitting: Family Medicine

## 2016-03-04 DIAGNOSIS — J302 Other seasonal allergic rhinitis: Secondary | ICD-10-CM

## 2016-03-04 DIAGNOSIS — J3089 Other allergic rhinitis: Secondary | ICD-10-CM

## 2016-03-04 DIAGNOSIS — I10 Essential (primary) hypertension: Secondary | ICD-10-CM

## 2016-03-05 ENCOUNTER — Telehealth: Payer: Self-pay

## 2016-03-05 NOTE — Telephone Encounter (Signed)
Called pt informed pt of neg PAP.

## 2016-03-05 NOTE — Telephone Encounter (Signed)
-----   Message from Rubie Maid, MD sent at 03/02/2016  9:37 AM EDT ----- Please inform of normal pap smear.

## 2016-08-13 ENCOUNTER — Encounter: Payer: Self-pay | Admitting: Family Medicine

## 2016-08-13 ENCOUNTER — Ambulatory Visit (INDEPENDENT_AMBULATORY_CARE_PROVIDER_SITE_OTHER): Payer: BC Managed Care – PPO | Admitting: Family Medicine

## 2016-08-13 VITALS — BP 118/74 | HR 84 | Temp 97.5°F | Resp 18 | Ht 61.0 in | Wt 193.2 lb

## 2016-08-13 DIAGNOSIS — E669 Obesity, unspecified: Secondary | ICD-10-CM | POA: Diagnosis not present

## 2016-08-13 DIAGNOSIS — K5909 Other constipation: Secondary | ICD-10-CM

## 2016-08-13 DIAGNOSIS — G8929 Other chronic pain: Secondary | ICD-10-CM | POA: Diagnosis not present

## 2016-08-13 DIAGNOSIS — E785 Hyperlipidemia, unspecified: Secondary | ICD-10-CM

## 2016-08-13 DIAGNOSIS — I10 Essential (primary) hypertension: Secondary | ICD-10-CM | POA: Diagnosis not present

## 2016-08-13 DIAGNOSIS — E559 Vitamin D deficiency, unspecified: Secondary | ICD-10-CM

## 2016-08-13 DIAGNOSIS — N870 Mild cervical dysplasia: Secondary | ICD-10-CM

## 2016-08-13 DIAGNOSIS — M25511 Pain in right shoulder: Secondary | ICD-10-CM | POA: Diagnosis not present

## 2016-08-13 DIAGNOSIS — Z23 Encounter for immunization: Secondary | ICD-10-CM | POA: Diagnosis not present

## 2016-08-13 DIAGNOSIS — J302 Other seasonal allergic rhinitis: Secondary | ICD-10-CM | POA: Diagnosis not present

## 2016-08-13 DIAGNOSIS — J3089 Other allergic rhinitis: Secondary | ICD-10-CM

## 2016-08-13 MED ORDER — MONTELUKAST SODIUM 10 MG PO TABS: 10.0000 mg | ORAL_TABLET | Freq: Every day | ORAL | 1 refills | Status: DC

## 2016-08-13 MED ORDER — DILTIAZEM HCL ER COATED BEADS 180 MG PO CP24: 180.0000 mg | ORAL_CAPSULE | Freq: Every day | ORAL | 1 refills | Status: DC

## 2016-08-13 MED ORDER — HYDROCHLOROTHIAZIDE 12.5 MG PO CAPS: 12.5000 mg | ORAL_CAPSULE | Freq: Every morning | ORAL | 1 refills | Status: DC

## 2016-08-13 NOTE — Patient Instructions (Signed)

## 2016-08-13 NOTE — Progress Notes (Addendum)
Name: Karen Donaldson   MRN: [id number]    DOB: April 11, 1966   Date:08/13/2016       Progress Note  Subjective  Chief Complaint  Chief Complaint  Patient presents with  . Medication Refill    6 month F/U  . Hyperlipidemia    Still eating healthier and not on taking any medication  . Hypertension    Denies any symptoms  . Allergic Rhinitis     Stable with over the counter medication  . Constipation    Still eating more fiber and eating fruit  . Shoulder Pain    Onset-2 months, right shoulder, achy pain intermittently and has tried Tylenol Arthritis with some relief.     HPI  HTN: patient states she has been compliant with her medication, no chest pain, no palpitation, no swelling, states constipation has improved  Chronic constipation: she states she has constipation - she has a bowel movement every 2 days  it has been since childhood and no longer has to strain. She states fiber supplements eating more fruit and no longer has to strain no abdominal pain.  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 No wheezing or SOB. Doing well at this time  Hyperlipidemia: she is not taking medication, last LDL had improved  Hyperglycemia: she denies polyphagia, polyuria or polydipsia.   Obesity: her weight is stable, she states she would like to lose weight and is trying to eat more fruit and vegetables, trying to exercise two days a week, explained that she needs to increase physical activity. She also bowls once a week with her husband   Patient Active Problem List   Diagnosis Date Noted  . Special screening for malignant neoplasms, colon   . CIN I (cervical intraepithelial neoplasia I) 02/22/2015  . Benign essential HTN 02/06/2015  . Dyslipidemia  02/06/2015  . Blood glucose elevated 02/06/2015  . Obesity (BMI 30-39.9) 02/06/2015  . Perennial allergic rhinitis with seasonal variation 02/06/2015  . Stress incontinence 02/06/2015  . Vitamin D deficiency 02/06/2015    Past Surgical History:  Procedure Laterality Date  . CESAREAN SECTION  2009, 2012  . COLONOSCOPY WITH PROPOFOL N/A 02/03/2016   Procedure: COLONOSCOPY WITH PROPOFOL;  Surgeon: Lucilla Lame, MD;  Location: [facility];  Service: Endoscopy;  Laterality: N/A;  . TUBAL LIGATION      Family History  Problem Relation Age of Onset  . Hypertension Mother   . Diabetes Mother   . Hypertension Father   . Breast cancer Maternal Aunt   . Ovarian cancer Neg Hx   . Colon cancer Neg Hx   . Heart disease Neg Hx     Social History   Social History  . Marital status: Married    Spouse name: N/A  . Number of children: N/A  . Years of education: N/A   Occupational History  . Not on file.   Social History Main Topics  . Smoking status: Never Smoker  . Smokeless tobacco: Never Used  . Alcohol use No  . Drug use: No  . Sexual activity: Yes    Partners: Male    Birth control/ protection: Surgical   Other Topics Concern  . Not on file   Social History Narrative  . No  narrative on file     Current Outpatient Prescriptions:  .  Calcium Carbonate-Vitamin D 600-400 MG-UNIT per chew tablet, Chew 1 tablet by mouth daily., Disp: , Rfl:  .  cetirizine (ZYRTEC) 10 MG tablet, Take 1 tablet (10 mg total) by mouth daily., Disp: 30 tablet, Rfl: 0 .  diltiazem (CARDIZEM CD) 180 MG 24 hr capsule, Take 1 capsule (180 mg total) by mouth daily., Disp: 90 capsule, Rfl: 1 .  FIBER PO, Take by mouth daily as needed., Disp: , Rfl:  .  fluticasone (FLONASE) 50 MCG/ACT nasal spray, INHALE 2 SPRAY BY INTRANASAL ROUTE EVERY DAY IN EACH NOSTRIL, Disp: , Rfl: 0 .  hydrochlorothiazide (MICROZIDE) 12.5 MG capsule, Take 1 capsule (12.5 mg total) by mouth every morning., Disp: 90 capsule,  Rfl: 1 .  montelukast (SINGULAIR) 10 MG tablet, Take 1 tablet (10 mg total) by mouth daily., Disp: 90 tablet, Rfl: 1 .  MULTIPLE VITAMIN PO, Take 1 tablet by mouth daily., Disp: , Rfl:   Allergies  Allergen Reactions  . Ace Inhibitors   . Sulfamethoxazole-Trimethoprim Rash     ROS  Constitutional: Negative for fever or weight change.  Respiratory: Negative for cough and shortness of breath.   Cardiovascular: Negative for chest pain or palpitations.  Gastrointestinal: Negative for abdominal pain, no bowel changes.  Musculoskeletal: Negative for gait problem or joint swelling.  Skin: Negative for rash.  Neurological: Negative for dizziness or headache.  No other specific complaints in a complete review of systems (except as listed in HPI above).  Objective  Vitals:   08/13/16 0839  BP: 118/74  Pulse: 84  Resp: 18  Temp: 97.5 F (36.4 C)  TempSrc: Oral  SpO2: 96%  Weight: 193 lb 3.2 oz (87.6 kg)  Height: 5' 1"  (1.549 m)    Body mass index is 36.5 kg/m.  Physical Exam  Constitutional: Patient appears well-developed and well-nourished. Obese  No distress.  HEENT: head atraumatic, normocephalic, pupils equal and reactive to light, neck supple, throat within normal limits Cardiovascular: Normal rate, regular rhythm and normal heart sounds.  No murmur heard. No BLE edema. Pulmonary/Chest: Effort normal and breath sounds normal. No respiratory distress. Abdominal: Soft.  There is no tenderness. Psychiatric: Patient has a normal mood and affect. behavior is normal. Judgment and thought content normal. Muscular Skeletal: normal rom, but causes mild discomfort with internal rotation and abduction of right shoulder  PHQ2/9: Depression screen [facility] 2/9 08/13/2016 02/08/2016 10/21/2015 08/11/2015 02/08/2015  Decreased Interest 0 0 0 0 0  Down, Depressed, Hopeless 0 0 0 0 0  PHQ - 2 Score 0 0 0 0 0     Fall Risk: Fall Risk  08/13/2016 02/08/2016 10/21/2015 08/11/2015 02/08/2015  Falls  in the past year? No No No No No     Functional Status Survey: Is the patient deaf or have difficulty hearing?: No Does the patient have difficulty seeing, even when wearing glasses/contacts?: No Does the patient have difficulty concentrating, remembering, or making decisions?: No Does the patient have difficulty walking or climbing stairs?: No Does the patient have difficulty dressing or bathing?: No Does the patient have difficulty doing errands alone such as visiting a doctor's office or shopping?: No    Assessment & Plan  1. Benign essential HTN  - hydrochlorothiazide (MICROZIDE) 12.5 MG capsule; Take 1 capsule (12.5 mg total) by mouth every morning.  Dispense: 90 capsule; Refill: 1 - diltiazem (CARDIZEM CD) 180 MG 24 hr capsule; Take 1 capsule (180 mg total) by  mouth daily.  Dispense: 90 capsule; Refill: 1  2. Needs flu shot  - Flu Vaccine QUAD 36+ mos PF IM (Fluarix & Fluzone Quad PF)  3. Dyslipidemia  On life style modification   4. Obesity (BMI 30-39.9)  Discussed with the patient the risk posed by an increased BMI. Discussed importance of portion control, calorie counting and at least 150 minutes of physical activity weekly. Avoid sweet beverages and drink more water. Eat at least 6 servings of fruit and vegetables daily   5. Vitamin D deficiency  Continue supplementation   6. Chronic constipation  Continue fiber intake  7. CIN I (cervical intraepithelial neoplasia I)  Seen by Dr. Marcelline Mates, last pap smear was normal   8. Perennial allergic rhinitis with seasonal variation  - montelukast (SINGULAIR) 10 MG tablet; Take 1 tablet (10 mg total) by mouth daily.  Dispense: 90 tablet; Refill: 1  9. Chronic right shoulder pain  Discussed home exercises, also discussed PT or chiropractor , she will call back for either return for steroid injection or referral to Ortho

## 2016-09-04 ENCOUNTER — Other Ambulatory Visit: Payer: Self-pay | Admitting: Family Medicine

## 2016-09-04 DIAGNOSIS — I10 Essential (primary) hypertension: Secondary | ICD-10-CM

## 2016-09-12 ENCOUNTER — Ambulatory Visit (INDEPENDENT_AMBULATORY_CARE_PROVIDER_SITE_OTHER): Payer: BC Managed Care – PPO | Admitting: Family Medicine

## 2016-09-12 ENCOUNTER — Encounter: Payer: Self-pay | Admitting: Family Medicine

## 2016-09-12 VITALS — BP 124/80 | HR 104 | Temp 97.8°F | Resp 16 | Ht 61.0 in | Wt 193.0 lb

## 2016-09-12 DIAGNOSIS — J029 Acute pharyngitis, unspecified: Secondary | ICD-10-CM

## 2016-09-12 DIAGNOSIS — Z9109 Other allergy status, other than to drugs and biological substances: Secondary | ICD-10-CM

## 2016-09-12 DIAGNOSIS — J040 Acute laryngitis: Secondary | ICD-10-CM | POA: Diagnosis not present

## 2016-09-12 LAB — POCT RAPID STREP A (OFFICE): RAPID STREP A SCREEN: NEGATIVE

## 2016-09-12 MED ORDER — PREDNISONE 20 MG PO TABS: 20.0000 mg | ORAL_TABLET | Freq: Every day | ORAL | 0 refills | Status: DC

## 2016-09-12 MED ORDER — FIRST-DUKES MOUTHWASH MT SUSP: 15.0000 mL | Freq: Three times a day (TID) | OROMUCOSAL | 0 refills | Status: DC

## 2016-09-12 NOTE — Progress Notes (Signed)
Name: Karen Donaldson   MRN: [id number]    DOB: 09/16/1965   Date:09/12/2016       Progress Note  Subjective  Chief Complaint  Chief Complaint  Patient presents with  . Sore Throat    for 3 weeks not getting better, headache and cough occasionally    HPI  Sore throat: she states symptoms started a few weeks ago, initially had a mild cough, followed by nasal congestion and sore throat. She has a long history of allergies and used to have allergy shots. She states sore throat is worse in am's, she has post-nasal drainage, clear rhinorrhea, feels tired, she has mild headache on temporal areas that is mild and intermittent. 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    Patient Active Problem List   Diagnosis Date Noted  . Special screening for malignant neoplasms, colon   . CIN I (cervical intraepithelial neoplasia I) 02/22/2015  . Benign essential HTN 02/06/2015  . Dyslipidemia 02/06/2015  . Blood glucose elevated 02/06/2015  . Obesity (BMI 30-39.9) 02/06/2015  . Perennial allergic rhinitis with seasonal variation 02/06/2015  . Stress incontinence 02/06/2015  . Vitamin D deficiency 02/06/2015    Past Surgical History:  Procedure Laterality Date  . CESAREAN SECTION  2009, 2012  . COLONOSCOPY WITH PROPOFOL N/A 02/03/2016   Procedure: COLONOSCOPY WITH PROPOFOL;  Surgeon: Lucilla Lame, MD;  Location: [facility];  Service: Endoscopy;  Laterality: N/A;  . TUBAL LIGATION      Family History  Problem Relation Age of Onset  . Hypertension Mother   . Diabetes Mother   . Hypertension Father   . Breast cancer Maternal Aunt   . Ovarian cancer Neg Hx   . Colon cancer Neg Hx   . Heart disease Neg Hx     Social History   Social History  . Marital status: Married    Spouse name: N/A  . Number of children: N/A  . Years of education: N/A   Occupational History  . Not on file.   Social History Main Topics  . Smoking status: Never  Smoker  . Smokeless tobacco: Never Used  . Alcohol use No  . Drug use: No  . Sexual activity: Yes    Partners: Male    Birth control/ protection: Surgical   Other Topics Concern  . Not on file   Social History Narrative  . No narrative on file     Current Outpatient Prescriptions:  .  Calcium Carbonate-Vitamin D 600-400 MG-UNIT per chew tablet, Chew 1 tablet by mouth daily., Disp: , Rfl:  .  cetirizine (ZYRTEC) 10 MG tablet, Take 1 tablet (10 mg total) by mouth daily., Disp: 30 tablet, Rfl: 0 .  diltiazem (CARDIZEM CD) 180 MG 24 hr capsule, Take 1 capsule (180 mg total) by mouth daily., Disp: 90 capsule, Rfl: 1 .  Diphenhyd-Hydrocort-Nystatin (FIRST-DUKES MOUTHWASH) SUSP, Use as directed 15 mLs in the mouth or throat 4 (four) times daily -  before meals and at bedtime. Add 30 ml of lidocaine, Disp: 237 mL, Rfl: 0 .  FIBER PO, Take by mouth daily as needed., Disp: , Rfl:  .  fluticasone (FLONASE) 50 MCG/ACT nasal spray, INHALE 2 SPRAY BY INTRANASAL ROUTE EVERY DAY IN EACH NOSTRIL, Disp: , Rfl: 0 .  hydrochlorothiazide (MICROZIDE) 12.5 MG capsule, Take 1 capsule (12.5 mg total) by mouth every morning., Disp: 90 capsule, Rfl: 1 .  montelukast (SINGULAIR) 10 MG tablet, Take 1 tablet (  10 mg total) by mouth daily., Disp: 90 tablet, Rfl: 1 .  MULTIPLE VITAMIN PO, Take 1 tablet by mouth daily., Disp: , Rfl:  .  predniSONE (DELTASONE) 20 MG tablet, Take 1 tablet (20 mg total) by mouth daily with breakfast., Disp: 6 tablet, Rfl: 0  Allergies  Allergen Reactions  . Ace Inhibitors   . Sulfamethoxazole-Trimethoprim Rash     ROS  Ten systems reviewed and is negative except as mentioned in HPI   Objective  Vitals:   09/12/16 1500  BP: 124/80  Pulse: (!) 104  Resp: 16  Temp: 97.8 F (36.6 C)  SpO2: 96%  Weight: 193 lb (87.5 kg)  Height: 5' 1"  (1.549 m)    Body mass index is 36.47 kg/m.  Physical Exam  Constitutional: Patient appears well-developed and well-nourished. Obese   No distress.  HEENT: head atraumatic, normocephalic, pupils equal and reactive to light, ears normal TM, neck supple, mild posterior pharynx erythematous, tonsils 2 plus Cardiovascular: Normal rate, regular rhythm and normal heart sounds.  No murmur heard. No BLE edema. Pulmonary/Chest: Effort normal and breath sounds normal. No respiratory distress. Abdominal: Soft.  There is no tenderness. Psychiatric: Patient has a normal mood and affect. behavior is normal. Judgment and thought content normal.  Recent Results (from the past 2160 hour(s))  POCT rapid strep A     Status: Normal   Collection Time: 09/12/16  3:08 PM  Result Value Ref Range   Rapid Strep A Screen Negative Negative     PHQ2/9: Depression screen [facility] 2/9 08/13/2016 02/08/2016 10/21/2015 08/11/2015 02/08/2015  Decreased Interest 0 0 0 0 0  Down, Depressed, Hopeless 0 0 0 0 0  PHQ - 2 Score 0 0 0 0 0     Fall Risk: Fall Risk  08/13/2016 02/08/2016 10/21/2015 08/11/2015 02/08/2015  Falls in the past year? No No No No No     Assessment & Plan  1. Sore throat  - POCT rapid strep A - Diphenhyd-Hydrocort-Nystatin (FIRST-DUKES MOUTHWASH) SUSP; Use as directed 15 mLs in the mouth or throat 4 (four) times daily -  before meals and at bedtime. Add 30 ml of lidocaine  Dispense: 237 mL; Refill: 0  2. Laryngitis, acute  - predniSONE (DELTASONE) 20 MG tablet; Take 1 tablet (20 mg total) by mouth daily with breakfast.  Dispense: 6 tablet; Refill: 0  3. Environmental allergies  Explained that if symptoms do not improve she will likely need to follow up with allergist and resume allergy shots

## 2017-02-11 ENCOUNTER — Ambulatory Visit (INDEPENDENT_AMBULATORY_CARE_PROVIDER_SITE_OTHER): Payer: BC Managed Care – PPO | Admitting: Family Medicine

## 2017-02-11 ENCOUNTER — Encounter: Payer: Self-pay | Admitting: Family Medicine

## 2017-02-11 VITALS — BP 124/82 | HR 77 | Temp 97.7°F | Resp 16 | Ht 61.0 in | Wt 201.0 lb

## 2017-02-11 DIAGNOSIS — N870 Mild cervical dysplasia: Secondary | ICD-10-CM | POA: Diagnosis not present

## 2017-02-11 DIAGNOSIS — E785 Hyperlipidemia, unspecified: Secondary | ICD-10-CM

## 2017-02-11 DIAGNOSIS — I10 Essential (primary) hypertension: Secondary | ICD-10-CM

## 2017-02-11 DIAGNOSIS — R739 Hyperglycemia, unspecified: Secondary | ICD-10-CM | POA: Diagnosis not present

## 2017-02-11 DIAGNOSIS — Z1231 Encounter for screening mammogram for malignant neoplasm of breast: Secondary | ICD-10-CM

## 2017-02-11 DIAGNOSIS — J3089 Other allergic rhinitis: Secondary | ICD-10-CM

## 2017-02-11 DIAGNOSIS — E669 Obesity, unspecified: Secondary | ICD-10-CM | POA: Diagnosis not present

## 2017-02-11 DIAGNOSIS — J302 Other seasonal allergic rhinitis: Secondary | ICD-10-CM | POA: Diagnosis not present

## 2017-02-11 LAB — COMPLETE METABOLIC PANEL WITH GFR
ALT: 12 U/L (ref 6–29)
AST: 12 U/L (ref 10–35)
Albumin: 4.1 g/dL (ref 3.6–5.1)
Alkaline Phosphatase: 70 U/L (ref 33–130)
BUN: 14 mg/dL (ref 7–25)
CO2: 25 mmol/L (ref 20–31)
Calcium: 9.2 mg/dL (ref 8.6–10.4)
Chloride: 103 mmol/L (ref 98–110)
Creat: 0.86 mg/dL (ref 0.50–1.05)
GFR, EST NON AFRICAN AMERICAN: 78 mL/min (ref >=60)
GFR, Est African American: >89 mL/min (ref >=60)
GLUCOSE: 96 mg/dL (ref 65–99)
POTASSIUM: 4 mmol/L (ref 3.5–5.3)
SODIUM: 135 mmol/L (ref 135–146)
TOTAL PROTEIN: 6.9 g/dL (ref 6.1–8.1)
Total Bilirubin: 0.6 mg/dL (ref 0.2–1.2)

## 2017-02-11 LAB — LIPID PANEL
Cholesterol: 203 mg/dL — ABNORMAL HIGH (ref <200)
HDL: 54 mg/dL (ref >50)
LDL CALC: 125 mg/dL — AB (ref <100)
Total CHOL/HDL Ratio: 3.8 Ratio (ref <5.0)
Triglycerides: 120 mg/dL (ref <150)
VLDL: 24 mg/dL (ref <30)

## 2017-02-11 MED ORDER — HYDROCHLOROTHIAZIDE 12.5 MG PO CAPS
12.5000 mg | ORAL_CAPSULE | Freq: Every morning | ORAL | 1 refills | Status: DC
Start: 2017-02-11 — End: 2017-02-13

## 2017-02-11 MED ORDER — MONTELUKAST SODIUM 10 MG PO TABS
10.0000 mg | ORAL_TABLET | Freq: Every day | ORAL | 1 refills | Status: DC
Start: 2017-02-11 — End: 2017-02-13

## 2017-02-11 MED ORDER — DILTIAZEM HCL ER COATED BEADS 180 MG PO CP24
180.0000 mg | ORAL_CAPSULE | Freq: Every day | ORAL | 1 refills | Status: DC
Start: 2017-02-11 — End: 2017-02-13

## 2017-02-11 NOTE — Patient Instructions (Addendum)
Please research GLP-1 Agonist medication called Saxenda.  Liraglutide injection (Weight Management) What is this medicine? LIRAGLUTIDE (LIR a GLOO tide) is used with a reduced calorie diet and exercise to help you lose weight. This medicine may be used for other purposes; ask your health care provider or pharmacist if you have questions. COMMON BRAND NAME(S): Saxenda What should I tell my health care provider before I take this medicine? They need to know if you have any of these conditions: -endocrine tumors (MEN 2) or if someone in your family had these tumors -gallbladder disease -high cholesterol -history of alcohol abuse problem -history of pancreatitis -kidney disease or if you are on dialysis -liver disease -previous swelling of the tongue, face, or lips with difficulty breathing, difficulty swallowing, hoarseness, or tightening of the throat -stomach problems -suicidal thoughts, plans, or attempt; a previous suicide attempt by you or a family member -thyroid cancer or if someone in your family had thyroid cancer -an unusual or allergic reaction to liraglutide, other medicines, foods, dyes, or preservatives -pregnant or trying to get pregnant -breast-feeding How should I use this medicine? This medicine is for injection under the skin of your upper leg, stomach area, or upper arm. You will be taught how to prepare and give this medicine. Use exactly as directed. Take your medicine at regular intervals. Do not take it more often than directed. It is important that you put your used needles and syringes in a special sharps container. Do not put them in a trash can. If you do not have a sharps container, call your pharmacist or healthcare provider to get one. A special MedGuide will be given to you by the pharmacist with each prescription and refill. Be sure to read this information carefully each time. Talk to your pediatrician regarding the use of this medicine in children. Special  care may be needed. Overdosage: If you think you have taken too much of this medicine contact a poison control center or emergency room at once. NOTE: This medicine is only for you. Do not share this medicine with others. What if I miss a dose? If you miss a dose, take it as soon as you can. If it is almost time for your next dose, take only that dose. Do not take double or extra doses. If you miss your dose for 3 days or more, call your doctor or health care professional to talk about how to restart this medicine. What may interact with this medicine? -insulin and other medicines for diabetes This list may not describe all possible interactions. Give your health care provider a list of all the medicines, herbs, non-prescription drugs, or dietary supplements you use. Also tell them if you smoke, drink alcohol, or use illegal drugs. Some items may interact with your medicine. What should I watch for while using this medicine? Visit your doctor or health care professional for regular checks on your progress. This medicine is intended to be used in addition to a healthy diet and appropriate exercise. The best results are achieved this way. Do not increase or in any way change your dose without consulting your doctor or health care professional. Drink plenty of fluids while taking this medicine. Check with your doctor or health care professional if you get an attack of severe diarrhea, nausea, and vomiting. The loss of too much body fluid can make it dangerous for you to take this medicine. This medicine may affect blood sugar levels. If you have diabetes, check with your doctor or  health care professional before you change your diet or the dose of your diabetic medicine. Patients and their families should watch out for worsening depression or thoughts of suicide. Also watch out for sudden changes in feelings such as feeling anxious, agitated, panicky, irritable, hostile, aggressive, impulsive, severely  restless, overly excited and hyperactive, or not being able to sleep. If this happens, especially at the beginning of treatment or after a change in dose, call your health care professional. What side effects may I notice from receiving this medicine? Side effects that you should report to your doctor or health care professional as soon as possible: -allergic reactions like skin rash, itching or hives, swelling of the face, lips, or tongue -breathing problems -diarrhea that continues or is severe -lump or swelling on the neck -severe nausea -signs and symptoms of infection like fever or chills; cough; sore throat; pain or trouble passing urine -signs and symptoms of low blood sugar such as feeling anxious, confusion, dizziness, increased hunger, unusually weak or tired, sweating, shakiness, cold, irritable, headache, blurred vision, fast heartbeat, loss of consciousness -signs and symptoms of kidney injury like trouble passing urine or change in the amount of urine -trouble swallowing -unusual stomach upset or pain -vomiting Side effects that usually do not require medical attention (report to your doctor or health care professional if they continue or are bothersome): -constipation -decreased appetite -diarrhea -fatigue -headache -nausea -pain, redness, or irritation at site where injected -stomach upset -stuffy or runny nose This list may not describe all possible side effects. Call your doctor for medical advice about side effects. You may report side effects to FDA at 1-800-FDA-1088. Where should I keep my medicine? Keep out of the reach of children. Store unopened pen in a refrigerator between 2 and 8 degrees C (36 and 46 degrees F). Do not freeze or use if the medicine has been frozen. Protect from light and excessive heat. After you first use the pen, it can be stored at room temperature between 15 and 30 degrees C (59 and 86 degrees F) or in a refrigerator. Throw away your used pen  after 30 days or after the expiration date, whichever comes first. Do not store your pen with the needle attached. If the needle is left on, medicine may leak from the pen. NOTE: This sheet is a summary. It may not cover all possible information. If you have questions about this medicine, talk to your doctor, pharmacist, or health care provider.  2018 [employer]vier/Gold Standard (2016-07-26 14:41:37)

## 2017-02-11 NOTE — Progress Notes (Signed)
Name: Karen Donaldson   MRN: [id number]    DOB: 10-17-1965   Date:02/11/2017       Progress Note  Subjective  Chief Complaint  Chief Complaint  Patient presents with  . Follow-up    6 month recheck  . Medication Refill    HPI  HTN: patient states she has been compliant with her medication, no chest pain, no palpitation, no swelling, no vision changes. BP at goal today.  Chronic constipation: BM's have been much more regular - she has a bowel movement every 2 days it has been since childhood and no longer has to strain. She states stopped fiber supplements but is drinking water, exercising, eating more fruit and no longer has to strain no abdominal pain.  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 No wheezing or SOB. Doing well at this time. Taking Flonase, Zyrtec, and Singulair daily.  Hyperlipidemia: she is not taking medication, last LDL had improved  Hyperglycemia: she denies polyphagia, polyuria or polydipsia.  Obesity: gained 8lbs since last visit in February. She started exercising about 3 weeks.  She has been walking on the treadmill and at the track 3 days a week (sometimes more).   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   Health Maintenance: Needs referral for mammogram  Stress Incontinence: Has had as ongoing issue - worse when seasons change and she has sinus congestion and sneezing. She says she does not want to do PT right now but will think about it in the future.  Patient Active Problem List   Diagnosis Date Noted  . Special screening for malignant neoplasms, colon   . CIN I (cervical intraepithelial neoplasia I) 02/22/2015  . Benign  essential HTN 02/06/2015  . Dyslipidemia 02/06/2015  . Blood glucose elevated 02/06/2015  . Obesity (BMI 30-39.9) 02/06/2015  . Perennial allergic rhinitis with seasonal variation 02/06/2015  . Stress incontinence 02/06/2015  . Vitamin D deficiency 02/06/2015    Past Surgical History:  Procedure Laterality Date  . CESAREAN SECTION  2009, 2012  . COLONOSCOPY WITH PROPOFOL N/A 02/03/2016   Procedure: COLONOSCOPY WITH PROPOFOL;  Surgeon: Lucilla Lame, MD;  Location: [facility];  Service: Endoscopy;  Laterality: N/A;  . TUBAL LIGATION      Family History  Problem Relation Age of Onset  . Hypertension Mother   . Diabetes Mother   . Hypertension Father   . Breast cancer Maternal Aunt   . Ovarian cancer Neg Hx   . Colon cancer Neg Hx   . Heart disease Neg Hx     Social History   Social History  . Marital status: Married    Spouse name: N/A  . Number of children: N/A  . Years of education: N/A   Occupational History  . Not on file.   Social History Main Topics  . Smoking status: Never Smoker  . Smokeless tobacco: Never Used  . Alcohol use No  . Drug use: No  . Sexual activity: Yes    Partners: Male    Birth control/ protection: Surgical   Other Topics Concern  . Not on file   Social History Narrative  .  No narrative on file     Current Outpatient Prescriptions:  .  Calcium Carbonate-Vitamin D 600-400 MG-UNIT per chew tablet, Chew 1 tablet by mouth daily., Disp: , Rfl:  .  cetirizine (ZYRTEC) 10 MG tablet, Take 1 tablet (10 mg total) by mouth daily., Disp: 30 tablet, Rfl: 0 .  diltiazem (CARDIZEM CD) 180 MG 24 hr capsule, Take 1 capsule (180 mg total) by mouth daily., Disp: 90 capsule, Rfl: 1 .  FIBER PO, Take by mouth daily as needed., Disp: , Rfl:  .  fluticasone (FLONASE) 50 MCG/ACT nasal spray, INHALE 2 SPRAY BY INTRANASAL ROUTE EVERY DAY IN EACH NOSTRIL, Disp: , Rfl: 0 .  hydrochlorothiazide (MICROZIDE) 12.5 MG capsule, Take 1 capsule (12.5 mg total)  by mouth every morning., Disp: 90 capsule, Rfl: 1 .  montelukast (SINGULAIR) 10 MG tablet, Take 1 tablet (10 mg total) by mouth daily., Disp: 90 tablet, Rfl: 1 .  MULTIPLE VITAMIN PO, Take 1 tablet by mouth daily., Disp: , Rfl:   Allergies  Allergen Reactions  . Ace Inhibitors   . Sulfamethoxazole-Trimethoprim Rash     ROS  Constitutional: Negative for fever or weight change.  Respiratory: Negative for cough and shortness of breath.   Cardiovascular: Negative for chest pain or palpitations.  Gastrointestinal: Negative for abdominal pain, no bowel changes.  Musculoskeletal: Negative for gait problem or joint swelling.  Skin: Negative for rash.  Neurological: Negative for dizziness or headache.  No other specific complaints in a complete review of systems (except as listed in HPI above).  Objective  Vitals:   02/11/17 0828  BP: 124/82  Pulse: 77  Resp: 16  Temp: 97.7 F (36.5 C)  TempSrc: Oral  SpO2: 96%  Weight: 201 lb (91.2 kg)  Height: 5' 1"  (1.549 m)   Body mass index is 37.98 kg/m.  Physical Exam Constitutional: Patient appears well-developed and well-nourished. Obese No distress.  HEENT: head atraumatic, normocephalic, pupils equal and reactive to light, ears, neck supple, throat within normal limits Cardiovascular: Normal rate, regular rhythm and normal heart sounds.  No murmur heard. No BLE edema. Pulmonary/Chest: Effort normal and breath sounds normal. No respiratory distress. Abdominal: Soft.  There is no tenderness. Psychiatric: Patient has a normal mood and affect. behavior is normal. Judgment and thought content normal.  No results found for this or any previous visit (from the past 2160 hour(s)).  PHQ2/9: Depression screen [facility] 2/9 08/13/2016 02/08/2016 10/21/2015 08/11/2015 02/08/2015  Decreased Interest 0 0 0 0 0  Down, Depressed, Hopeless 0 0 0 0 0  PHQ - 2 Score 0 0 0 0 0   Fall Risk: Fall Risk  08/13/2016 02/08/2016 10/21/2015 08/11/2015 02/08/2015   Falls in the past year? No No No No No   Assessment & Plan  1. Benign essential HTN - diltiazem (CARDIZEM CD) 180 MG 24 hr capsule; Take 1 capsule (180 mg total) by mouth daily.  Dispense: 90 capsule; Refill: 1 - hydrochlorothiazide (MICROZIDE) 12.5 MG capsule; Take 1 capsule (12.5 mg total) by mouth every morning.  Dispense: 90 capsule; Refill: 1 - COMPLETE METABOLIC PANEL WITH GFR  2. Perennial allergic rhinitis with seasonal variation - montelukast (SINGULAIR) 10 MG tablet; Take 1 tablet (10 mg total) by mouth daily.  Dispense: 90 tablet; Refill: 1  3. Screening mammogram, encounter for - MM DIGITAL SCREENING BILATERAL; Future  4. CIN I (cervical intraepithelial neoplasia I) Stable - Sees Dr. Marcelline Mates in August 2018 for Pap  5. Blood glucose elevated - COMPLETE METABOLIC PANEL  WITH GFR  6. Dyslipidemia - Lipid panel  7. Obesity (BMI 30-39.9) - Lipid panel - Discussed referral to nutrition - pt declines; discussed Saxenda, pt will think about it and call to s/c appt if interested.  -Reviewed Health Maintenance: Mammogram ordered.  I have saw patient and examined patient with Raelyn Ensign , NP-C  Steele Sizer, MD [facility] 02/11/2017, 10:21 AM

## 2017-02-13 ENCOUNTER — Telehealth: Payer: Self-pay

## 2017-02-13 ENCOUNTER — Other Ambulatory Visit: Payer: Self-pay

## 2017-02-13 DIAGNOSIS — I10 Essential (primary) hypertension: Secondary | ICD-10-CM

## 2017-02-13 DIAGNOSIS — J302 Other seasonal allergic rhinitis: Secondary | ICD-10-CM

## 2017-02-13 DIAGNOSIS — J3089 Other allergic rhinitis: Secondary | ICD-10-CM

## 2017-02-13 MED ORDER — MONTELUKAST SODIUM 10 MG PO TABS: 10.0000 mg | ORAL_TABLET | Freq: Every day | ORAL | 1 refills | Status: DC

## 2017-02-13 MED ORDER — HYDROCHLOROTHIAZIDE 12.5 MG PO CAPS: 12.5000 mg | ORAL_CAPSULE | Freq: Every morning | ORAL | 1 refills | Status: DC

## 2017-02-13 MED ORDER — DILTIAZEM HCL ER COATED BEADS 180 MG PO CP24: 180.0000 mg | ORAL_CAPSULE | Freq: Every day | ORAL | 1 refills | Status: DC

## 2017-02-13 NOTE — Telephone Encounter (Signed)
Patient states medication was sent into the wrong pharmacy. She no longer uses [facility] and asked we please sent them to [facility] in [place]. I rerouted the three medication Emily sent into [facility] to [facility] for patient and notify her of this.

## 2017-02-13 NOTE — Telephone Encounter (Signed)
Thank you :)

## 2017-02-26 ENCOUNTER — Encounter: Payer: Self-pay | Admitting: Obstetrics and Gynecology

## 2017-02-26 ENCOUNTER — Ambulatory Visit (INDEPENDENT_AMBULATORY_CARE_PROVIDER_SITE_OTHER): Payer: BC Managed Care – PPO | Admitting: Obstetrics and Gynecology

## 2017-02-26 VITALS — BP 137/92 | HR 72 | Ht 61.0 in | Wt 199.0 lb

## 2017-02-26 DIAGNOSIS — I10 Essential (primary) hypertension: Secondary | ICD-10-CM

## 2017-02-26 DIAGNOSIS — E785 Hyperlipidemia, unspecified: Secondary | ICD-10-CM

## 2017-02-26 DIAGNOSIS — R7303 Prediabetes: Secondary | ICD-10-CM

## 2017-02-26 DIAGNOSIS — Z01419 Encounter for gynecological examination (general) (routine) without abnormal findings: Secondary | ICD-10-CM | POA: Diagnosis not present

## 2017-02-26 DIAGNOSIS — N951 Menopausal and female climacteric states: Secondary | ICD-10-CM | POA: Diagnosis not present

## 2017-02-26 DIAGNOSIS — N870 Mild cervical dysplasia: Secondary | ICD-10-CM | POA: Diagnosis not present

## 2017-02-26 NOTE — Patient Instructions (Addendum)

## 2017-02-26 NOTE — Addendum Note (Signed)
Addended by: Donalee Citrin on: 02/26/2017 09:28 AM   Modules accepted: Orders

## 2017-02-26 NOTE — Progress Notes (Signed)
GYNECOLOGY ANNUAL PHYSICAL EXAM PROGRESS NOTE  Subjective:    Karen Donaldson is a 51 y.o. [id number] African-American female who presents for an annual exam. The patient has no complaints today. The patient is sexually active. The patient wears seatbelts: yes. The patient participates in regular exercise: yes ( 3 times per weke). Has the patient ever been transfused or tattooed?: no. The patient reports that there is not domestic violence in her life.    Gynecologic History Menarche age: 23 Patient's last menstrual period was 02/14/2017. Contraception: tubal ligation History of STI's: Denies  Last Pap: 01/29/2015. Results were: normal.  History of prior abnormal pap smear, ASCUS HR HPV+ in 2016.  S/p Colposcopy with CIN I.   Last mammogram: 02/22/2016. Results were: normal.  Scheduled for this upcoming Thursday.  Last Colonoscopy: 01/2016.  Results were normal.   Obstetric History   G2   P2   T2   P0   A0   L2    SAB0   TAB0   Ectopic0   Multiple0   Live Births2     # Outcome Date GA Lbr Len/2nd Weight Sex Delivery Anes PTL Lv  2 Term 2012   6 lb (2.722 kg) M CS-LTranv   LIV  1 Term 2009   5 lb 2.1 oz (2.327 kg) M CS-LTranv   LIV      Past Medical History:  Diagnosis Date  . Abnormal Pap smear of cervix 01/2015   ascus/pos  . Allergy   . Dyslipidemia   . High risk HPV infection   . Hyperglycemia   . Hypertension   . Hypertrophy, uterus   . Stress incontinence   . Vitamin D deficiency     Past Surgical History:  Procedure Laterality Date  . CESAREAN SECTION  2009, 2012  . COLONOSCOPY WITH PROPOFOL N/A 02/03/2016   Procedure: COLONOSCOPY WITH PROPOFOL;  Surgeon: Lucilla Lame, MD;  Location: [place];  Service: Endoscopy;  Laterality: N/A;  . TUBAL LIGATION      Family History  Problem Relation Age of Onset  . Hypertension Mother   . Diabetes Mother   . Hypertension Father   . Breast cancer Maternal Aunt   . Ovarian cancer Neg Hx   . Colon cancer Neg  Hx   . Heart disease Neg Hx     Social History   Social History  . Marital status: Married    Spouse name: N/A  . Number of children: N/A  . Years of education: N/A   Occupational History  . Not on file.   Social History Main Topics  . Smoking status: Never Smoker  . Smokeless tobacco: Never Used  . Alcohol use No  . Drug use: No  . Sexual activity: Yes    Partners: Male    Birth control/ protection: Surgical   Other Topics Concern  . Not on file   Social History Narrative  . No narrative on file    Current Outpatient Prescriptions on File Prior to Visit  Medication Sig Dispense Refill  . Calcium Carbonate-Vitamin D 600-400 MG-UNIT per chew tablet Chew 1 tablet by mouth daily.    . cetirizine (ZYRTEC) 10 MG tablet Take 1 tablet (10 mg total) by mouth daily. 30 tablet 0  . diltiazem (CARDIZEM CD) 180 MG 24 hr capsule Take 1 capsule (180 mg total) by mouth daily. 90 capsule 1  . FIBER PO Take by mouth daily as needed.    . fluticasone (FLONASE) 50  MCG/ACT nasal spray INHALE 2 SPRAY BY INTRANASAL ROUTE EVERY DAY IN EACH NOSTRIL  0  . hydrochlorothiazide (MICROZIDE) 12.5 MG capsule Take 1 capsule (12.5 mg total) by mouth every morning. 90 capsule 1  . montelukast (SINGULAIR) 10 MG tablet Take 1 tablet (10 mg total) by mouth daily. 90 tablet 1  . MULTIPLE VITAMIN PO Take 1 tablet by mouth daily.     No current facility-administered medications on file prior to visit.     Allergies  Allergen Reactions  . Ace Inhibitors   . Sulfamethoxazole-Trimethoprim Rash    Review of Systems Constitutional: negative for chills, fatigue, fevers and sweats Eyes: negative for irritation, redness and visual disturbance Ears, nose, mouth, throat, and face: negative for hearing loss, nasal congestion, snoring and tinnitus Respiratory: negative for asthma, cough, sputum Cardiovascular: negative for chest pain, dyspnea, exertional chest pressure/discomfort, irregular heart beat,  palpitations and syncope Gastrointestinal: negative for abdominal pain, change in bowel habits, nausea and vomiting Genitourinary: positive for irregular menstrual periods (notes occasionally skipping a month or 2).  Negative for genital lesions, sexual problems and vaginal discharge, dysuria and urinary incontinence Integument/breast: negative for breast lump, breast tenderness and nipple discharge Hematologic/lymphatic: negative for bleeding and easy bruising Musculoskeletal:negative for back pain and muscle weakness Neurological: negative for dizziness, headaches, vertigo and weakness Endocrine: negative for diabetic symptoms including polydipsia, polyuria and skin dryness Allergic/Immunologic: negative for hay fever and urticaria      Objective:  Blood pressure (!) 137/92, pulse 72, height 5' 1"  (1.549 m), weight 199 lb (90.3 kg), last menstrual period 02/14/2017. Body mass index is 37.6 kg/m.    General Appearance:    Alert, cooperative, no distress, appears stated age, moderately obese.   Head:    Normocephalic, without obvious abnormality, atraumatic  Eyes:    PERRL, conjunctiva/corneas clear, EOM's intact, both eyes  Ears:    Normal external ear canals, both ears  Nose:   Nares normal, septum midline, mucosa normal, no drainage or sinus tenderness  Throat:   Lips, mucosa, and tongue normal; teeth and gums normal  Neck:   Supple, symmetrical, trachea midline, no adenopathy; thyroid: no enlargement/tenderness/nodules; no carotid bruit or JVD  Back:     Symmetric, no curvature, ROM normal, no CVA tenderness  Lungs:     Clear to auscultation bilaterally, respirations unlabored  Chest Wall:    No tenderness or deformity   Heart:    Regular rate and rhythm, S1 and S2 normal, no murmur, rub or gallop  Breast Exam:    No tenderness, masses, or nipple abnormality  Abdomen:     Soft, non-tender, bowel sounds active all four quadrants, no masses, no organomegaly.    Genitalia:     Pelvic:external genitalia normal, vagina without lesions, discharge, or tenderness, rectovaginal septum  normal. Cervix normal in appearance, no cervical motion tenderness, no adnexal masses or tenderness.  Uterus normal size, shape, mobile, regular contours, nontender.  Rectal:    Normal external sphincter.  No hemorrhoids appreciated. Internal exam not done.   Extremities:   Extremities normal, atraumatic, no cyanosis or edema  Pulses:   2+ and symmetric all extremities  Skin:   Skin color, texture, turgor normal, no rashes or lesions  Lymph nodes:   Cervical, supraclavicular, and axillary nodes normal  Neurologic:   CNII-XII intact, normal strength, sensation and reflexes throughout   .  Labs:  No results found for: WBC, HGB, HCT, MCV, PLT  Lab Results  Component Value Date   CREATININE  0.86 02/11/2017   BUN 14 02/11/2017   NA 135 02/11/2017   K 4.0 02/11/2017   CL 103 02/11/2017   CO2 25 02/11/2017    Lab Results  Component Value Date   ALT 12 02/11/2017   AST 12 02/11/2017   ALKPHOS 70 02/11/2017   BILITOT 0.6 02/11/2017    Lab Results  Component Value Date   CHOL 203 (H) 02/11/2017   HDL 54 02/11/2017   LDLCALC 125 (H) 02/11/2017   TRIG 120 02/11/2017   CHOLHDL 3.8 02/11/2017    No results found for: TSH  Lab Results  Component Value Date   HGBA1C 5.8 (H) 02/08/2016    Results for MARIALICE, NEWKIRK (MRN [id number]) as of 02/26/2017 08:19  Ref. Range 02/08/2016 10:12  Vitamin D, 25-Hydroxy Latest Ref Range: 30 - 100 ng/mL 31    Assessment:   Routine gynecologic exam.   H/o CIN I Benign HTN Hyperlipidemia Perimenopausal status Pre-diabetes  Plan:    Has had annual labs by PCP.  Benign HTN, pre-diabetes, hyperlipidemia managed by PCP.  Breast self exam technique reviewed and patient encouraged to perform self-exam monthly. Contraception: tubal ligation. Discussed healthy lifestyle modifications. Mammogram due, scheduled for this week.   Colonoscopy up to date (01/2016) Pap smear performed today for h/o abnormal pap smear and CIN I on coloposcopy.   Perimenopausal status - reiterated diagnoses and expectations in upcoming years.  Follow up in 1 year.   Rubie Maid, MD [facility]

## 2017-02-28 ENCOUNTER — Ambulatory Visit
Admission: RE | Admit: 2017-02-28 | Discharge: 2017-02-28 | Disposition: A | Discharge status: Home / Self Care | Payer: BC Managed Care – PPO | Source: Ambulatory Visit | Attending: Family Medicine | Admitting: Family Medicine

## 2017-02-28 DIAGNOSIS — Z1231 Encounter for screening mammogram for malignant neoplasm of breast: Secondary | ICD-10-CM | POA: Diagnosis present

## 2017-02-28 LAB — PAP IG AND HPV HIGH-RISK
HPV, high-risk: POSITIVE — AB
PAP SMEAR COMMENT: 0

## 2017-03-01 ENCOUNTER — Telehealth: Payer: Self-pay

## 2017-03-01 NOTE — Telephone Encounter (Signed)
-----   Message from Rubie Maid, MD sent at 02/28/2017 11:08 AM EDT ----- Can we call [facility] to reflex the HPV for 16/18 types? Please inform patient that her pap smear was negative but she was HPV positive.   If those are positive then she will need a colposcopy. If negative, then she can f/u next year with a pap smear.

## 2017-03-01 NOTE — Telephone Encounter (Signed)
Called [facility], had additional testing added.

## 2017-03-04 LAB — HPV GENOTYPES 16/18,45
HPV GENOTYPE 16: POSITIVE — AB
HPV GENOTYPE 18,45: NEGATIVE

## 2017-03-04 LAB — SPECIMEN STATUS REPORT

## 2017-03-21 ENCOUNTER — Ambulatory Visit (INDEPENDENT_AMBULATORY_CARE_PROVIDER_SITE_OTHER): Payer: BC Managed Care – PPO | Admitting: Obstetrics and Gynecology

## 2017-03-21 ENCOUNTER — Encounter: Payer: Self-pay | Admitting: Obstetrics and Gynecology

## 2017-03-21 VITALS — BP 161/97 | HR 80 | Ht 61.0 in | Wt 199.1 lb

## 2017-03-21 DIAGNOSIS — R87618 Other abnormal cytological findings on specimens from cervix uteri: Secondary | ICD-10-CM

## 2017-03-21 DIAGNOSIS — R8789 Other abnormal findings in specimens from female genital organs: Secondary | ICD-10-CM | POA: Diagnosis not present

## 2017-03-21 NOTE — Progress Notes (Signed)
    GYNECOLOGY CLINIC COLPOSCOPY PROCEDURE NOTE  51 y.o. [id number] here for colposcopy for NILM but HPV HR + (type 16). Pap smear was on 02/26/2017.  Discussed role for HPV in cervical dysplasia, need for surveillance.  Patient given informed consent.  Placed in lithotomy position. Cervix viewed with speculum and colposcope after application of acetic acid.   Colposcopy adequate? No.  Unable to entirely visualize squamocolumnar junction due to cervical stenosis  no visible lesions, no mosaicism, no punctation and no abnormal vasculature; no biopsies obtained.  ECC specimen obtained. All specimens were labeled and sent to pathology.   Patient was given post procedure instructions.  Will follow up pathology and manage accordingly; patient will be contacted with results and recommendations.  Routine preventative health maintenance measures emphasized.    Rubie Maid, MD [facility]

## 2017-03-27 ENCOUNTER — Telehealth: Payer: Self-pay

## 2017-03-27 LAB — PATHOLOGY

## 2017-03-27 NOTE — Telephone Encounter (Signed)
Called pt no answer. LM for pt informing her of negative results.

## 2017-03-27 NOTE — Telephone Encounter (Signed)
-----   Message from Rubie Maid, MD sent at 03/27/2017  1:02 PM EDT ----- Please inform patient of normal ECC results. To continue routine screening with pap smear in 1 year

## 2017-08-14 ENCOUNTER — Encounter: Payer: Self-pay | Admitting: Family Medicine

## 2017-08-14 ENCOUNTER — Ambulatory Visit: Payer: BC Managed Care – PPO | Admitting: Family Medicine

## 2017-08-14 VITALS — BP 130/82 | HR 68 | Resp 16 | Ht 61.0 in | Wt 197.4 lb

## 2017-08-14 DIAGNOSIS — J3089 Other allergic rhinitis: Secondary | ICD-10-CM

## 2017-08-14 DIAGNOSIS — R739 Hyperglycemia, unspecified: Secondary | ICD-10-CM

## 2017-08-14 DIAGNOSIS — J302 Other seasonal allergic rhinitis: Secondary | ICD-10-CM | POA: Diagnosis not present

## 2017-08-14 DIAGNOSIS — E669 Obesity, unspecified: Secondary | ICD-10-CM | POA: Diagnosis not present

## 2017-08-14 DIAGNOSIS — E559 Vitamin D deficiency, unspecified: Secondary | ICD-10-CM | POA: Diagnosis not present

## 2017-08-14 DIAGNOSIS — I10 Essential (primary) hypertension: Secondary | ICD-10-CM | POA: Diagnosis not present

## 2017-08-14 DIAGNOSIS — E785 Hyperlipidemia, unspecified: Secondary | ICD-10-CM

## 2017-08-14 DIAGNOSIS — Z23 Encounter for immunization: Secondary | ICD-10-CM

## 2017-08-14 DIAGNOSIS — K5909 Other constipation: Secondary | ICD-10-CM | POA: Diagnosis not present

## 2017-08-14 MED ORDER — DILTIAZEM HCL ER COATED BEADS 180 MG PO CP24
180.0000 mg | ORAL_CAPSULE | Freq: Every day | ORAL | 1 refills | Status: DC
Start: 2017-08-14 — End: 2018-02-12

## 2017-08-14 MED ORDER — MONTELUKAST SODIUM 10 MG PO TABS: 10.0000 mg | ORAL_TABLET | Freq: Every day | ORAL | 1 refills | Status: DC

## 2017-08-14 MED ORDER — HYDROCHLOROTHIAZIDE 12.5 MG PO CAPS: 12.5000 mg | ORAL_CAPSULE | Freq: Every morning | ORAL | 1 refills | Status: DC

## 2017-08-14 NOTE — Progress Notes (Signed)
Name: Karen Donaldson   MRN: [id number]    DOB: [birth year]   Date:08/14/2017       Progress Note  Subjective  Chief Complaint  Chief Complaint  Patient presents with  . Hypertension    HPI  HTN: patient states she has been compliant with her medication, no chest pain, no palpitation, no swelling.  BP at goal today, it was elevated   Chronic constipation: BM's have been much more regular - she has a bowel movement every 2 days Symptoms began  has been since childhood and no longer has to strain. She is back on fiber supplements but is drinking water, exercising, eating more fruit and no longer has to strain no abdominal pain. 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   Hyperlipidemia: she is not taking medication, last LDL had improved, she is on life style modification  Hyperglycemia: she denies polyphagia, polyuria or polydipsia.  Obesity: she lost 2 lbs since last visit. She is in a bowling league twice a week with her husband, she is also walking on her treadmill two days a week for at least 30 minutes.     Patient Active Problem List   Diagnosis Date Noted  . Special screening for malignant neoplasms, colon   . CIN I (cervical intraepithelial neoplasia I) 02/22/2015  . Benign essential HTN 02/06/2015  . Dyslipidemia 02/06/2015  . Blood glucose elevated 02/06/2015  . Obesity (BMI 30-39.9) 02/06/2015  . Perennial allergic rhinitis with seasonal variation 02/06/2015  . Stress incontinence 02/06/2015  . Vitamin D deficiency 02/06/2015    Past Surgical History:  Procedure Laterality Date  . CESAREAN SECTION  2009, 2012  . COLONOSCOPY  WITH PROPOFOL N/A 02/03/2016   Procedure: COLONOSCOPY WITH PROPOFOL;  Surgeon: Lucilla Lame, MD;  Location: [place];  Service: Endoscopy;  Laterality: N/A;  . TUBAL LIGATION      Family History  Problem Relation Age of Onset  . Hypertension Mother   . Diabetes Mother   . Hypertension Father   . Breast cancer Maternal Aunt   . Ovarian cancer Neg Hx   . Colon cancer Neg Hx   . Heart disease Neg Hx     Social History   Socioeconomic History  . Marital status: Married    Spouse name: Not on file  . Number of children: Not on file  . Years of education: Not on file  . Highest education level: Not on file  Social Needs  . Financial resource strain: Not on file  . Food insecurity - worry: Not on file  . Food insecurity - inability: Not on file  . Transportation needs - medical: Not on file  . Transportation needs - non-medical: Not on file  Occupational History  . Not on file  Tobacco Use  . Smoking status: Never Smoker  . Smokeless tobacco: Never Used  Substance and Sexual Activity  . Alcohol use: No    Alcohol/week: 0.0 oz  . Drug use: No  . Sexual activity: Yes    Partners: Male    Birth control/protection: Surgical  Other Topics Concern  . Not on file  Social History Narrative  . Not on  file     Current Outpatient Medications:  .  Calcium Carbonate-Vitamin D 600-400 MG-UNIT per chew tablet, Chew 1 tablet by mouth daily., Disp: , Rfl:  .  cetirizine (ZYRTEC) 10 MG tablet, Take 1 tablet (10 mg total) by mouth daily., Disp: 30 tablet, Rfl: 0 .  diltiazem (CARDIZEM CD) 180 MG 24 hr capsule, Take 1 capsule (180 mg total) by mouth daily., Disp: 90 capsule, Rfl: 1 .  FIBER PO, Take by mouth daily as needed., Disp: , Rfl:  .  fluticasone (FLONASE) 50 MCG/ACT nasal spray, INHALE 2 SPRAY BY INTRANASAL ROUTE EVERY DAY IN EACH NOSTRIL, Disp: , Rfl: 0 .  hydrochlorothiazide (MICROZIDE) 12.5 MG capsule, Take 1 capsule (12.5 mg total) by mouth every morning., Disp: 90  capsule, Rfl: 1 .  montelukast (SINGULAIR) 10 MG tablet, Take 1 tablet (10 mg total) by mouth daily., Disp: 90 tablet, Rfl: 1 .  MULTIPLE VITAMIN PO, Take 1 tablet by mouth daily., Disp: , Rfl:   Allergies  Allergen Reactions  . Ace Inhibitors   . Sulfamethoxazole-Trimethoprim Rash     ROS  Constitutional: Negative for fever or weight change.  Respiratory: Negative for cough and shortness of breath.   Cardiovascular: Negative for chest pain or palpitations.  Gastrointestinal: Negative for abdominal pain, no bowel changes.  Musculoskeletal: Negative for gait problem or joint swelling.  Skin: Negative for rash.  Neurological: Negative for dizziness or headache.  No other specific complaints in a complete review of systems (except as listed in HPI above).  Objective  Vitals:   08/14/17 1420  BP: 130/82  Pulse: 68  Resp: 16  SpO2: 97%  Weight: 197 lb 6.4 oz (89.5 kg)  Height: 5' 1"  (1.549 m)    Body mass index is 37.3 kg/m.  Physical Exam  Constitutional: Patient appears well-developed and well-nourished. Obese  No distress.  HEENT: head atraumatic, normocephalic, pupils equal and reactive to light, neck supple, throat within normal limits Cardiovascular: Normal rate, regular rhythm and normal heart sounds.  No murmur heard. No BLE edema. Pulmonary/Chest: Effort normal and breath sounds normal. No respiratory distress. Abdominal: Soft.  There is no tenderness. Psychiatric: Patient has a normal mood and affect. behavior is normal. Judgment and thought content normal.  PHQ2/9: Depression screen [facility] 2/9 08/14/2017 08/13/2016 02/08/2016 10/21/2015 08/11/2015  Decreased Interest 0 0 0 0 0  Down, Depressed, Hopeless 0 0 0 0 0  PHQ - 2 Score 0 0 0 0 0     Fall Risk: Fall Risk  08/14/2017 08/13/2016 02/08/2016 10/21/2015 08/11/2015  Falls in the past year? No No No No No     Functional Status Survey: Is the patient deaf or have difficulty hearing?: No Does the patient have  difficulty seeing, even when wearing glasses/contacts?: No Does the patient have difficulty concentrating, remembering, or making decisions?: No Does the patient have difficulty walking or climbing stairs?: No Does the patient have difficulty dressing or bathing?: No Does the patient have difficulty doing errands alone such as visiting a doctor's office or shopping?: No    Assessment & Plan  1. Benign essential HTN  - diltiazem (CARDIZEM CD) 180 MG 24 hr capsule; Take 1 capsule (180 mg total) by mouth daily.  Dispense: 90 capsule; Refill: 1 - hydrochlorothiazide (MICROZIDE) 12.5 MG capsule; Take 1 capsule (12.5 mg total) by mouth every morning.  Dispense: 90 capsule; Refill: 1  2. Flu vaccine need  - Flu Vaccine QUAD 36+ mos IM  3. Dyslipidemia  Continue life  style modification   4. Obesity (BMI 30-39.9)  Discussed with the patient the risk posed by an increased BMI. Discussed importance of portion control, calorie counting and at least 150 minutes of physical activity weekly. Avoid sweet beverages and drink more water. Eat at least 6 servings of fruit and vegetables daily   5. Perennial allergic rhinitis with seasonal variation  - montelukast (SINGULAIR) 10 MG tablet; Take 1 tablet (10 mg total) by mouth daily.  Dispense: 90 tablet; Refill: 1  6. Chronic constipation  Continue life style modification   7. Vitamin D deficiency   8. Blood glucose elevated  Recheck labs next visit.

## 2018-02-12 ENCOUNTER — Ambulatory Visit: Payer: BC Managed Care – PPO | Admitting: Family Medicine

## 2018-02-12 ENCOUNTER — Encounter: Payer: Self-pay | Admitting: Family Medicine

## 2018-02-12 VITALS — BP 116/78 | HR 70 | Temp 97.5°F | Resp 16 | Ht 61.0 in | Wt 205.8 lb

## 2018-02-12 DIAGNOSIS — J3089 Other allergic rhinitis: Secondary | ICD-10-CM | POA: Diagnosis not present

## 2018-02-12 DIAGNOSIS — K5909 Other constipation: Secondary | ICD-10-CM | POA: Diagnosis not present

## 2018-02-12 DIAGNOSIS — H9202 Otalgia, left ear: Secondary | ICD-10-CM

## 2018-02-12 DIAGNOSIS — I10 Essential (primary) hypertension: Secondary | ICD-10-CM | POA: Diagnosis not present

## 2018-02-12 DIAGNOSIS — E785 Hyperlipidemia, unspecified: Secondary | ICD-10-CM | POA: Diagnosis not present

## 2018-02-12 DIAGNOSIS — E669 Obesity, unspecified: Secondary | ICD-10-CM

## 2018-02-12 DIAGNOSIS — M26622 Arthralgia of left temporomandibular joint: Secondary | ICD-10-CM

## 2018-02-12 DIAGNOSIS — N870 Mild cervical dysplasia: Secondary | ICD-10-CM

## 2018-02-12 DIAGNOSIS — Z1231 Encounter for screening mammogram for malignant neoplasm of breast: Secondary | ICD-10-CM

## 2018-02-12 DIAGNOSIS — E559 Vitamin D deficiency, unspecified: Secondary | ICD-10-CM

## 2018-02-12 DIAGNOSIS — J302 Other seasonal allergic rhinitis: Secondary | ICD-10-CM

## 2018-02-12 DIAGNOSIS — Z1239 Encounter for other screening for malignant neoplasm of breast: Secondary | ICD-10-CM

## 2018-02-12 MED ORDER — MONTELUKAST SODIUM 10 MG PO TABS: 10.0000 mg | ORAL_TABLET | Freq: Every day | ORAL | 1 refills | Status: DC

## 2018-02-12 MED ORDER — HYDROCHLOROTHIAZIDE 12.5 MG PO CAPS: 12.5000 mg | ORAL_CAPSULE | Freq: Every morning | ORAL | 1 refills | Status: DC

## 2018-02-12 MED ORDER — DILTIAZEM HCL ER COATED BEADS 180 MG PO CP24: 180.0000 mg | ORAL_CAPSULE | Freq: Every day | ORAL | 1 refills | Status: DC

## 2018-02-12 MED ORDER — MELOXICAM 15 MG PO TABS: 15.0000 mg | ORAL_TABLET | Freq: Every day | ORAL | 0 refills | Status: DC

## 2018-02-12 NOTE — Progress Notes (Signed)
Name: Karen Donaldson   MRN: [id number]    DOB: [birth year]   Date:02/12/2018       Progress Note  Subjective  Chief Complaint  Chief Complaint  Patient presents with  . Follow-up    6 month f/u  . Hypertension    patient takes all meds as directed and has not had any neg sx  . Allergic Rhinitis     no major issues but has had some flares  . Hyperlipidemia    no neg sx  . Hyperglycemia  . Obesity    patient has done some lifestyle changes and has increased her physical activity  . Medication Refill    all meds - 90 day supply  . Labs Only    patient is fasting    HPI   HTN: patient states she has been compliant with her medication, no chest pain, no palpitation, no edema.  BP at goal today. No side effects of medication  Chronic constipation: BM's have been  regular - she has a bowel movement every 2 days Symptoms began  has been since childhood and no longer has to strain. She is still on fiber supplements but is drinking water, exercising,eating more fruit and no longer has to strain no abdominal pain. She has hemorrhoids, but no recent bleeding, colonoscopy is up to date.  History CIN I with HPV: seesDr. Leeanne Rio colposcopyin 03/2015, repeat pap smear was normal 02/2017, but positive HPV, she had endocervical curettage and was negative, she is due for follow up with Dr. Marcelline Mates this August and was reminded of importance of keeping appointment    AR: she has intermittent symptoms, she has episodes of nasal congestion, and a dry cough, usually triggered by weather changes. Stopping using flonase but still on singulair, she also takes Zyrtec daily   Hyperlipidemia: she is not taking medication, last LDL had improved, she is on life style modification. We will recheck labs today   Hyperglycemia: she denies polyphagia, polyuria or polydipsia.  Obesity:she lost 2 lbs before last visit, but weight is up again. 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  She is in a  bowling league twice a week with her husband, she is also walking on her treadmill or outdoors three to four times a week during the Summer.    Patient Active Problem List   Diagnosis Date Noted  . CIN I (cervical intraepithelial neoplasia I) 02/22/2015  . Benign essential HTN 02/06/2015  . Dyslipidemia 02/06/2015  . Blood glucose elevated 02/06/2015  . Obesity (BMI 30-39.9) 02/06/2015  . Perennial allergic rhinitis with seasonal variation 02/06/2015  . Stress incontinence 02/06/2015  . Vitamin D deficiency 02/06/2015    Past Surgical History:  Procedure Laterality Date  . CESAREAN SECTION  2009, 2012  . COLONOSCOPY WITH PROPOFOL N/A 02/03/2016   Procedure: COLONOSCOPY WITH PROPOFOL;  Surgeon: Lucilla Lame, MD;  Location: [place];  Service: Endoscopy;  Laterality: N/A;  . TUBAL LIGATION      Family History  Problem Relation Age of Onset  . Hypertension Mother   . Diabetes Mother   . Hypertension Father   . Breast cancer Maternal Aunt   . Ovarian cancer Neg Hx   . Colon cancer Neg Hx   . Heart disease Neg Hx     Social History   Socioeconomic History  . Marital status: Married    Spouse name: Elta Guadeloupe  . Number of children: 2  . Years of education: Not on file  .  Highest education level: Master's degree (e.g., MA, MS, MEng, MEd, MSW, MBA)  Occupational History  . Not on file  Social Needs  . Financial resource strain: Not hard at all  . Food insecurity:    Worry: Never true    Inability: Never true  . Transportation needs:    Medical: No    Non-medical: No  Tobacco Use  . Smoking status: Never Smoker  . Smokeless tobacco: Never Used  Substance and Sexual Activity  . Alcohol use: No    Alcohol/week: 0.0 oz  . Drug use: No  . Sexual activity: Yes    Partners: Male    Birth control/protection: Surgical  Lifestyle  . Physical activity:    Days per week: 4 days    Minutes per session: 30 min  . Stress: Not at all  Relationships  . Social  connections:    Talks on phone: More than three times a week    Gets together: More than three times a week    Attends religious service: 1 to 4 times per year    Active member of club or organization: Yes    Attends meetings of clubs or organizations: 1 to 4 times per year    Relationship status: Married  . Intimate partner violence:    Fear of current or ex partner: No    Emotionally abused: No    Physically abused: No    Forced sexual activity: No  Other Topics Concern  . Not on file  Social History Narrative  . Not on file     Current Outpatient Medications:  .  Calcium Carbonate-Vitamin D 600-400 MG-UNIT per chew tablet, Chew 1 tablet by mouth daily., Disp: , Rfl:  .  cetirizine (ZYRTEC) 10 MG tablet, Take 1 tablet (10 mg total) by mouth daily., Disp: 30 tablet, Rfl: 0 .  diltiazem (CARDIZEM CD) 180 MG 24 hr capsule, Take 1 capsule (180 mg total) by mouth daily., Disp: 90 capsule, Rfl: 1 .  FIBER PO, Take by mouth daily as needed., Disp: , Rfl:  .  hydrochlorothiazide (MICROZIDE) 12.5 MG capsule, Take 1 capsule (12.5 mg total) by mouth every morning., Disp: 90 capsule, Rfl: 1 .  montelukast (SINGULAIR) 10 MG tablet, Take 1 tablet (10 mg total) by mouth daily., Disp: 90 tablet, Rfl: 1 .  MULTIPLE VITAMIN PO, Take 1 tablet by mouth daily., Disp: , Rfl:   Allergies  Allergen Reactions  . Ace Inhibitors   . Sulfamethoxazole-Trimethoprim Rash     ROS  Constitutional: Negative for fever, positive for weight change.  Respiratory: Negative for cough and shortness of breath.   Cardiovascular: Negative for chest pain or palpitations.  Gastrointestinal: Negative for abdominal pain, no bowel changes.  Musculoskeletal: Negative for gait problem or joint swelling.  Skin: Negative for rash.  Neurological: Negative for dizziness or headache.  No other specific complaints in a complete review of systems (except as listed in HPI above).  Objective  Vitals:   02/12/18 0813  BP:  116/78  Pulse: 70  Resp: 16  Temp: (!) 97.5 F (36.4 C)  TempSrc: Oral  SpO2: 99%  Weight: 205 lb 12.8 oz (93.4 kg)  Height: 5' 1"  (1.549 m)    Body mass index is 38.89 kg/m.  Physical Exam  Constitutional: Patient appears well-developed and well-nourished. Obese No distress.  HEENT: head atraumatic, normocephalic, pupils equal and reactive to light, ears normal bilaterally, neck supple, throat within normal limits, negative for lymphadenopathy . TMJ dislocates on the  right , tender during palpation of TMJ on left  Cardiovascular: Normal rate, regular rhythm and normal heart sounds.  No murmur heard. No BLE edema. Pulmonary/Chest: Effort normal and breath sounds normal. No respiratory distress. Abdominal: Soft.  There is no tenderness. Psychiatric: Patient has a normal mood and affect. behavior is normal. Judgment and thought content normal.  PHQ2/9: Depression screen [facility] 2/9 02/12/2018 08/14/2017 08/13/2016 02/08/2016 10/21/2015  Decreased Interest 0 0 0 0 0  Down, Depressed, Hopeless 0 0 0 0 0  PHQ - 2 Score 0 0 0 0 0  Altered sleeping 0 - - - -  Tired, decreased energy 0 - - - -  Change in appetite 0 - - - -  Feeling bad or failure about yourself  0 - - - -  Trouble concentrating 0 - - - -  Suicidal thoughts 0 - - - -  PHQ-9 Score 0 - - - -     Fall Risk: Fall Risk  02/12/2018 08/14/2017 08/13/2016 02/08/2016 10/21/2015  Falls in the past year? No No No No No     Functional Status Survey: Is the patient deaf or have difficulty hearing?: No Does the patient have difficulty seeing, even when wearing glasses/contacts?: No Does the patient have difficulty concentrating, remembering, or making decisions?: No Does the patient have difficulty walking or climbing stairs?: No Does the patient have difficulty dressing or bathing?: No Does the patient have difficulty doing errands alone such as visiting a doctor's office or shopping?: No    Assessment & Plan  1. Benign essential  HTN  - diltiazem (CARDIZEM CD) 180 MG 24 hr capsule; Take 1 capsule (180 mg total) by mouth daily.  Dispense: 90 capsule; Refill: 1 - hydrochlorothiazide (MICROZIDE) 12.5 MG capsule; Take 1 capsule (12.5 mg total) by mouth every morning.  Dispense: 90 capsule; Refill: 1 - CBC with Differential/Platelet - COMPLETE METABOLIC PANEL WITH GFR  2. Perennial allergic rhinitis with seasonal variation  - montelukast (SINGULAIR) 10 MG tablet; Take 1 tablet (10 mg total) by mouth daily.  Dispense: 90 tablet; Refill: 1  3. Dyslipidemia  - Lipid panel   4. Chronic constipation   5. Vitamin D deficiency  - VITAMIN D 25 Hydroxy (Vit-D Deficiency, Fractures)  6. Obesity (BMI 30-39.9)  Discussed with the patient the risk posed by an increased BMI. Discussed importance of portion control, calorie counting and at least 150 minutes of physical activity weekly. Avoid sweet beverages and drink more water. Eat at least 6 servings of fruit and vegetables daily  - Hemoglobin A1c  7. CIN I (cervical intraepithelial neoplasia I)  Keep follow up with Dr. Marcelline Mates   8. Acute ear pain, left  It may be from TMJ we will try nsaid's she has follow up with dentist next week  9. Breast cancer screening  - MM DIGITAL SCREENING BILATERAL; Future  10. Arthralgia of left temporomandibular joint  - meloxicam (MOBIC) 15 MG tablet; Take 1 tablet (15 mg total) by mouth daily.  Dispense: 30 tablet; Refill: 0

## 2018-02-12 NOTE — Patient Instructions (Signed)
Temporomandibular Joint Syndrome Temporomandibular joint (TMJ) syndrome is a condition that affects the joints between your jaw and your skull. The TMJs are located near your ears and allow your jaw to open and close. These joints and the nearby muscles are involved in all movements of the jaw. People with TMJ syndrome have pain in the area of these joints and muscles. Chewing, biting, or other movements of the jaw can be difficult or painful. TMJ syndrome can be caused by various things. In many cases, the condition is mild and goes away within a few weeks. For some people, the condition can become a long-term problem. What are the causes? Possible causes of TMJ syndrome include:  Grinding your teeth or clenching your jaw. Some people do this when they are under stress.  Arthritis.  Injury to the jaw.  Head or neck injury.  Teeth or dentures that are not aligned well.  In some cases, the cause of TMJ syndrome may not be known. What are the signs or symptoms? The most common symptom is an aching pain on the side of the head in the area of the TMJ. Other symptoms may include:  Pain when moving your jaw, such as when chewing or biting.  Being unable to open your jaw all the way.  Making a clicking sound when you open your mouth.  Headache.  Earache.  Neck or shoulder pain.  How is this diagnosed? Diagnosis can usually be made based on your symptoms, your medical history, and a physical exam. Your health care provider may check the range of motion of your jaw. Imaging tests, such as X-rays or an MRI, are sometimes done. You may need to see your dentist to determine if your teeth and jaw are lined up correctly. How is this treated? TMJ syndrome often goes away on its own. If treatment is needed, the options may include:  Eating soft foods and applying ice or heat.  Medicines to relieve pain or inflammation.  Medicines to relax the muscles.  A splint, bite plate, or mouthpiece  to prevent teeth grinding or jaw clenching.  Relaxation techniques or counseling to help reduce stress.  Transcutaneous electrical nerve stimulation (TENS). This helps to relieve pain by applying an electrical current through the skin.  Acupuncture. This is sometimes helpful to relieve pain.  Jaw surgery. This is rarely needed.  Follow these instructions at home:  Take medicines only as directed by your health care provider.  Eat a soft diet if you are having trouble chewing.  Apply ice to the painful area. ? Put ice in a plastic bag. ? Place a towel between your skin and the bag. ? Leave the ice on for 20 minutes, 2-3 times a day.  Apply a warm compress to the painful area as directed.  Massage your jaw area and perform any jaw stretching exercises as recommended by your health care provider.  If you were given a mouthpiece or bite plate, wear it as directed.  Avoid foods that require a lot of chewing. Do not chew gum.  Keep all follow-up visits as directed by your health care provider. This is important. Contact a health care provider if:  You are having trouble eating.  You have new or worsening symptoms. Get help right away if:  Your jaw locks open or closed. This information is not intended to replace advice given to you by your health care provider. Make sure you discuss any questions you have with your health care provider. Document  Released: 04/03/2001 Document Revised: 03/08/2016 Document Reviewed: 02/11/2014 Elsevier Interactive Patient Education  [employer].

## 2018-02-13 LAB — COMPLETE METABOLIC PANEL WITH GFR
AG RATIO: 1.7 (calc) (ref 1.0–2.5)
ALT: 16 U/L (ref 6–29)
AST: 15 U/L (ref 10–35)
Albumin: 4.3 g/dL (ref 3.6–5.1)
Alkaline phosphatase (APISO): 79 U/L (ref 33–130)
BILIRUBIN TOTAL: 0.3 mg/dL (ref 0.2–1.2)
BUN: 13 mg/dL (ref 7–25)
CHLORIDE: 103 mmol/L (ref 98–110)
CO2: 25 mmol/L (ref 20–32)
Calcium: 9.5 mg/dL (ref 8.6–10.4)
Creat: 0.82 mg/dL (ref 0.50–1.05)
GFR, Est African American: 95 mL/min/{1.73_m2} (ref >=60)
GFR, Est Non African American: 82 mL/min/{1.73_m2} (ref >=60)
Globulin: 2.6 g/dL (calc) (ref 1.9–3.7)
Glucose, Bld: 97 mg/dL (ref 65–99)
POTASSIUM: 3.9 mmol/L (ref 3.5–5.3)
Sodium: 136 mmol/L (ref 135–146)
Total Protein: 6.9 g/dL (ref 6.1–8.1)

## 2018-02-13 LAB — CBC WITH DIFFERENTIAL/PLATELET
Basophils Absolute: 38 cells/uL (ref 0–200)
Basophils Relative: 0.6 %
EOS ABS: 102 {cells}/uL (ref 15–500)
Eosinophils Relative: 1.6 %
HCT: 43.1 % (ref 35.0–45.0)
Hemoglobin: 14.7 g/dL (ref 11.7–15.5)
Lymphs Abs: 2208 cells/uL (ref 850–3900)
MCH: 27.6 pg (ref 27.0–33.0)
MCHC: 34.1 g/dL (ref 32.0–36.0)
MCV: 80.9 fL (ref 80.0–100.0)
MONOS PCT: 7 %
MPV: 11.7 fL (ref 7.5–12.5)
NEUTROS PCT: 56.3 %
Neutro Abs: 3603 cells/uL (ref 1500–7800)
PLATELETS: 253 10*3/uL (ref 140–400)
RBC: 5.33 10*6/uL — ABNORMAL HIGH (ref 3.80–5.10)
RDW: 13.9 % (ref 11.0–15.0)
TOTAL LYMPHOCYTE: 34.5 %
WBC: 6.4 10*3/uL (ref 3.8–10.8)
WBCMIX: 448 {cells}/uL (ref 200–950)

## 2018-02-13 LAB — LIPID PANEL
CHOLESTEROL: 207 mg/dL — AB (ref <200)
HDL: 56 mg/dL (ref >50)
LDL Cholesterol (Calc): 127 mg/dL (calc) — ABNORMAL HIGH
Non-HDL Cholesterol (Calc): 151 mg/dL (calc) — ABNORMAL HIGH (ref <130)
TRIGLYCERIDES: 125 mg/dL (ref <150)
Total CHOL/HDL Ratio: 3.7 (calc) (ref <5.0)

## 2018-02-13 LAB — HEMOGLOBIN A1C
EAG (MMOL/L): 6.5 (calc)
HEMOGLOBIN A1C: 5.7 %{Hb} — AB (ref <5.7)
MEAN PLASMA GLUCOSE: 117 (calc)

## 2018-02-13 LAB — VITAMIN D 25 HYDROXY (VIT D DEFICIENCY, FRACTURES): Vit D, 25-Hydroxy: 28 ng/mL — ABNORMAL LOW (ref 30–100)

## 2018-03-04 ENCOUNTER — Ambulatory Visit (INDEPENDENT_AMBULATORY_CARE_PROVIDER_SITE_OTHER): Payer: BC Managed Care – PPO | Admitting: Obstetrics and Gynecology

## 2018-03-04 ENCOUNTER — Encounter: Payer: Self-pay | Admitting: Obstetrics and Gynecology

## 2018-03-04 ENCOUNTER — Other Ambulatory Visit (HOSPITAL_COMMUNITY)
Admission: RE | Admit: 2018-03-04 | Discharge: 2018-03-04 | Disposition: A | Discharge status: Home / Self Care | Payer: BC Managed Care – PPO | Source: Ambulatory Visit | Attending: Obstetrics and Gynecology | Admitting: Obstetrics and Gynecology

## 2018-03-04 ENCOUNTER — Ambulatory Visit
Admission: RE | Admit: 2018-03-04 | Discharge: 2018-03-04 | Disposition: A | Discharge status: Home / Self Care | Payer: BC Managed Care – PPO | Source: Ambulatory Visit | Attending: Family Medicine | Admitting: Family Medicine

## 2018-03-04 VITALS — BP 130/83 | HR 66 | Ht 61.0 in | Wt 205.4 lb

## 2018-03-04 DIAGNOSIS — Z1231 Encounter for screening mammogram for malignant neoplasm of breast: Secondary | ICD-10-CM | POA: Diagnosis not present

## 2018-03-04 DIAGNOSIS — R7303 Prediabetes: Secondary | ICD-10-CM

## 2018-03-04 DIAGNOSIS — E668 Other obesity: Secondary | ICD-10-CM

## 2018-03-04 DIAGNOSIS — Z01419 Encounter for gynecological examination (general) (routine) without abnormal findings: Secondary | ICD-10-CM | POA: Insufficient documentation

## 2018-03-04 DIAGNOSIS — Z8619 Personal history of other infectious and parasitic diseases: Secondary | ICD-10-CM | POA: Diagnosis not present

## 2018-03-04 DIAGNOSIS — E7800 Pure hypercholesterolemia, unspecified: Secondary | ICD-10-CM

## 2018-03-04 DIAGNOSIS — I10 Essential (primary) hypertension: Secondary | ICD-10-CM

## 2018-03-04 DIAGNOSIS — Z1239 Encounter for other screening for malignant neoplasm of breast: Secondary | ICD-10-CM

## 2018-03-04 DIAGNOSIS — N951 Menopausal and female climacteric states: Secondary | ICD-10-CM

## 2018-03-04 NOTE — Progress Notes (Signed)
GYNECOLOGY ANNUAL PHYSICAL EXAM PROGRESS NOTE  Subjective:    Karen Donaldson is a 52 y.o. [id number] female who presents for an annual exam. The patient has no complaints today. The patient is sexually active. The patient wears seatbelts: yes. The patient participates in regular exercise: yes (2-3 times per week). Has the patient ever been transfused or tattooed?: no. The patient reports that there is not domestic violence in her life.   PCP Provider: Dr. Etter Sjogren, [facility]   Gynecologic History Menarche age: 47 Patient's last menstrual period was 12/28/2017.  Cycles are irregular.  Contraception: tubal ligation History of STI's: Denies  Last Pap: 02/2017. Results were: abnormal, NILM with HR HPV+.  History of prior abnormal pap smear, ASCUS HR HPV+ in 2016.  S/p Colposcopy with CIN I.   Last mammogram: 02/21/2017. Results were: normal.  Scheduled for today.  Last Colonoscopy: 01/2016.  Results were normal.   OB History  Gravida Para Term Preterm AB Living  2 2 2  0 0 2  SAB TAB Ectopic Multiple Live Births  0 0 0 0 2    # Outcome Date GA Lbr Len/2nd Weight Sex Delivery Anes PTL Lv  2 Term 2012   6 lb (2.722 kg) M CS-LTranv   LIV  1 Term 2009   5 lb 2.1 oz (2.327 kg) M CS-LTranv   LIV    Past Medical History:  Diagnosis Date  . Abnormal Pap smear of cervix 01/2015   ascus/pos  . Allergy   . Dyslipidemia   . High risk HPV infection   . Hyperglycemia   . Hypertension   . Hypertrophy, uterus   . Stress incontinence   . Vitamin D deficiency     Past Surgical History:  Procedure Laterality Date  . CESAREAN SECTION  2009, 2012  . COLONOSCOPY WITH PROPOFOL N/A 02/03/2016   Procedure: COLONOSCOPY WITH PROPOFOL;  Surgeon: Lucilla Lame, MD;  Location: [place];  Service: Endoscopy;  Laterality: N/A;  . TUBAL LIGATION      Family History  Problem Relation Age of Onset  . Hypertension Mother   . Diabetes Mother   . Hypertension Father     . Breast cancer Maternal Aunt   . Ovarian cancer Neg Hx   . Colon cancer Neg Hx   . Heart disease Neg Hx     Social History   Socioeconomic History  . Marital status: Married    Spouse name: Elta Guadeloupe  . Number of children: 2  . Years of education: Not on file  . Highest education level: Master's degree (e.g., MA, MS, MEng, MEd, MSW, MBA)  Occupational History  . Not on file  Social Needs  . Financial resource strain: Not hard at all  . Food insecurity:    Worry: Never true    Inability: Never true  . Transportation needs:    Medical: No    Non-medical: No  Tobacco Use  . Smoking status: Never Smoker  . Smokeless tobacco: Never Used  Substance and Sexual Activity  . Alcohol use: No    Alcohol/week: 0.0 standard drinks  . Drug use: No  . Sexual activity: Yes    Partners: Male    Birth control/protection: Surgical  Lifestyle  . Physical activity:    Days per week: 4 days    Minutes per session: 30 min  . Stress: Not at all  Relationships  . Social connections:    Talks on phone: More than three times  a week    Gets together: More than three times a week    Attends religious service: 1 to 4 times per year    Active member of club or organization: Yes    Attends meetings of clubs or organizations: 1 to 4 times per year    Relationship status: Married  . Intimate partner violence:    Fear of current or ex partner: No    Emotionally abused: No    Physically abused: No    Forced sexual activity: No  Other Topics Concern  . Not on file  Social History Narrative  . Not on file    Current Outpatient Medications on File Prior to Visit  Medication Sig Dispense Refill  . cetirizine (ZYRTEC) 10 MG tablet Take 1 tablet (10 mg total) by mouth daily. 30 tablet 0  . diltiazem (CARDIZEM CD) 180 MG 24 hr capsule Take 1 capsule (180 mg total) by mouth daily. 90 capsule 1  . FIBER PO Take by mouth daily as needed.    . hydrochlorothiazide (MICROZIDE) 12.5 MG capsule Take 1  capsule (12.5 mg total) by mouth every morning. 90 capsule 1  . meloxicam (MOBIC) 15 MG tablet Take 1 tablet (15 mg total) by mouth daily. 30 tablet 0  . montelukast (SINGULAIR) 10 MG tablet Take 1 tablet (10 mg total) by mouth daily. 90 tablet 1  . MULTIPLE VITAMIN PO Take 1 tablet by mouth daily.    . Calcium Carbonate-Vitamin D 600-400 MG-UNIT per chew tablet Chew 1 tablet by mouth daily.     No current facility-administered medications on file prior to visit.     Allergies  Allergen Reactions  . Ace Inhibitors   . Sulfamethoxazole-Trimethoprim Rash    Review of Systems Constitutional: negative for chills, fatigue, fevers and sweats Eyes: negative for irritation, redness and visual disturbance Ears, nose, mouth, throat, and face: negative for hearing loss, nasal congestion, snoring and tinnitus Respiratory: negative for asthma, cough, sputum Cardiovascular: negative for chest pain, dyspnea, exertional chest pressure/discomfort, irregular heart beat, palpitations and syncope Gastrointestinal: negative for abdominal pain, change in bowel habits, nausea and vomiting Genitourinary: positive for irregular menstrual periods (still occasionally skipping 2-3).  Negative for genital lesions, sexual problems and vaginal discharge, dysuria and urinary incontinence Integument/breast: negative for breast lump, breast tenderness and nipple discharge Hematologic/lymphatic: negative for bleeding and easy bruising Musculoskeletal:negative for back pain and muscle weakness Neurological: negative for dizziness, headaches, vertigo and weakness Endocrine: negative for diabetic symptoms including polydipsia, polyuria and skin dryness Allergic/Immunologic: negative for hay fever and urticaria      Objective:  Blood pressure 130/83, pulse 66, height 5' 1"  (1.549 m), weight 205 lb 6.4 oz (93.2 kg), last menstrual period 12/28/2017. Body mass index is 38.81 kg/m.    General Appearance:    Alert,  cooperative, no distress, appears stated age, moderately obese.   Head:    Normocephalic, without obvious abnormality, atraumatic  Eyes:    PERRL, conjunctiva/corneas clear, EOM's intact, both eyes  Ears:    Normal external ear canals, both ears  Nose:   Nares normal, septum midline, mucosa normal, no drainage or sinus tenderness  Throat:   Lips, mucosa, and tongue normal; teeth and gums normal  Neck:   Supple, symmetrical, trachea midline, no adenopathy; thyroid: no enlargement/tenderness/nodules; no carotid bruit or JVD  Back:     Symmetric, no curvature, ROM normal, no CVA tenderness  Lungs:     Clear to auscultation bilaterally, respirations unlabored  Chest Wall:  No tenderness or deformity   Heart:    Regular rate and rhythm, S1 and S2 normal, no murmur, rub or gallop  Breast Exam:    No tenderness, masses, or nipple abnormality  Abdomen:     Soft, non-tender, bowel sounds active all four quadrants, no masses, no organomegaly.    Genitalia:    Pelvic:external genitalia normal, vagina without lesions, discharge, or tenderness, rectovaginal septum  normal. Cervix normal in appearance, no cervical motion tenderness, no adnexal masses or tenderness.  Uterus normal size, shape, mobile, regular contours, nontender.  Rectal:    Normal external sphincter.  No hemorrhoids appreciated. Internal exam not done.   Extremities:   Extremities normal, atraumatic, no cyanosis or edema  Pulses:   2+ and symmetric all extremities  Skin:   Skin color, texture, turgor normal, no rashes or lesions  Lymph nodes:   Cervical, supraclavicular, and axillary nodes normal  Neurologic:   CNII-XII intact, normal strength, sensation and reflexes throughout   .  Labs:  Lab Results  Component Value Date   WBC 6.4 02/12/2018   HGB 14.7 02/12/2018   HCT 43.1 02/12/2018   MCV 80.9 02/12/2018   PLT 253 02/12/2018    Lab Results  Component Value Date   CREATININE 0.82 02/12/2018   BUN 13 02/12/2018   NA 136  02/12/2018   K 3.9 02/12/2018   CL 103 02/12/2018   CO2 25 02/12/2018    Lab Results  Component Value Date   ALT 16 02/12/2018   AST 15 02/12/2018   ALKPHOS 70 02/11/2017   BILITOT 0.3 02/12/2018    Lab Results  Component Value Date   CHOL 207 (H) 02/12/2018   HDL 56 02/12/2018   LDLCALC 127 (H) 02/12/2018   TRIG 125 02/12/2018   CHOLHDL 3.7 02/12/2018    No results found for: TSH  Lab Results  Component Value Date   HGBA1C 5.7 (H) 02/12/2018    Results for MILANY, GECK (MRN [id number]) as of 03/04/2018 08:34  Ref. Range 02/12/2018 09:06  Vitamin D, 25-Hydroxy Latest Ref Range: 30 - 100 ng/mL 28 (L)    Assessment:   Routine gynecologic exam.   H/o CIN I Benign HTN Hyperlipidemia Perimenopausal status Pre-diabetes  Plan:    Has had annual labs by PCP.  Benign HTN, pre-diabetes, hyperlipidemia managed by PCP.  Breast self exam technique reviewed and patient encouraged to perform self-exam monthly. Contraception: tubal ligation. Discussed healthy lifestyle modifications. Mammogram due, scheduled for today. Colonoscopy up to date (01/2016) . Pap smear performed today for h/o abnormal pap smear and CIN I on coloposcopy.   Perimenopausal status - reiterated diagnoses and expectations in upcoming years.  Follow up in 1 year.   Rubie Maid, MD [facility]

## 2018-03-04 NOTE — Patient Instructions (Signed)

## 2018-03-04 NOTE — Progress Notes (Signed)
Pt is present today for an annual exam. Pt stated that she is doing well no complaints.

## 2018-03-06 LAB — CYTOLOGY - PAP
Diagnosis: NEGATIVE
HPV (WINDOPATH): DETECTED — AB
HPV 16/18/45 GENOTYPING: POSITIVE — AB

## 2018-03-11 ENCOUNTER — Other Ambulatory Visit: Payer: Self-pay | Admitting: Family Medicine

## 2018-03-11 DIAGNOSIS — M26622 Arthralgia of left temporomandibular joint: Secondary | ICD-10-CM

## 2018-04-02 ENCOUNTER — Other Ambulatory Visit (HOSPITAL_COMMUNITY)
Admission: RE | Admit: 2018-04-02 | Discharge: 2018-04-02 | Disposition: A | Discharge status: Home / Self Care | Payer: BC Managed Care – PPO | Source: Ambulatory Visit | Attending: Obstetrics and Gynecology | Admitting: Obstetrics and Gynecology

## 2018-04-02 ENCOUNTER — Encounter: Payer: Self-pay | Admitting: Obstetrics and Gynecology

## 2018-04-02 ENCOUNTER — Ambulatory Visit (INDEPENDENT_AMBULATORY_CARE_PROVIDER_SITE_OTHER): Payer: BC Managed Care – PPO | Admitting: Obstetrics and Gynecology

## 2018-04-02 VITALS — BP 146/85 | HR 75 | Ht 61.0 in | Wt 204.2 lb

## 2018-04-02 DIAGNOSIS — R8789 Other abnormal findings in specimens from female genital organs: Secondary | ICD-10-CM | POA: Insufficient documentation

## 2018-04-02 DIAGNOSIS — R87618 Other abnormal cytological findings on specimens from cervix uteri: Secondary | ICD-10-CM

## 2018-04-02 DIAGNOSIS — R87810 Cervical high risk human papillomavirus (HPV) DNA test positive: Secondary | ICD-10-CM | POA: Diagnosis not present

## 2018-04-02 NOTE — Patient Instructions (Signed)

## 2018-04-02 NOTE — Addendum Note (Signed)
Addended by: Edwyna Shell on: 04/02/2018 12:14 PM   Modules accepted: Orders

## 2018-04-02 NOTE — Progress Notes (Signed)
     GYNECOLOGY CLINIC COLPOSCOPY PROCEDURE NOTE  52 y.o. [id number] here for colposcopy for NILM, but HR HPV positive (type 16) pap smear on 03/04/2018. Discussed role for HPV in cervical dysplasia, need for surveillance.  Patient given informed consent, signed copy in the chart, time out was performed.  Placed in lithotomy position. Cervix viewed with speculum and colposcope after application of acetic acid.   Colposcopy adequate? Yes  no visible lesions, no mosaicism, no punctation and no abnormal vasculature; no corresponding biopsies obtained.  ECC specimen was obtained. All specimens were labeled and sent to pathology.   Patient was given post procedure instructions.  Will follow up pathology and manage accordingly; patient will be contacted with results and recommendations.  Routine preventative health maintenance measures emphasized.    Rubie Maid, MD [facility]

## 2018-04-02 NOTE — Progress Notes (Signed)
   PT is present today for a colpo. Pt stated that she is doing well no complaints.

## 2018-04-08 ENCOUNTER — Telehealth: Payer: Self-pay | Admitting: Obstetrics and Gynecology

## 2018-04-08 NOTE — Telephone Encounter (Signed)
Pt is aware of her test results.

## 2018-04-08 NOTE — Telephone Encounter (Signed)
The patient was returning the nurse's call today; desires call back, please advise, thanks.

## 2018-05-30 ENCOUNTER — Encounter: Payer: Self-pay | Admitting: Nurse Practitioner

## 2018-05-30 ENCOUNTER — Ambulatory Visit: Payer: BC Managed Care – PPO | Admitting: Nurse Practitioner

## 2018-05-30 VITALS — BP 120/80 | HR 98 | Temp 97.8°F | Resp 16 | Ht 61.0 in | Wt 204.4 lb

## 2018-05-30 DIAGNOSIS — J029 Acute pharyngitis, unspecified: Secondary | ICD-10-CM | POA: Diagnosis not present

## 2018-05-30 DIAGNOSIS — R0981 Nasal congestion: Secondary | ICD-10-CM | POA: Diagnosis not present

## 2018-05-30 DIAGNOSIS — R05 Cough: Secondary | ICD-10-CM

## 2018-05-30 DIAGNOSIS — R059 Cough, unspecified: Secondary | ICD-10-CM

## 2018-05-30 MED ORDER — AZITHROMYCIN 250 MG PO TABS: ORAL_TABLET | ORAL | 0 refills | Status: DC

## 2018-05-30 MED ORDER — BENZONATATE 100 MG PO CAPS: 100.0000 mg | ORAL_CAPSULE | Freq: Three times a day (TID) | ORAL | 0 refills | Status: DC | PRN

## 2018-05-30 MED ORDER — MAGIC MOUTHWASH W/LIDOCAINE: 5.0000 mL | Freq: Every evening | ORAL | 0 refills | Status: DC | PRN

## 2018-05-30 MED ORDER — FLUTICASONE PROPIONATE 50 MCG/ACT NA SUSP
2.0000 | Freq: Every day | NASAL | 6 refills | Status: AC
Start: 2018-05-30 — End: ?

## 2018-05-30 NOTE — Progress Notes (Signed)
Name: Karen Donaldson   MRN: [id number]    DOB: [birth year]   Date:05/30/2018       Progress Note  Subjective  Chief Complaint  Chief Complaint  Patient presents with  . Cough  . Nasal Congestion    HPI  Patient endorses sore throat, cough, nasal congestion and sinus pressure that has been ongoing for 1.5 weeks with sudden worsening of symptoms. productive yellow-green.   Denies fevers, chills, nausea, vomiting, shortness of breath or chest pain, muffled sounds.   Has tried alka-seltzer, sinus rinse and cough drops and vicks vapor rub.   Patient Active Problem List   Diagnosis Date Noted  . CIN I (cervical intraepithelial neoplasia I) 02/22/2015  . Benign essential HTN 02/06/2015  . Dyslipidemia 02/06/2015  . Blood glucose elevated 02/06/2015  . Obesity (BMI 30-39.9) 02/06/2015  . Perennial allergic rhinitis with seasonal variation 02/06/2015  . Stress incontinence 02/06/2015  . Vitamin D deficiency 02/06/2015    Past Medical History:  Diagnosis Date  . Abnormal Pap smear of cervix 01/2015   ascus/pos  . Allergy   . Dyslipidemia   . High risk HPV infection   . Hyperglycemia   . Hypertension   . Hypertrophy, uterus   . Stress incontinence   . Vitamin D deficiency     Past Surgical History:  Procedure Laterality Date  . CESAREAN SECTION  2009, 2012  . COLONOSCOPY WITH PROPOFOL N/A 02/03/2016   Procedure: COLONOSCOPY WITH PROPOFOL;  Surgeon: Lucilla Lame, MD;  Location: [place];  Service: Endoscopy;  Laterality: N/A;  . TUBAL LIGATION      Social History   Tobacco Use  . Smoking status: Never Smoker  . Smokeless tobacco: Never Used  Substance Use Topics  . Alcohol use: No    Alcohol/week: 0.0 standard drinks     Current Outpatient Medications:  .  Calcium Carbonate-Vitamin D 600-400 MG-UNIT per chew tablet, Chew 1 tablet by mouth daily., Disp: , Rfl:  .  cetirizine (ZYRTEC) 10 MG tablet, Take 1 tablet (10 mg total) by mouth daily., Disp: 30  tablet, Rfl: 0 .  diltiazem (CARDIZEM CD) 180 MG 24 hr capsule, Take 1 capsule (180 mg total) by mouth daily., Disp: 90 capsule, Rfl: 1 .  FIBER PO, Take by mouth daily as needed., Disp: , Rfl:  .  hydrochlorothiazide (MICROZIDE) 12.5 MG capsule, Take 1 capsule (12.5 mg total) by mouth every morning., Disp: 90 capsule, Rfl: 1 .  montelukast (SINGULAIR) 10 MG tablet, Take 1 tablet (10 mg total) by mouth daily., Disp: 90 tablet, Rfl: 1 .  MULTIPLE VITAMIN PO, Take 1 tablet by mouth daily., Disp: , Rfl:   Allergies  Allergen Reactions  . Ace Inhibitors   . Sulfamethoxazole-Trimethoprim Rash    ROS   No other specific complaints in a complete review of systems (except as listed in HPI above).  Objective  Vitals:   05/30/18 0817  BP: 120/80  Pulse: 98  Resp: 16  Temp: 97.8 F (36.6 C)  TempSrc: Oral  SpO2: 98%  Weight: 204 lb 6.4 oz (92.7 kg)  Height: 5' 1"  (1.549 m)    Body mass index is 38.62 kg/m.  Nursing Note and Vital Signs reviewed.  Physical Exam  HENT:  Head: Normocephalic and atraumatic.  Right Ear: Hearing, tympanic membrane, external ear and ear canal normal.  Left Ear: Hearing, tympanic membrane, external ear and ear canal normal.  Nose: Mucosal edema present. No rhinorrhea or sinus tenderness. Right sinus exhibits frontal  sinus tenderness. Right sinus exhibits no maxillary sinus tenderness. Left sinus exhibits frontal sinus tenderness. Left sinus exhibits no maxillary sinus tenderness.  Mouth/Throat: Uvula is midline, oropharynx is clear and moist and mucous membranes are normal. No oropharyngeal exudate.  Eyes: Conjunctivae are normal. Right eye exhibits no discharge. Left eye exhibits no discharge.  Neck: Normal range of motion.  Cardiovascular: Normal rate and regular rhythm.  Pulmonary/Chest: Effort normal and breath sounds normal.  Lymphadenopathy:    She has no cervical adenopathy.  Neurological: She is alert.  Skin: Skin is warm and dry. No rash  noted.  Psychiatric: She has a normal mood and affect. Judgment normal.       No results found for this or any previous visit (from the past 48 hour(s)).  Assessment & Plan  1. Cough - azithromycin (ZITHROMAX) 250 MG tablet; 2 tabs today and 1 tablet for the next 4 days.  Dispense: 6 tablet; Refill: 0 - benzonatate (TESSALON PERLES) 100 MG capsule; Take 1 capsule (100 mg total) by mouth 3 (three) times daily as needed for cough.  Dispense: 20 capsule; Refill: 0  2. Nasal congestion - fluticasone (FLONASE) 50 MCG/ACT nasal spray; Place 2 sprays into both nostrils daily.  Dispense: 16 g; Refill: 6  3. Sore throat - magic mouthwash w/lidocaine SOLN; Take 5 mLs by mouth at bedtime as needed for mouth pain.  Dispense: 100 mL; Refill: 0   -Red flags and when to present for emergency care or RTC including fever >101.12F, chest pain, shortness of breath, new/worsening/un-resolving symptoms, reviewed with patient at time of visit. Follow up and care instructions discussed and provided in AVS.

## 2018-08-15 ENCOUNTER — Encounter: Payer: Self-pay | Admitting: Family Medicine

## 2018-08-15 ENCOUNTER — Ambulatory Visit: Payer: BC Managed Care – PPO | Admitting: Family Medicine

## 2018-08-15 VITALS — BP 130/90 | HR 85 | Temp 97.5°F | Resp 16 | Ht 61.0 in | Wt 202.9 lb

## 2018-08-15 DIAGNOSIS — J3089 Other allergic rhinitis: Secondary | ICD-10-CM | POA: Diagnosis not present

## 2018-08-15 DIAGNOSIS — I10 Essential (primary) hypertension: Secondary | ICD-10-CM | POA: Diagnosis not present

## 2018-08-15 DIAGNOSIS — E785 Hyperlipidemia, unspecified: Secondary | ICD-10-CM

## 2018-08-15 DIAGNOSIS — E669 Obesity, unspecified: Secondary | ICD-10-CM | POA: Diagnosis not present

## 2018-08-15 DIAGNOSIS — J029 Acute pharyngitis, unspecified: Secondary | ICD-10-CM

## 2018-08-15 DIAGNOSIS — J302 Other seasonal allergic rhinitis: Secondary | ICD-10-CM

## 2018-08-15 DIAGNOSIS — K5909 Other constipation: Secondary | ICD-10-CM

## 2018-08-15 MED ORDER — DILTIAZEM HCL ER COATED BEADS 180 MG PO CP24: 180.0000 mg | ORAL_CAPSULE | Freq: Every day | ORAL | 1 refills | Status: DC

## 2018-08-15 MED ORDER — MONTELUKAST SODIUM 10 MG PO TABS: 10.0000 mg | ORAL_TABLET | Freq: Every day | ORAL | 1 refills | Status: DC

## 2018-08-15 MED ORDER — HYDROCHLOROTHIAZIDE 12.5 MG PO CAPS: 12.5000 mg | ORAL_CAPSULE | Freq: Every morning | ORAL | 1 refills | Status: DC

## 2018-08-15 NOTE — Patient Instructions (Signed)
Coat throat at [employer]

## 2018-08-15 NOTE — Progress Notes (Signed)
Name: Karen Donaldson   MRN: [id number]    DOB: 1965/09/06   Date:08/15/2018       Progress Note  Subjective  Chief Complaint  Chief Complaint  Patient presents with  . Hypertension  . Sore Throat    HPI  HTN: patient states she has been compliant with her medication, no chest pain, no palpitation, no edema.BP at home is within normal limits, but today bp is slightly elevated at 130/90, however previous visits bp has been at goal. We will continue current regiment   Chronic constipation: BM's have been  regular - she has a bowel movement every 2 days Symptoms beganhas been since childhood and no longer has to strain. Controlled with fiber supplements, dietary fiber and exercise. She has hemorrhoids, but no recent bleeding, colonoscopy is up to date - done in 2017   HistoryCIN I with HPV: seesDr. Leeanne Rio colposcopyin 03/2015, repeat pap smear was normal 02/2017, but positive HPV, she had endocervical curettage and was negative, she went back 02/2018 and had same findings, and biopsy also negative, she will go back in one year for repeat pap smear.   AR: she has intermittent symptoms, she has episodes of nasal congestion ( present now) , and a dry cough,usually triggered by weather changes.Stopping using flonase but still on singulair, and switched to claritin on her own, stable.   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 She states that past two days she has noticed sore throat, described as scratchy, no fever or chills. She also has nasal congestion and some drainage when she blows her nose  Hyperlipidemia: she is not taking medication, last LDL was 127, on life style modification only. The 10-year ASCVD risk score Mikey Bussing DC Brooke Bonito., et al., 2013) is: 4.3%   Values used to calculate the score:     Age: 53 years     Sex: Female     Is Non-Hispanic African American: Yes     Diabetic: No     Tobacco smoker:  No     Systolic Blood Pressure: 099 mmHg     Is BP treated: Yes     HDL Cholesterol: 56 mg/dL     Total Cholesterol: 207 mg/dL  Hyperglycemia: she denies polyphagia, polyuria or polydipsia. Last hgbA1C 5.7%   Obesity:she has lost 3 lbs since the Summer, she is not eating after 7 pm, she eats at least 3 servings of fruit and vegetables per day.    Patient Active Problem List   Diagnosis Date Noted  . CIN I (cervical intraepithelial neoplasia I) 02/22/2015  . Benign essential HTN 02/06/2015  . Dyslipidemia 02/06/2015  . Blood glucose elevated 02/06/2015  . Obesity (BMI 30-39.9) 02/06/2015  . Perennial allergic rhinitis with seasonal variation 02/06/2015  . Stress incontinence 02/06/2015  . Vitamin D deficiency 02/06/2015    Past Surgical History:  Procedure Laterality Date  . CESAREAN SECTION  2009, 2012  . COLONOSCOPY WITH PROPOFOL N/A 02/03/2016   Procedure: COLONOSCOPY WITH PROPOFOL;  Surgeon: Lucilla Lame, MD;  Location: [place];  Service: Endoscopy;  Laterality: N/A;  . TUBAL LIGATION      Family History  Problem Relation Age of Onset  . Hypertension Mother   . Diabetes Mother   . Dementia Mother   . Hypertension Father   . Breast cancer Maternal Aunt   . Ovarian cancer Neg Hx   . Colon cancer Neg Hx   .  Heart disease Neg Hx     Social History   Socioeconomic History  . Marital status: Married    Spouse name: Elta Guadeloupe  . Number of children: 2  . Years of education: Not on file  . Highest education level: Master's degree (e.g., MA, MS, MEng, MEd, MSW, MBA)  Occupational History  . Not on file  Social Needs  . Financial resource strain: Not hard at all  . Food insecurity:    Worry: Never true    Inability: Never true  . Transportation needs:    Medical: No    Non-medical: No  Tobacco Use  . Smoking status: Never Smoker  . Smokeless tobacco: Never Used  Substance and Sexual Activity  . Alcohol use: No    Alcohol/week: 0.0 standard drinks  .  Drug use: No  . Sexual activity: Yes    Partners: Male    Birth control/protection: Surgical  Lifestyle  . Physical activity:    Days per week: 4 days    Minutes per session: 30 min  . Stress: Not at all  Relationships  . Social connections:    Talks on phone: More than three times a week    Gets together: More than three times a week    Attends religious service: 1 to 4 times per year    Active member of club or organization: Yes    Attends meetings of clubs or organizations: 1 to 4 times per year    Relationship status: Married  . Intimate partner violence:    Fear of current or ex partner: No    Emotionally abused: No    Physically abused: No    Forced sexual activity: No  Other Topics Concern  . Not on file  Social History Narrative  . Not on file     Current Outpatient Medications:  .  Calcium Carbonate-Vitamin D 600-400 MG-UNIT per chew tablet, Chew 1 tablet by mouth daily., Disp: , Rfl:  .  diltiazem (CARDIZEM CD) 180 MG 24 hr capsule, Take 1 capsule (180 mg total) by mouth daily., Disp: 90 capsule, Rfl: 1 .  FIBER PO, Take by mouth daily as needed., Disp: , Rfl:  .  fluticasone (FLONASE) 50 MCG/ACT nasal spray, Place 2 sprays into both nostrils daily., Disp: 16 g, Rfl: 6 .  hydrochlorothiazide (MICROZIDE) 12.5 MG capsule, Take 1 capsule (12.5 mg total) by mouth every morning., Disp: 90 capsule, Rfl: 1 .  loratadine (CLARITIN) 10 MG tablet, Take 10 mg by mouth daily., Disp: , Rfl:  .  montelukast (SINGULAIR) 10 MG tablet, Take 1 tablet (10 mg total) by mouth daily., Disp: 90 tablet, Rfl: 1 .  MULTIPLE VITAMIN PO, Take 1 tablet by mouth daily., Disp: , Rfl:   Allergies  Allergen Reactions  . Ace Inhibitors   . Sulfamethoxazole-Trimethoprim Rash    I personally reviewed active problem list, medication list, allergies, family history, social history with the patient/caregiver today.   ROS  Constitutional: Negative for fever or weight change.  Respiratory:  Negative for cough and shortness of breath.   Cardiovascular: Negative for chest pain or palpitations.  Gastrointestinal: Negative for abdominal pain, no bowel changes.  Musculoskeletal: Negative for gait problem or joint swelling.  Skin: Negative for rash.  Neurological: Negative for dizziness or headache.  No other specific complaints in a complete review of systems (except as listed in HPI above).  Objective  Vitals:   08/15/18 0749  BP: 130/90  Pulse: 85  Resp: 16  Temp: (!)  97.5 F (36.4 C)  TempSrc: Oral  SpO2: 98%  Weight: 202 lb 14.4 oz (92 kg)  Height: 5' 1"  (1.549 m)    Body mass index is 38.34 kg/m.  Physical Exam  Constitutional: Patient appears well-developed and well-nourished. Obese  No distress.  HEENT: head atraumatic, normocephalic, pupils equal and reactive to light, ear: normal TM bilaterally , neck supple, throat showed small ulceration on tonsils and erythema, no palate petechia  Cardiovascular: Normal rate, regular rhythm and normal heart sounds.  No murmur heard. No BLE edema. Pulmonary/Chest: Effort normal and breath sounds normal. No respiratory distress. Abdominal: Soft.  There is no tenderness. Psychiatric: Patient has a normal mood and affect. behavior is normal. Judgment and thought content normal.  PHQ2/9: Depression screen [facility] 2/9 08/15/2018 02/12/2018 08/14/2017 08/13/2016 02/08/2016  Decreased Interest 0 0 0 0 0  Down, Depressed, Hopeless 0 0 0 0 0  PHQ - 2 Score 0 0 0 0 0  Altered sleeping - 0 - - -  Tired, decreased energy - 0 - - -  Change in appetite - 0 - - -  Feeling bad or failure about yourself  - 0 - - -  Trouble concentrating - 0 - - -  Suicidal thoughts - 0 - - -  PHQ-9 Score - 0 - - -     Fall Risk: Fall Risk  08/15/2018 05/30/2018 02/12/2018 08/14/2017 08/13/2016  Falls in the past year? 0 0 No No No  Number falls in past yr: 0 - - - -  Injury with Fall? 0 - - - -    Assessment & Plan   1. Benign essential HTN  -  diltiazem (CARDIZEM CD) 180 MG 24 hr capsule; Take 1 capsule (180 mg total) by mouth daily.  Dispense: 90 capsule; Refill: 1 - hydrochlorothiazide (MICROZIDE) 12.5 MG capsule; Take 1 capsule (12.5 mg total) by mouth every morning.  Dispense: 90 capsule; Refill: 1  2. Perennial allergic rhinitis with seasonal variation  - montelukast (SINGULAIR) 10 MG tablet; Take 1 tablet (10 mg total) by mouth daily.  Dispense: 90 tablet; Refill: 1  3. Dyslipidemia  Advised to increase fruit and vegetable intake   4. Obesity (BMI 30-39.9)  Discussed with the patient the risk posed by an increased BMI. Discussed importance of portion control, calorie counting and at least 150 minutes of physical activity weekly. Avoid sweet beverages and drink more water. Eat at least 6 servings of fruit and vegetables daily   5. Sore throat  Explained likely viral and discussed throat coat tea, rest and fluids  6. Chronic constipation  Continue life style modification

## 2018-09-01 ENCOUNTER — Encounter: Payer: Self-pay | Admitting: Emergency Medicine

## 2018-09-01 ENCOUNTER — Emergency Department: Payer: BC Managed Care – PPO

## 2018-09-01 ENCOUNTER — Ambulatory Visit: Payer: Self-pay | Admitting: *Deleted

## 2018-09-01 ENCOUNTER — Emergency Department
Admission: EM | Admit: 2018-09-01 | Discharge: 2018-09-01 | Disposition: A | Discharge status: Home / Self Care | Payer: BC Managed Care – PPO | Attending: Emergency Medicine | Admitting: Emergency Medicine

## 2018-09-01 ENCOUNTER — Other Ambulatory Visit: Payer: Self-pay

## 2018-09-01 DIAGNOSIS — I10 Essential (primary) hypertension: Secondary | ICD-10-CM | POA: Insufficient documentation

## 2018-09-01 DIAGNOSIS — K529 Noninfective gastroenteritis and colitis, unspecified: Secondary | ICD-10-CM | POA: Diagnosis not present

## 2018-09-01 DIAGNOSIS — K625 Hemorrhage of anus and rectum: Secondary | ICD-10-CM | POA: Insufficient documentation

## 2018-09-01 DIAGNOSIS — R197 Diarrhea, unspecified: Secondary | ICD-10-CM | POA: Diagnosis present

## 2018-09-01 DIAGNOSIS — R109 Unspecified abdominal pain: Secondary | ICD-10-CM | POA: Diagnosis not present

## 2018-09-01 DIAGNOSIS — R112 Nausea with vomiting, unspecified: Secondary | ICD-10-CM | POA: Insufficient documentation

## 2018-09-01 DIAGNOSIS — Z79899 Other long term (current) drug therapy: Secondary | ICD-10-CM | POA: Insufficient documentation

## 2018-09-01 LAB — COMPREHENSIVE METABOLIC PANEL
ALT: 19 U/L (ref 0–44)
AST: 22 U/L (ref 15–41)
Albumin: 4.2 g/dL (ref 3.5–5.0)
Alkaline Phosphatase: 85 U/L (ref 38–126)
Anion gap: 8 (ref 5–15)
BUN: 15 mg/dL (ref 6–20)
CO2: 26 mmol/L (ref 22–32)
Calcium: 9.7 mg/dL (ref 8.9–10.3)
Chloride: 99 mmol/L (ref 98–111)
Creatinine, Ser: 0.75 mg/dL (ref 0.44–1.00)
GFR calc non Af Amer: >60 mL/min (ref >60)
Glucose, Bld: 132 mg/dL — ABNORMAL HIGH (ref 70–99)
Potassium: 3.9 mmol/L (ref 3.5–5.1)
Sodium: 133 mmol/L — ABNORMAL LOW (ref 135–145)
Total Bilirubin: 0.8 mg/dL (ref 0.3–1.2)
Total Protein: 7.8 g/dL (ref 6.5–8.1)

## 2018-09-01 LAB — CBC
HCT: 46.2 % — ABNORMAL HIGH (ref 36.0–46.0)
Hemoglobin: 15.5 g/dL — ABNORMAL HIGH (ref 12.0–15.0)
MCH: 27.8 pg (ref 26.0–34.0)
MCHC: 33.5 g/dL (ref 30.0–36.0)
MCV: 82.8 fL (ref 80.0–100.0)
NRBC: 0 % (ref 0.0–0.2)
Platelets: 312 10*3/uL (ref 150–400)
RBC: 5.58 MIL/uL — AB (ref 3.87–5.11)
RDW: 12.9 % (ref 11.5–15.5)
WBC: 19.5 10*3/uL — ABNORMAL HIGH (ref 4.0–10.5)

## 2018-09-01 LAB — URINALYSIS, COMPLETE (UACMP) WITH MICROSCOPIC
Bilirubin Urine: NEGATIVE
Glucose, UA: NEGATIVE mg/dL
Hgb urine dipstick: NEGATIVE
Ketones, ur: NEGATIVE mg/dL
LEUKOCYTES UA: NEGATIVE
NITRITE: NEGATIVE
Protein, ur: NEGATIVE mg/dL
SPECIFIC GRAVITY, URINE: 1.028 (ref 1.005–1.030)
pH: 6 (ref 5.0–8.0)

## 2018-09-01 LAB — HEMOGLOBIN AND HEMATOCRIT, BLOOD
HCT: 46.1 % — ABNORMAL HIGH (ref 36.0–46.0)
Hemoglobin: 15.3 g/dL — ABNORMAL HIGH (ref 12.0–15.0)

## 2018-09-01 LAB — C DIFFICILE QUICK SCREEN W PCR REFLEX
C Diff antigen: NEGATIVE
C Diff interpretation: NOT DETECTED
C Diff toxin: NEGATIVE

## 2018-09-01 LAB — TYPE AND SCREEN
ABO/RH(D): O POS
ANTIBODY SCREEN: NEGATIVE

## 2018-09-01 LAB — LIPASE, BLOOD: Lipase: 21 U/L (ref 11–51)

## 2018-09-01 MED ORDER — METRONIDAZOLE 500 MG PO TABS: 500.0000 mg | ORAL_TABLET | Freq: Three times a day (TID) | ORAL | 0 refills | Status: AC

## 2018-09-01 MED ORDER — AMOXICILLIN-POT CLAVULANATE 875-125 MG PO TABS: 1.0000 | ORAL_TABLET | Freq: Two times a day (BID) | ORAL | 0 refills | Status: AC

## 2018-09-01 MED ORDER — PIPERACILLIN-TAZOBACTAM 3.375 G IVPB 30 MIN
3.3750 g | Freq: Once | INTRAVENOUS | Status: AC
  Administered 2018-09-01: 3.375 g via INTRAVENOUS
  Filled 2018-09-01: qty 50

## 2018-09-01 MED ORDER — ONDANSETRON 4 MG PO TBDP: 4.0000 mg | ORAL_TABLET | Freq: Three times a day (TID) | ORAL | 0 refills | Status: DC | PRN

## 2018-09-01 MED ORDER — ONDANSETRON HCL 4 MG/2ML IJ SOLN
4.0000 mg | Freq: Once | INTRAMUSCULAR | Status: AC
  Administered 2018-09-01: 4 mg via INTRAVENOUS
  Filled 2018-09-01: qty 2

## 2018-09-01 MED ORDER — KETOROLAC TROMETHAMINE 30 MG/ML IJ SOLN
15.0000 mg | Freq: Once | INTRAMUSCULAR | Status: AC
  Administered 2018-09-01: 15 mg via INTRAVENOUS
  Filled 2018-09-01: qty 1

## 2018-09-01 MED ORDER — IOHEXOL 300 MG/ML  SOLN
100.0000 mL | Freq: Once | INTRAMUSCULAR | Status: AC | PRN
  Administered 2018-09-01: 100 mL via INTRAVENOUS

## 2018-09-01 NOTE — ED Notes (Signed)
Pt requested ginger ale. Dr Alfred Levins notified and approved. Ginger ale provided to pt. Pt resting comfortably.

## 2018-09-01 NOTE — Telephone Encounter (Signed)
She should go to [facility] for evaluation and possible fluids

## 2018-09-01 NOTE — ED Triage Notes (Signed)
Started yesterday with lower abdominal pain and vomiting. Does have history of internal hemorrhoids.  Now having bloody bowel movements.  Pt describes as light red blood. No fevers.  Ambulatory.  Unlabored.  No blood in vomit.

## 2018-09-01 NOTE — Telephone Encounter (Signed)
Message from Luciana Axe sent at 09/01/2018 1:13 PM EST   Patient is calling for advise. Patient believes that she may have food poisoning or a virus. Please advise     Returned call to pt who states she has been experiencing abdominal pain and bloody stools since this morning around 12 or 1 am. Pt states she also had one episode of vomiting. Pt states her stools were diarrhea and she saw light red blood, not pink in the toilet. Pt states that she has had approximately 5 bloody stools and the past couple of stools have been only blood with no formed stool. Pt also notes that she has internal hemorrhoids. Pt states she feels week and tired. Pt advised to seek treatment in the ED. Pt verbalized understanding.  Abdominal pain and bloody stools.   Reason for Disposition . [1] MODERATE rectal bleeding (small blood clots, passing blood without stool, or toilet water turns red) AND [2] more than once a day  Answer Assessment - Initial Assessment Questions 1. APPEARANCE of BLOOD: "What color is it?" "Is it passed separately, on the surface of the stool, or mixed in with the stool?"      Light red mixed in with the stool first but the past couple of times have just been blood in the stool 2. AMOUNT: "How much blood was passed?"      Amount not specified pt states she did see the blood in the toilet 3. FREQUENCY: "How many times has blood been passed with the stools?"      Pt states approximately 5 times 4. ONSET: "When was the blood first seen in the stools?" (Days or weeks)      Today around 12 or 1 am 5. DIARRHEA: "Is there also some diarrhea?" If so, ask: "How many diarrhea stools were passed in past 24 hours?"       diarrhea and 5 stools this morning 6. CONSTIPATION: "Do you have constipation?" If so, "How bad is it?"     No 7. RECURRENT SYMPTOMS: "Have you had blood in your stools before?" If so, ask: "When was the last time?" and "What happened that time?"      No 8. BLOOD THINNERS: "Do  you take any blood thinners?" (e.g., Coumadin/warfarin, Pradaxa/dabigatran, aspirin)     No 9. OTHER SYMPTOMS: "Do you have any other symptoms?"  (e.g., abdominal pain, vomiting dizziness, fever)     Abdominal, voimiting once this morning, weak and tired 10. PREGNANCY: "Is there any chance you are pregnant?" "When was your last menstrual period?"       No chance of pregnancy, but states she did have a cycle in Jan  Protocols used: RECTAL BLEEDING-A-AH

## 2018-09-01 NOTE — ED Triage Notes (Signed)
FIRST NURSE NOTE-started with vomiting but now having bloody stools. Ambulatory. NAD.

## 2018-09-01 NOTE — ED Provider Notes (Signed)
[facility] Emergency Department Provider Note  ____________________________________________  Time seen: Approximately 4:54 PM  I have reviewed the triage vital signs and the nursing notes.   HISTORY  Chief Complaint GI Bleeding   HPI Karen Donaldson is a 53 y.o. female with h/o internal hemorrhoids, HLD, HTN who presents for evaluation of diarrhea and rectal bleeding.  Patient reports that her symptoms started at midnight.  She reports 7 episodes of diarrhea initially nonbloody.  The last 3 episodes she reports significant amount of bright red blood.  No rectal pain.  She is also complaining of sharp constant 5 out of 10 abdominal pain.  She has nausea and had one episode of nonbloody nonbilious emesis.  No fever or chills.  No prior history of GI bleed.  She is not on blood thinners.  No personal family history of colon cancer.  No recent antibiotic use or history of C. Difficile.  Past Medical History:  Diagnosis Date  . Abnormal Pap smear of cervix 01/2015   ascus/pos  . Allergy   . Dyslipidemia   . High risk HPV infection   . Hyperglycemia   . Hypertension   . Hypertrophy, uterus   . Stress incontinence   . Vitamin D deficiency     Patient Active Problem List   Diagnosis Date Noted  . CIN I (cervical intraepithelial neoplasia I) 02/22/2015  . Benign essential HTN 02/06/2015  . Dyslipidemia 02/06/2015  . Blood glucose elevated 02/06/2015  . Obesity (BMI 30-39.9) 02/06/2015  . Perennial allergic rhinitis with seasonal variation 02/06/2015  . Stress incontinence 02/06/2015  . Vitamin D deficiency 02/06/2015    Past Surgical History:  Procedure Laterality Date  . CESAREAN SECTION  2009, 2012  . COLONOSCOPY WITH PROPOFOL N/A 02/03/2016   Procedure: COLONOSCOPY WITH PROPOFOL;  Surgeon: Lucilla Lame, MD;  Location: [facility];  Service: Endoscopy;  Laterality: N/A;  . TUBAL LIGATION      Prior to Admission medications     Medication Sig Start Date End Date Taking? Authorizing Provider  amoxicillin-clavulanate (AUGMENTIN) 875-125 MG tablet Take 1 tablet by mouth 2 (two) times daily for 7 days. 09/01/18 09/08/18  Rudene Re, MD  Calcium Carbonate-Vitamin D 600-400 MG-UNIT per chew tablet Chew 1 tablet by mouth daily.    [provider]  diltiazem (CARDIZEM CD) 180 MG 24 hr capsule Take 1 capsule (180 mg total) by mouth daily. 08/15/18   Steele Sizer, MD  FIBER PO Take by mouth daily as needed.    [provider]  fluticasone (FLONASE) 50 MCG/ACT nasal spray Place 2 sprays into both nostrils daily. 05/30/18   Poulose, Bethel Born, NP  hydrochlorothiazide (MICROZIDE) 12.5 MG capsule Take 1 capsule (12.5 mg total) by mouth every morning. 08/15/18   Steele Sizer, MD  loratadine (CLARITIN) 10 MG tablet Take 10 mg by mouth daily.    [provider]  metroNIDAZOLE (FLAGYL) 500 MG tablet Take 1 tablet (500 mg total) by mouth 3 (three) times daily for 7 days. 09/01/18 09/08/18  Rudene Re, MD  montelukast (SINGULAIR) 10 MG tablet Take 1 tablet (10 mg total) by mouth daily. 08/15/18   Steele Sizer, MD  MULTIPLE VITAMIN PO Take 1 tablet by mouth daily. 01/12/09   [provider]  ondansetron (ZOFRAN ODT) 4 MG disintegrating tablet Take 1 tablet (4 mg total) by mouth every 8 (eight) hours as needed. 09/01/18   Rudene Re, MD    Allergies Ace inhibitors and Sulfamethoxazole-trimethoprim  Family  History  Problem Relation Age of Onset  . Hypertension Mother   . Diabetes Mother   . Dementia Mother   . Hypertension Father   . Breast cancer Maternal Aunt   . Ovarian cancer Neg Hx   . Colon cancer Neg Hx   . Heart disease Neg Hx     Social History Social History   Tobacco Use  . Smoking status: Never Smoker  . Smokeless tobacco: Never Used  Substance Use Topics  . Alcohol use: No    Alcohol/week: 0.0 standard drinks  . Drug use: No    Review of  Systems  Constitutional: Negative for fever. Eyes: Negative for visual changes. ENT: Negative for sore throat. Neck: No neck pain  Cardiovascular: Negative for chest pain. Respiratory: Negative for shortness of breath. Gastrointestinal: + abdominal pain, vomiting, diarrhea, and rectal bleeding Genitourinary: Negative for dysuria. Musculoskeletal: Negative for back pain. Skin: Negative for rash. Neurological: Negative for headaches, weakness or numbness. Psych: No SI or HI  ____________________________________________   PHYSICAL EXAM:  VITAL SIGNS: ED Triage Vitals  Enc Vitals Group     BP 09/01/18 1524 (!) 151/100     Pulse Rate 09/01/18 1524 96     Resp 09/01/18 1524 18     Temp 09/01/18 1524 98.3 F (36.8 C)     Temp Source 09/01/18 1524 Oral     SpO2 09/01/18 1524 98 %     Weight 09/01/18 1515 203 lb (92.1 kg)     Height 09/01/18 1515 5' 1"  (1.549 m)     Head Circumference --      Peak Flow --      Pain Score 09/01/18 1515 10     Pain Loc --      Pain Edu? --      Excl. in [place]? --     Constitutional: Alert and oriented. Well appearing and in no apparent distress. HEENT:      Head: Normocephalic and atraumatic.         Eyes: Conjunctivae are normal. Sclera is non-icteric.       Mouth/Throat: Mucous membranes are moist.       Neck: Supple with no signs of meningismus. Cardiovascular: Regular rate and rhythm. No murmurs, gallops, or rubs. 2+ symmetrical distal pulses are present in all extremities. No JVD. Respiratory: Normal respiratory effort. Lungs are clear to auscultation bilaterally. No wheezes, crackles, or rhonchi.  Gastrointestinal: Soft, ttp over the LLQ, and non distended with positive bowel sounds. No rebound or guarding. Genitourinary: No CVA tenderness.Rectal exam showing no stool in the rectal vault and small amount of left over blood, no active bleeding, no fissure or external hemorrhoids.  Musculoskeletal: Nontender with normal range of motion in all  extremities. No edema, cyanosis, or erythema of extremities. Neurologic: Normal speech and language. Face is symmetric. Moving all extremities. No gross focal neurologic deficits are appreciated. Skin: Skin is warm, dry and intact. No rash noted. Psychiatric: Mood and affect are normal. Speech and behavior are normal.  ____________________________________________   LABS (all labs ordered are listed, but only abnormal results are displayed)  Labs Reviewed  COMPREHENSIVE METABOLIC PANEL - Abnormal; Notable for the following components:      Result Value   Sodium 133 (*)    Glucose, Bld 132 (*)    All other components within normal limits  CBC - Abnormal; Notable for the following components:   WBC 19.5 (*)    RBC 5.58 (*)    Hemoglobin 15.5 (*)  HCT 46.2 (*)    All other components within normal limits  URINALYSIS, COMPLETE (UACMP) WITH MICROSCOPIC - Abnormal; Notable for the following components:   Color, Urine YELLOW (*)    APPearance CLEAR (*)    Bacteria, UA RARE (*)    All other components within normal limits  HEMOGLOBIN AND HEMATOCRIT, BLOOD - Abnormal; Notable for the following components:   Hemoglobin 15.3 (*)    HCT 46.1 (*)    All other components within normal limits  C DIFFICILE QUICK SCREEN W PCR REFLEX  LIPASE, BLOOD  POC URINE PREG, ED  TYPE AND SCREEN   ____________________________________________  EKG  none  ____________________________________________  RADIOLOGY  I have personally reviewed the images performed during this visit and I agree with the Radiologist's read.   Interpretation by Radiologist:  Ct Abdomen Pelvis W Contrast  Result Date: 09/01/2018 CLINICAL DATA:  Lower abdominal pain and vomiting starting yesterday. EXAM: CT ABDOMEN AND PELVIS WITH CONTRAST TECHNIQUE: Multidetector CT imaging of the abdomen and pelvis was performed using the standard protocol following bolus administration of intravenous contrast. CONTRAST:  180m  OMNIPAQUE IOHEXOL 300 MG/ML  SOLN COMPARISON:  None. FINDINGS: Lower chest: No acute abnormality. Hepatobiliary: No focal liver abnormality is seen. No gallstones, gallbladder wall thickening, or biliary dilatation. Pancreas: Unremarkable. No pancreatic ductal dilatation or surrounding inflammatory changes. Spleen: Normal in size without focal abnormality. Adrenals/Urinary Tract: Adrenal glands are unremarkable. Kidneys are normal, without renal calculi, focal lesion, or hydronephrosis. Bladder is unremarkable. Stomach/Bowel: Thickening of the walls of the LEFT colon and distal transverse colon, with subtle pericolic inflammation, consistent with acute colitis. No dilated large or small bowel loops. Stomach is unremarkable, partially decompressed. Vascular/Lymphatic: No significant vascular findings are present. No enlarged abdominal or pelvic lymph nodes. Reproductive: Uterus and bilateral adnexa are unremarkable. Other: No abscess collection. No free intraperitoneal air. Musculoskeletal: No acute or significant osseous findings. IMPRESSION: 1. Thickening of the walls of the LEFT colon and distal transverse colon, with subtle paracolic inflammation, consistent with acute colitis of infectious or inflammatory nature. 2. Remainder of the abdomen and pelvis CT is unremarkable, as detailed above. Electronically Signed   By: SFranki CabotM.D.   On: 09/01/2018 17:37      ____________________________________________   PROCEDURES  Procedure(s) performed: None Procedures Critical Care performed:  None ____________________________________________   INITIAL IMPRESSION / ASSESSMENT AND PLAN / ED COURSE  53y.o. female with h/o internal hemorrhoids, HLD, HTN who presents for evaluation of abdominal pain, diarrhea, and rectal bleeding.   Patient is hemodynamically stable, rectal exam showing no evidence of fissure, external hemorrhoids, no active bleeding.  Patient did have small amount of blood around the  anus.  Abdomen is soft with left lower quadrant tenderness, no rebound or guarding.  Labs showing stable hemoglobin of 15.5.  We will send stool for C. difficile, do a CT abdomen and pelvis to rule out diverticulitis or colitis which would require an antibiotic.  Patient prefers to go home then be admitted for observation therefore we will keep her here for 4 hours from initial CBC and if patient has no further episodes of rectal bleeding and CBC remains stable will discharge her home then.  Clinical Course as of Sep 02 2023  Mon Sep 01, 2018  2007 CT positive for colitis.  Repeat H&H in 4 hours is unchanged.  Patient had just one episode of rectal bleeding consistent with a teaspoon of blood.  She was given Zosyn for colitis.  She  remains hemodynamically stable.  C. difficile is pending   [CV]    Clinical Course User Index [CV] Alfred Levins [place], MD   _________________________ 8:25 PM on 09/01/2018 -----------------------------------------  C. Diff negative.  Patient remained hemodynamically stable with no further episodes of diarrhea.  Will discharge home on Augmentin, Flagyl, Zofran and close follow-up with primary care doctor.  Discussed return precautions.  As part of my medical decision making, I reviewed the following data within the [facility] notes reviewed and incorporated, Labs reviewed , Old chart reviewed, Radiograph reviewed , Notes from prior ED visits and [place] Controlled Substance Database    Pertinent labs & imaging results that were available during my care of the patient were reviewed by me and considered in my medical decision making (see chart for details).    ____________________________________________   FINAL CLINICAL IMPRESSION(S) / ED DIAGNOSES  Final diagnoses:  Colitis      NEW MEDICATIONS STARTED DURING THIS VISIT:  ED Discharge Orders         Ordered    amoxicillin-clavulanate (AUGMENTIN) 875-125 MG tablet  2 times daily      09/01/18 2023    metroNIDAZOLE (FLAGYL) 500 MG tablet  3 times daily     09/01/18 2023    ondansetron (ZOFRAN ODT) 4 MG disintegrating tablet  Every 8 hours PRN     09/01/18 2023           Note:  This document was prepared using Dragon voice recognition software and may include unintentional dictation errors.    Alfred Levins, [place], MD 09/01/18 2025

## 2018-09-01 NOTE — Telephone Encounter (Signed)
Called patient. She is already at ED.

## 2018-09-03 ENCOUNTER — Other Ambulatory Visit: Payer: Self-pay

## 2018-09-03 ENCOUNTER — Telehealth: Payer: Self-pay

## 2018-09-03 NOTE — Telephone Encounter (Signed)
Can she come in tomorrow. It has to be at lunch. No later than 12.:30

## 2018-09-03 NOTE — Telephone Encounter (Signed)
Copied from [place] [id number]. Topic: General - Inquiry >> Sep 02, 2018  1:01 PM Rayann Heman wrote: Reason for CRM: pt called and stated that she needs a hospital follow up within 2 days. Could she be worked in. If not she is requesting a call back from provider. Please advise >> Sep 02, 2018  1:38 PM Myatt, Marland Kitchen wrote: Pt is scheduled on 09/09/2018, but wants to come in earlier. Please advise/

## 2018-09-04 ENCOUNTER — Encounter: Payer: Self-pay | Admitting: Family Medicine

## 2018-09-04 ENCOUNTER — Ambulatory Visit: Payer: BC Managed Care – PPO | Admitting: Family Medicine

## 2018-09-04 VITALS — BP 136/74 | HR 94 | Temp 97.5°F | Resp 18 | Ht 61.0 in | Wt 196.7 lb

## 2018-09-04 DIAGNOSIS — K5100 Ulcerative (chronic) pancolitis without complications: Secondary | ICD-10-CM | POA: Diagnosis not present

## 2018-09-04 NOTE — Patient Instructions (Signed)
Colitis  Colitis is inflammation of the colon. Colitis may last a short time (be acute), or it may last a long time (become chronic). What are the causes? This condition may be caused by:  Viruses.  Bacteria.  Reaction to medicine.  Certain autoimmune diseases such as Crohn's disease or ulcerative colitis.  Radiation treatment.  Decreased blood flow to the bowel (ischemia). What are the signs or symptoms? Symptoms of this condition include:  Watery diarrhea.  Passing bloody or tarry stool.  Pain.  Fever.  Vomiting.  Tiredness (fatigue).  Weight loss.  Bloating.  Abdominal pain.  Having fewer bowel movements than usual.  A strong and sudden urge to have a bowel movement.  Feeling like the bowel is not empty after a bowel movement. How is this diagnosed? This condition is diagnosed with a stool test or a blood test. You may also have other tests, such as:  X-rays.  CT scan.  Colonoscopy.  Endoscopy.  Biopsy. How is this treated? Treatment for this condition depends on the cause. The condition may be treated by:  Resting the bowel. This involves not eating or drinking for a period of time.  Fluids that are given through an IV.  Medicine for pain and diarrhea.  Antibiotic medicines.  Cortisone medicines.  Surgery. Follow these instructions at home: Eating and drinking   Follow instructions from your health care provider about eating or drinking restrictions.  Drink enough fluid to keep your urine pale yellow.  Work with a dietitian to determine which foods cause your condition to flare up.  Avoid foods that cause flare-ups.  Eat a well-balanced diet. General instructions  If you were prescribed an antibiotic medicine, take it as told by your health care provider. Do not stop taking the antibiotic even if you start to feel better.  Take over-the-counter and prescription medicines only as told by your health care provider.  Keep all  follow-up visits as told by your health care provider. This is important. Contact a health care provider if:  Your symptoms do not go away.  You develop new symptoms. Get help right away if you:  Have a fever that does not go away with treatment.  Develop chills.  Have extreme weakness, fainting, or dehydration.  Have repeated vomiting.  Develop severe pain in your abdomen.  Pass bloody or tarry stool. Summary  Colitis is inflammation of the colon. Colitis may last a short time (be acute), or it may last a long time (become chronic).  Treatment for this condition depends on the cause and may include resting the bowel, taking medicines, or having surgery.  If you were prescribed an antibiotic medicine, take it as told by your health care provider. Do not stop taking the antibiotic even if you start to feel better.  Get help right away if you develop severe pain in your abdomen.  Keep all follow-up visits as told by your health care provider. This is important. This information is not intended to replace advice given to you by your health care provider. Make sure you discuss any questions you have with your health care provider. Document Released: 08/16/2004 Document Revised: 01/09/2018 Document Reviewed: 01/09/2018 Elsevier Interactive Patient Education  2019 [employer].

## 2018-09-04 NOTE — Telephone Encounter (Signed)
appt made

## 2018-09-04 NOTE — Progress Notes (Signed)
Name: Karen Donaldson   MRN: [id number]    DOB: 10/21/1965   Date:09/04/2018       Progress Note  Subjective  Chief Complaint  Chief Complaint  Patient presents with  . Hospitalization Follow-up    States Sunday night she started have lower abdomen pain, diarrhea then it changed to blood stool. A couple of hours later blood from her rectal.  . Rectal Bleeding    Was given Antibiotics and today is her first day without pain.    HPI  Colitis :she developed acute abdominal pain that lasted about 2 hours ( started around 2 am on Feb 10th) followed by loose stools, after about one hour stools mixed with blood and about 1.5 later pure blood in the toilette bowel. She had multiple episodes before she called the triage nurse and was sent to [facility]. Her aunt took her to [facility]. She had WBC of over 19 K, CT showed acute pancolitis either infections or inflammatory. She ate out the night before and thought it was food poisoning. She states no other sick contacts. She was sent home on Augmentin and Metronidazole and follow up with GI. She has follow up with GI on Feb 5th, 2020. Last colonoscopy was normal except for internal hemorrhoids in 2017. She still having loose stools but no longer with blood still multiple times a day about 5 yesterday, today 2 bowel movements. She slept through the night for the first time last night. No fever or chills. She has poor appetite and is not eating much. She has not been back to work yet. Feeling weak and tired. She had a follow up scheduled for next week, but called yesterday because she was worried about symptoms not improving. She is however feeling better today.   Patient Active Problem List   Diagnosis Date Noted  . CIN I (cervical intraepithelial neoplasia I) 02/22/2015  . Benign essential HTN 02/06/2015  . Dyslipidemia 02/06/2015  . Blood glucose elevated 02/06/2015  . Obesity (BMI 30-39.9) 02/06/2015  . Perennial allergic rhinitis with seasonal variation  02/06/2015  . Stress incontinence 02/06/2015  . Vitamin D deficiency 02/06/2015    Past Surgical History:  Procedure Laterality Date  . CESAREAN SECTION  2009, 2012  . COLONOSCOPY WITH PROPOFOL N/A 02/03/2016   Procedure: COLONOSCOPY WITH PROPOFOL;  Surgeon: Lucilla Lame, MD;  Location: [place];  Service: Endoscopy;  Laterality: N/A;  . TUBAL LIGATION      Family History  Problem Relation Age of Onset  . Hypertension Mother   . Diabetes Mother   . Dementia Mother   . Hypertension Father   . Breast cancer Maternal Aunt   . Ovarian cancer Neg Hx   . Colon cancer Neg Hx   . Heart disease Neg Hx     Social History   Socioeconomic History  . Marital status: Married    Spouse name: Elta Guadeloupe  . Number of children: 2  . Years of education: Not on file  . Highest education level: Master's degree (e.g., MA, MS, MEng, MEd, MSW, MBA)  Occupational History  . Not on file  Social Needs  . Financial resource strain: Not hard at all  . Food insecurity:    Worry: Never true    Inability: Never true  . Transportation needs:    Medical: No    Non-medical: No  Tobacco Use  . Smoking status: Never Smoker  . Smokeless tobacco: Never Used  Substance and Sexual Activity  . Alcohol use: No  Alcohol/week: 0.0 standard drinks  . Drug use: No  . Sexual activity: Yes    Partners: Male    Birth control/protection: Surgical  Lifestyle  . Physical activity:    Days per week: 4 days    Minutes per session: 30 min  . Stress: Not at all  Relationships  . Social connections:    Talks on phone: More than three times a week    Gets together: More than three times a week    Attends religious service: 1 to 4 times per year    Active member of club or organization: Yes    Attends meetings of clubs or organizations: 1 to 4 times per year    Relationship status: Married  . Intimate partner violence:    Fear of current or ex partner: No    Emotionally abused: No    Physically abused:  No    Forced sexual activity: No  Other Topics Concern  . Not on file  Social History Narrative  . Not on file     Current Outpatient Medications:  .  amoxicillin-clavulanate (AUGMENTIN) 875-125 MG tablet, Take 1 tablet by mouth 2 (two) times daily for 7 days., Disp: 20 tablet, Rfl: 0 .  Calcium Carbonate-Vitamin D 600-400 MG-UNIT per chew tablet, Chew 1 tablet by mouth daily., Disp: , Rfl:  .  diltiazem (CARDIZEM CD) 180 MG 24 hr capsule, Take 1 capsule (180 mg total) by mouth daily., Disp: 90 capsule, Rfl: 1 .  FIBER PO, Take by mouth daily as needed., Disp: , Rfl:  .  fluticasone (FLONASE) 50 MCG/ACT nasal spray, Place 2 sprays into both nostrils daily., Disp: 16 g, Rfl: 6 .  hydrochlorothiazide (MICROZIDE) 12.5 MG capsule, Take 1 capsule (12.5 mg total) by mouth every morning., Disp: 90 capsule, Rfl: 1 .  loratadine (CLARITIN) 10 MG tablet, Take 10 mg by mouth daily., Disp: , Rfl:  .  metroNIDAZOLE (FLAGYL) 500 MG tablet, Take 1 tablet (500 mg total) by mouth 3 (three) times daily for 7 days., Disp: 21 tablet, Rfl: 0 .  montelukast (SINGULAIR) 10 MG tablet, Take 1 tablet (10 mg total) by mouth daily., Disp: 90 tablet, Rfl: 1 .  MULTIPLE VITAMIN PO, Take 1 tablet by mouth daily., Disp: , Rfl:  .  ondansetron (ZOFRAN ODT) 4 MG disintegrating tablet, Take 1 tablet (4 mg total) by mouth every 8 (eight) hours as needed., Disp: 20 tablet, Rfl: 0  Allergies  Allergen Reactions  . Ace Inhibitors   . Sulfamethoxazole-Trimethoprim Rash    I personally reviewed active problem list, medication list, allergies, family history, social history with the patient/caregiver today.   ROS  Constitutional: Negative for fever or weight change.  Respiratory: Negative for cough and shortness of breath.   Cardiovascular: Negative for chest pain or palpitations.  Gastrointestinal: Positive  for abdominal pain and  bowel changes.  Musculoskeletal: Negative for gait problem or joint swelling.  Skin:  Negative for rash.  Neurological: Negative for dizziness or headache.  No other specific complaints in a complete review of systems (except as listed in HPI above).  Objective  Vitals:   09/04/18 1137  BP: 136/74  Pulse: 94  Resp: 18  Temp: (!) 97.5 F (36.4 C)  TempSrc: Oral  SpO2: 94%  Weight: 196 lb 11.2 oz (89.2 kg)  Height: 5' 1"  (1.549 m)    Body mass index is 37.17 kg/m.  Physical Exam  Constitutional: Patient appears well-developed and well-nourished. Obese  No distress.  HEENT:  head atraumatic, normocephalic, pupils equal and reactive to light,  neck supple, throat within normal limits Cardiovascular: Normal rate, regular rhythm and normal heart sounds.  No murmur heard. No BLE edema. Pulmonary/Chest: Effort normal and breath sounds normal. No respiratory distress. Abdominal: Soft.  There is no tenderness. Normal bowel sounds.  Psychiatric: Patient has a normal mood and affect. behavior is normal. Judgment and thought content normal.   Recent Results (from the past 2160 hour(s))  Lipase, blood     Status: None   Collection Time: 09/01/18  3:26 PM  Result Value Ref Range   Lipase 21 11 - 51 U/L    Comment: Performed at [facility], [address] [place], [place] [address]  Comprehensive metabolic panel     Status: Abnormal   Collection Time: 09/01/18  3:26 PM  Result Value Ref Range   Sodium 133 (L) 135 - 145 mmol/L   Potassium 3.9 3.5 - 5.1 mmol/L   Chloride 99 98 - 111 mmol/L   CO2 26 22 - 32 mmol/L   Glucose, Bld 132 (H) 70 - 99 mg/dL   BUN 15 6 - 20 mg/dL   Creatinine, Ser 0.75 0.44 - 1.00 mg/dL   Calcium 9.7 8.9 - 10.3 mg/dL   Total Protein 7.8 6.5 - 8.1 g/dL   Albumin 4.2 3.5 - 5.0 g/dL   AST 22 15 - 41 U/L   ALT 19 0 - 44 U/L   Alkaline Phosphatase 85 38 - 126 U/L   Total Bilirubin 0.8 0.3 - 1.2 mg/dL   GFR calc non Af Amer >60 >60 mL/min   GFR calc Af Amer >60 >60 mL/min   Anion gap 8 5 - 15    Comment: Performed at [facility], [address] [place], [place] [address]  CBC     Status: Abnormal   Collection Time: 09/01/18  3:26 PM  Result Value Ref Range   WBC 19.5 (H) 4.0 - 10.5 K/uL   RBC 5.58 (H) 3.87 - 5.11 MIL/uL   Hemoglobin 15.5 (H) 12.0 - 15.0 g/dL   HCT 46.2 (H) 36.0 - 46.0 %   MCV 82.8 80.0 - 100.0 fL   MCH 27.8 26.0 - 34.0 pg   MCHC 33.5 30.0 - 36.0 g/dL   RDW 12.9 11.5 - 15.5 %   Platelets 312 150 - 400 K/uL   nRBC 0.0 0.0 - 0.2 %    Comment: Performed at [facility], [address] [place], [place] [address]  Urinalysis, Complete w Microscopic     Status: Abnormal   Collection Time: 09/01/18  3:26 PM  Result Value Ref Range   Color, Urine YELLOW (A) YELLOW   APPearance CLEAR (A) CLEAR   Specific Gravity, Urine 1.028 1.005 - 1.030   pH 6.0 5.0 - 8.0   Glucose, UA NEGATIVE NEGATIVE mg/dL   Hgb urine dipstick NEGATIVE NEGATIVE   Bilirubin Urine NEGATIVE NEGATIVE   Ketones, ur NEGATIVE NEGATIVE mg/dL   Protein, ur NEGATIVE NEGATIVE mg/dL   Nitrite NEGATIVE NEGATIVE   Leukocytes, UA NEGATIVE NEGATIVE   RBC / HPF 0-5 0 - 5 RBC/hpf   WBC, UA 0-5 0 - 5 WBC/hpf   Bacteria, UA RARE (A) NONE SEEN   Squamous Epithelial / LPF 0-5 0 - 5   Mucus PRESENT     Comment: Performed at [facility], [address] [place], [place] [address]  Type and screen [place]     Status:  None   Collection Time: 09/01/18  3:26 PM  Result Value Ref Range   ABO/RH(D) O POS    Antibody Screen NEG    Sample Expiration      09/04/2018 Performed at [facility], [address] [place], [place] [address]   C difficile quick scan w PCR reflex     Status: None   Collection Time: 09/01/18  7:26 PM  Result Value Ref Range   C Diff antigen NEGATIVE NEGATIVE   C Diff toxin NEGATIVE NEGATIVE   C Diff interpretation No C. difficile detected.     Comment: Performed at [facility], [address] [place], [place] [address]  Hemoglobin and  hematocrit, blood     Status: Abnormal   Collection Time: 09/01/18  7:46 PM  Result Value Ref Range   Hemoglobin 15.3 (H) 12.0 - 15.0 g/dL   HCT 46.1 (H) 36.0 - 46.0 %    Comment: Performed at [facility], [address] [place], [place] [address]     PHQ2/9: Depression screen [facility] 2/9 08/15/2018 02/12/2018 08/14/2017 08/13/2016 02/08/2016  Decreased Interest 0 0 0 0 0  Down, Depressed, Hopeless 0 0 0 0 0  PHQ - 2 Score 0 0 0 0 0  Altered sleeping - 0 - - -  Tired, decreased energy - 0 - - -  Change in appetite - 0 - - -  Feeling bad or failure about yourself  - 0 - - -  Trouble concentrating - 0 - - -  Suicidal thoughts - 0 - - -  PHQ-9 Score - 0 - - -    Fall Risk: Fall Risk  08/15/2018 05/30/2018 02/12/2018 08/14/2017 08/13/2016  Falls in the past year? 0 0 No No No  Number falls in past yr: 0 - - - -  Injury with Fall? 0 - - - -    Assessment & Plan  1. Pancolitis ([place])  - CBC with Differential/Platelet - COMPLETE METABOLIC PANEL WITH GFR  Continue antibiotics and keep follow up with GI, eat bland diet until feeling better

## 2018-09-05 LAB — CBC WITH DIFFERENTIAL/PLATELET
Absolute Monocytes: 559 cells/uL (ref 200–950)
Basophils Absolute: 52 cells/uL (ref 0–200)
Basophils Relative: 0.6 %
Eosinophils Absolute: 138 cells/uL (ref 15–500)
Eosinophils Relative: 1.6 %
HEMATOCRIT: 45.4 % — AB (ref 35.0–45.0)
Hemoglobin: 15.3 g/dL (ref 11.7–15.5)
Lymphs Abs: 2451 cells/uL (ref 850–3900)
MCH: 27.7 pg (ref 27.0–33.0)
MCHC: 33.7 g/dL (ref 32.0–36.0)
MCV: 82.2 fL (ref 80.0–100.0)
MPV: 12.2 fL (ref 7.5–12.5)
Monocytes Relative: 6.5 %
Neutro Abs: 5401 cells/uL (ref 1500–7800)
Neutrophils Relative %: 62.8 %
Platelets: 280 10*3/uL (ref 140–400)
RBC: 5.52 10*6/uL — ABNORMAL HIGH (ref 3.80–5.10)
RDW: 13.2 % (ref 11.0–15.0)
Total Lymphocyte: 28.5 %
WBC: 8.6 10*3/uL (ref 3.8–10.8)

## 2018-09-05 LAB — COMPLETE METABOLIC PANEL WITH GFR
AG Ratio: 1.4 (calc) (ref 1.0–2.5)
ALBUMIN MSPROF: 3.8 g/dL (ref 3.6–5.1)
ALT: 13 U/L (ref 6–29)
AST: 15 U/L (ref 10–35)
Alkaline phosphatase (APISO): 83 U/L (ref 37–153)
BUN: 11 mg/dL (ref 7–25)
CO2: 24 mmol/L (ref 20–32)
Calcium: 9.1 mg/dL (ref 8.6–10.4)
Chloride: 103 mmol/L (ref 98–110)
Creat: 0.94 mg/dL (ref 0.50–1.05)
GFR, Est African American: 81 mL/min/{1.73_m2} (ref >=60)
GFR, Est Non African American: 70 mL/min/{1.73_m2} (ref >=60)
GLUCOSE: 92 mg/dL (ref 65–99)
Globulin: 2.7 g/dL (calc) (ref 1.9–3.7)
Potassium: 4 mmol/L (ref 3.5–5.3)
Sodium: 139 mmol/L (ref 135–146)
Total Bilirubin: 0.3 mg/dL (ref 0.2–1.2)
Total Protein: 6.5 g/dL (ref 6.1–8.1)

## 2018-09-09 ENCOUNTER — Inpatient Hospital Stay: Payer: BC Managed Care – PPO | Admitting: Family Medicine

## 2018-09-25 ENCOUNTER — Encounter (INDEPENDENT_AMBULATORY_CARE_PROVIDER_SITE_OTHER): Payer: Self-pay

## 2018-09-25 ENCOUNTER — Encounter: Payer: Self-pay | Admitting: Gastroenterology

## 2018-09-25 ENCOUNTER — Ambulatory Visit: Payer: BC Managed Care – PPO | Admitting: Gastroenterology

## 2018-09-25 ENCOUNTER — Other Ambulatory Visit: Payer: Self-pay

## 2018-09-25 VITALS — BP 125/84 | HR 87 | Ht 61.0 in | Wt 200.0 lb

## 2018-09-25 DIAGNOSIS — K559 Vascular disorder of intestine, unspecified: Secondary | ICD-10-CM | POA: Diagnosis not present

## 2018-09-25 NOTE — Progress Notes (Signed)
Primary Care Physician: Steele Sizer, MD  Primary Gastroenterologist:  Dr. Lucilla Lame  No chief complaint on file.   HPI: Karen Donaldson is a 53 y.o. female here for a CT scan showing acute colitis. The patient had a normal screening colonoscopy by me back in July 2017 and recommended to have a repeat colonoscopy in 10 years.  The patient recently had a CT scan showing acute inflammatory changes in the descending and transverse colon. The impression is as follows:  IMPRESSION: 1. Thickening of the walls of the LEFT colon and distal transverse colon, with subtle paracolic inflammation, consistent with acute colitis of infectious or inflammatory nature. 2. Remainder of the abdomen and pelvis CT is unremarkable, as detailed above.  The patient had presented to the emergency room with bloody bowel movements and 5 out of 10 abdominal pain.The symptoms first started is just diarrhea and then progressed to bloody bowel movements.During this time the patient had one episode of vomiting after being nauseated. The patient had no sign of anemia or worrisome blood work done in the ER. The patient was treated with antibiotics and sent home with antibiotics.  The patient now reports that her abdominal pain has completely resolved and she is not having any abdominal discomfort or rectal bleeding.  She also denies any further diarrhea.  Current Outpatient Medications  Medication Sig Dispense Refill  . Calcium Carbonate-Vitamin D 600-400 MG-UNIT per chew tablet Chew 1 tablet by mouth daily.    Marland Kitchen diltiazem (CARDIZEM CD) 180 MG 24 hr capsule Take 1 capsule (180 mg total) by mouth daily. 90 capsule 1  . FIBER PO Take by mouth daily as needed.    . fluticasone (FLONASE) 50 MCG/ACT nasal spray Place 2 sprays into both nostrils daily. 16 g 6  . hydrochlorothiazide (MICROZIDE) 12.5 MG capsule Take 1 capsule (12.5 mg total) by mouth every morning. 90 capsule 1  . loratadine (CLARITIN) 10 MG tablet  Take 10 mg by mouth daily.    . montelukast (SINGULAIR) 10 MG tablet Take 1 tablet (10 mg total) by mouth daily. 90 tablet 1  . MULTIPLE VITAMIN PO Take 1 tablet by mouth daily.    . ondansetron (ZOFRAN ODT) 4 MG disintegrating tablet Take 1 tablet (4 mg total) by mouth every 8 (eight) hours as needed. 20 tablet 0   No current facility-administered medications for this visit.     Allergies as of 09/25/2018 - Review Complete 09/04/2018  Allergen Reaction Noted  . Ace inhibitors  02/06/2015  . Sulfamethoxazole-trimethoprim Rash 02/06/2015    ROS:  General: Negative for anorexia, weight loss, fever, chills, fatigue, weakness. ENT: Negative for hoarseness, difficulty swallowing , nasal congestion. CV: Negative for chest pain, angina, palpitations, dyspnea on exertion, peripheral edema.  Respiratory: Negative for dyspnea at rest, dyspnea on exertion, cough, sputum, wheezing.  GI: See history of present illness. GU:  Negative for dysuria, hematuria, urinary incontinence, urinary frequency, nocturnal urination.  Endo: Negative for unusual weight change.    Physical Examination:   There were no vitals taken for this visit.  General: Well-nourished, well-developed in no acute distress.  Eyes: No icterus. Conjunctivae pink. Mouth: Oropharyngeal mucosa moist and pink , no lesions erythema or exudate. Lungs: Clear to auscultation bilaterally. Non-labored. Heart: Regular rate and rhythm, no murmurs rubs or gallops.  Abdomen: Bowel sounds are normal, nontender, nondistended, no hepatosplenomegaly or masses, no abdominal bruits or hernia , no rebound or guarding.   Extremities: No lower extremity edema. No clubbing  or deformities. Neuro: Alert and oriented x 3.  Grossly intact. Skin: Warm and dry, no jaundice.   Psych: Alert and cooperative, normal mood and affect.  Labs:    Imaging Studies: Ct Abdomen Pelvis W Contrast  Result Date: 09/01/2018 CLINICAL DATA:  Lower abdominal pain and  vomiting starting yesterday. EXAM: CT ABDOMEN AND PELVIS WITH CONTRAST TECHNIQUE: Multidetector CT imaging of the abdomen and pelvis was performed using the standard protocol following bolus administration of intravenous contrast. CONTRAST:  197m OMNIPAQUE IOHEXOL 300 MG/ML  SOLN COMPARISON:  None. FINDINGS: Lower chest: No acute abnormality. Hepatobiliary: No focal liver abnormality is seen. No gallstones, gallbladder wall thickening, or biliary dilatation. Pancreas: Unremarkable. No pancreatic ductal dilatation or surrounding inflammatory changes. Spleen: Normal in size without focal abnormality. Adrenals/Urinary Tract: Adrenal glands are unremarkable. Kidneys are normal, without renal calculi, focal lesion, or hydronephrosis. Bladder is unremarkable. Stomach/Bowel: Thickening of the walls of the LEFT colon and distal transverse colon, with subtle pericolic inflammation, consistent with acute colitis. No dilated large or small bowel loops. Stomach is unremarkable, partially decompressed. Vascular/Lymphatic: No significant vascular findings are present. No enlarged abdominal or pelvic lymph nodes. Reproductive: Uterus and bilateral adnexa are unremarkable. Other: No abscess collection. No free intraperitoneal air. Musculoskeletal: No acute or significant osseous findings. IMPRESSION: 1. Thickening of the walls of the LEFT colon and distal transverse colon, with subtle paracolic inflammation, consistent with acute colitis of infectious or inflammatory nature. 2. Remainder of the abdomen and pelvis CT is unremarkable, as detailed above. Electronically Signed   By: SFranki CabotM.D.   On: 09/01/2018 17:37    Assessment and Plan:   MAmariah Kiersteadis a 53y.o. y/o female who was in the ER with abdominal discomfort rectal bleeding diarrhea and a CT scan showing left-sided colitis. The patient's symptoms and course is most consistent with this patient having ischemic event with ischemic colitis causing her  abdominal cramps rectal bleeding and diarrhea.  As the blood supply findings collaterals the symptoms rapidly improved with stay had on this patient and she is now completely asymptomatic.  The patient has been told that this was likely ischemic colitis and that she should follow the same procedure if she should start having abdominal pain bleeding and diarrhea future.  The patient is not in need of any further investigation at this time.  The patient has been explained the plan and agrees with it.    DLucilla Lame MD. FMarval Regal  Note: This dictation was prepared with Dragon dictation along with smaller phrase technology. Any transcriptional errors that result from this process are unintentional.

## 2019-02-05 ENCOUNTER — Other Ambulatory Visit: Payer: Self-pay | Admitting: Family Medicine

## 2019-02-05 DIAGNOSIS — J302 Other seasonal allergic rhinitis: Secondary | ICD-10-CM

## 2019-02-13 ENCOUNTER — Other Ambulatory Visit: Payer: Self-pay

## 2019-02-13 ENCOUNTER — Ambulatory Visit: Payer: BC Managed Care – PPO | Admitting: Family Medicine

## 2019-02-13 ENCOUNTER — Encounter: Payer: Self-pay | Admitting: Family Medicine

## 2019-02-13 VITALS — BP 116/68 | HR 87 | Temp 96.8°F | Resp 16 | Ht 61.0 in | Wt 200.3 lb

## 2019-02-13 DIAGNOSIS — E559 Vitamin D deficiency, unspecified: Secondary | ICD-10-CM | POA: Diagnosis not present

## 2019-02-13 DIAGNOSIS — R7303 Prediabetes: Secondary | ICD-10-CM

## 2019-02-13 DIAGNOSIS — K5150 Left sided colitis without complications: Secondary | ICD-10-CM

## 2019-02-13 DIAGNOSIS — I10 Essential (primary) hypertension: Secondary | ICD-10-CM | POA: Diagnosis not present

## 2019-02-13 DIAGNOSIS — E785 Hyperlipidemia, unspecified: Secondary | ICD-10-CM | POA: Diagnosis not present

## 2019-02-13 DIAGNOSIS — E669 Obesity, unspecified: Secondary | ICD-10-CM

## 2019-02-13 MED ORDER — HYDROCHLOROTHIAZIDE 12.5 MG PO CAPS: 12.5000 mg | ORAL_CAPSULE | Freq: Every morning | ORAL | 1 refills | Status: DC

## 2019-02-13 MED ORDER — DILTIAZEM HCL ER COATED BEADS 180 MG PO CP24: 180.0000 mg | ORAL_CAPSULE | Freq: Every day | ORAL | 1 refills | Status: DC

## 2019-02-13 NOTE — Progress Notes (Signed)
Name: Karen Donaldson   MRN: [id number]    DOB: 19-Nov-1965   Date:02/13/2019       Progress Note  Subjective  Chief Complaint  Chief Complaint  Patient presents with  . Hypertension    Denies any symptoms  . Medication Refill  . Allergic Rhinitis     Has been well controlled-will have a slight cough every now and then  . Hyperlipidemia  . Obesity    Has increased excerising to daily daily    HPI   HTN: patient states she has been compliant with her medication, no chest pain, no palpitation, noedema.BP at home is within normal limits, bp today   History of colitis: seen at [facility], had CT abdomen 08/2018 and showed left side colitis, per Dr. Durwin Reges likely ischemic colitis  Chronic constipation: BM's have been regular - she has a bowel movement every 2 days Symptoms beganhas been since childhood and no longer has to strain. Controlled with fiber supplements, dietary fiber and exercise. She has hemorrhoids, but no recent bleeding, colonoscopy is up to date - done in 2017 , unchanged   HistoryCIN I with HPV: seesDr. Leeanne Rio colposcopyin 03/2015, repeat pap smear was normal 02/2017, but positive HPV, she had endocervical curettage and was negative, she went back 02/2018 and had same findings, and biopsy also negative, she will go back in one year for repeat pap smear. Reminded her of her appointment today , she states already scheduled   AR: she has intermittent symptoms, she has episodes of nasal congestion ( present now) , and a dry cough,usually triggered by weather changes.She takes singulair daily and flonase prn, currently on Claritin but she will switch back to zyrtec  Hyperlipidemia: she is not taking medication. Explained since ischemic colitis she may re-consider, but she wants to hold off for now  The 10-year ASCVD risk score Mikey Bussing DC Brooke Bonito., et al., 2013) is: 3%   Values used to calculate the score:     Age: 84 years     Sex: Female     Is Non-Hispanic  African American: Yes     Diabetic: No     Tobacco smoker: No     Systolic Blood Pressure: 056 mmHg     Is BP treated: Yes     HDL Cholesterol: 56 mg/dL     Total Cholesterol: 207 mg/dL  Pre-diabetes: she denies polyphagia, polyuria or polydipsia. Last hgbA1C 5.7% one year ago, she has family history of DM ( Mother ).   Obesity:she has lost 3 lbs since the Summer, she is not eating after 7 pm, she eats at least 3 servings of fruit and vegetables per day.   Patient Active Problem List   Diagnosis Date Noted  . CIN I (cervical intraepithelial neoplasia I) 02/22/2015  . Benign essential HTN 02/06/2015  . Dyslipidemia 02/06/2015  . Blood glucose elevated 02/06/2015  . Obesity (BMI 30-39.9) 02/06/2015  . Perennial allergic rhinitis with seasonal variation 02/06/2015  . Stress incontinence 02/06/2015  . Vitamin D deficiency 02/06/2015    Past Surgical History:  Procedure Laterality Date  . CESAREAN SECTION  2009, 2012  . COLONOSCOPY WITH PROPOFOL N/A 02/03/2016   Procedure: COLONOSCOPY WITH PROPOFOL;  Surgeon: Lucilla Lame, MD;  Location: [place];  Service: Endoscopy;  Laterality: N/A;  . TUBAL LIGATION      Family History  Problem Relation Age of Onset  . Hypertension Mother   . Diabetes Mother   . Dementia Mother   . Hypertension Father   .  Breast cancer Maternal Aunt   . Ovarian cancer Neg Hx   . Colon cancer Neg Hx   . Heart disease Neg Hx     Social History   Socioeconomic History  . Marital status: Married    Spouse name: Elta Guadeloupe  . Number of children: 2  . Years of education: Not on file  . Highest education level: Master's degree (e.g., MA, MS, MEng, MEd, MSW, MBA)  Occupational History  . Not on file  Social Needs  . Financial resource strain: Not hard at all  . Food insecurity    Worry: Never true    Inability: Never true  . Transportation needs    Medical: No    Non-medical: No  Tobacco Use  . Smoking status: Never Smoker  . Smokeless  tobacco: Never Used  Substance and Sexual Activity  . Alcohol use: No    Alcohol/week: 0.0 standard drinks  . Drug use: No  . Sexual activity: Yes    Partners: Male    Birth control/protection: Surgical  Lifestyle  . Physical activity    Days per week: 4 days    Minutes per session: 30 min  . Stress: Not at all  Relationships  . Social connections    Talks on phone: More than three times a week    Gets together: More than three times a week    Attends religious service: 1 to 4 times per year    Active member of club or organization: Yes    Attends meetings of clubs or organizations: 1 to 4 times per year    Relationship status: Married  . Intimate partner violence    Fear of current or ex partner: No    Emotionally abused: No    Physically abused: No    Forced sexual activity: No  Other Topics Concern  . Not on file  Social History Narrative  . Not on file     Current Outpatient Medications:  .  Calcium Carbonate-Vitamin D 600-400 MG-UNIT per chew tablet, Chew 1 tablet by mouth daily., Disp: , Rfl:  .  diltiazem (CARDIZEM CD) 180 MG 24 hr capsule, Take 1 capsule (180 mg total) by mouth daily., Disp: 90 capsule, Rfl: 1 .  FIBER PO, Take by mouth daily as needed., Disp: , Rfl:  .  fluticasone (FLONASE) 50 MCG/ACT nasal spray, Place 2 sprays into both nostrils daily., Disp: 16 g, Rfl: 6 .  hydrochlorothiazide (MICROZIDE) 12.5 MG capsule, Take 1 capsule (12.5 mg total) by mouth every morning., Disp: 90 capsule, Rfl: 1 .  loratadine (CLARITIN) 10 MG tablet, Take 10 mg by mouth daily., Disp: , Rfl:  .  montelukast (SINGULAIR) 10 MG tablet, TAKE 1 TABLET BY MOUTH EVERY DAY, Disp: 90 tablet, Rfl: 1 .  MULTIPLE VITAMIN PO, Take 1 tablet by mouth daily., Disp: , Rfl:   Allergies  Allergen Reactions  . Ace Inhibitors   . Sulfamethoxazole-Trimethoprim Rash    I personally reviewed active problem list, medication list, allergies, family history, social history with the  patient/caregiver today.   ROS  Constitutional: Negative for fever or weight change.  Respiratory: Negative for cough and shortness of breath.   Cardiovascular: Negative for chest pain or palpitations.  Gastrointestinal: Negative for abdominal pain, no bowel changes.  Musculoskeletal: Negative for gait problem or joint swelling.  Skin: Negative for rash.  Neurological: Negative for dizziness or headache.  No other specific complaints in a complete review of systems (except as listed in HPI above).  Objective  Vitals:   02/13/19 0855  BP: 116/68  Pulse: 87  Resp: 16  Temp: (!) 96.8 F (36 C)  TempSrc: Temporal  SpO2: 99%  Weight: 200 lb 4.8 oz (90.9 kg)  Height: 5' 1"  (1.549 m)    Body mass index is 37.85 kg/m.  Physical Exam  Constitutional: Patient appears well-developed and well-nourished. Obese No distress.  HEENT: head atraumatic, normocephalic, pupils equal and reactive to light, neck supple Cardiovascular: Normal rate, regular rhythm and normal heart sounds.  No murmur heard. No BLE edema. Pulmonary/Chest: Effort normal and breath sounds normal. No respiratory distress. Abdominal: Soft.  There is no tenderness. Psychiatric: Patient has a normal mood and affect. behavior is normal. Judgment and thought content normal.  PHQ2/9: Depression screen [facility] 2/9 02/13/2019 08/15/2018 02/12/2018 08/14/2017 08/13/2016  Decreased Interest 0 0 0 0 0  Down, Depressed, Hopeless 0 0 0 0 0  PHQ - 2 Score 0 0 0 0 0  Altered sleeping 0 - 0 - -  Tired, decreased energy 0 - 0 - -  Change in appetite 0 - 0 - -  Feeling bad or failure about yourself  0 - 0 - -  Trouble concentrating 0 - 0 - -  Moving slowly or fidgety/restless 0 - - - -  Suicidal thoughts 0 - 0 - -  PHQ-9 Score 0 - 0 - -  Difficult doing work/chores Not difficult at all - - - -    phq 9 is negative   Fall Risk: Fall Risk  02/13/2019 08/15/2018 05/30/2018 02/12/2018 08/14/2017  Falls in the past year? 0 0 0 No No   Number falls in past yr: 0 0 - - -  Injury with Fall? 0 0 - - -     Assessment & Plan  1. Benign essential HTN  - COMPLETE METABOLIC PANEL WITH GFR - diltiazem (CARDIZEM CD) 180 MG 24 hr capsule; Take 1 capsule (180 mg total) by mouth daily.  Dispense: 90 capsule; Refill: 1 - hydrochlorothiazide (MICROZIDE) 12.5 MG capsule; Take 1 capsule (12.5 mg total) by mouth every morning.  Dispense: 90 capsule; Refill: 1  2. Left sided colitis without complications (HCC)  - CBC with Differential/Platelet - C-reactive protein - Sedimentation rate  3. Dyslipidemia  - Lipid panel  4. Vitamin D deficiency  - VITAMIN D 25 Hydroxy (Vit-D Deficiency, Fractures)  5. Obesity (BMI 30-39.9)  Discussed with the patient the risk posed by an increased BMI. Discussed importance of portion control, calorie counting and at least 150 minutes of physical activity weekly. Avoid sweet beverages and drink more water. Eat at least 6 servings of fruit and vegetables daily   6. Pre-diabetes  - Hemoglobin A1c

## 2019-02-14 LAB — COMPLETE METABOLIC PANEL WITH GFR
AG Ratio: 1.6 (calc) (ref 1.0–2.5)
ALT: 17 U/L (ref 6–29)
AST: 21 U/L (ref 10–35)
Albumin: 4.2 g/dL (ref 3.6–5.1)
Alkaline phosphatase (APISO): 71 U/L (ref 37–153)
BUN: 20 mg/dL (ref 7–25)
CO2: 19 mmol/L — ABNORMAL LOW (ref 20–32)
Calcium: 9.8 mg/dL (ref 8.6–10.4)
Chloride: 105 mmol/L (ref 98–110)
Creat: 1.05 mg/dL (ref 0.50–1.05)
GFR, Est African American: 70 mL/min/{1.73_m2} (ref >=60)
GFR, Est Non African American: 61 mL/min/{1.73_m2} (ref >=60)
Globulin: 2.7 g/dL (calc) (ref 1.9–3.7)
Glucose, Bld: 93 mg/dL (ref 65–99)
Potassium: 4.6 mmol/L (ref 3.5–5.3)
Sodium: 137 mmol/L (ref 135–146)
Total Bilirubin: 0.6 mg/dL (ref 0.2–1.2)
Total Protein: 6.9 g/dL (ref 6.1–8.1)

## 2019-02-14 LAB — CBC WITH DIFFERENTIAL/PLATELET
Absolute Monocytes: 330 cells/uL (ref 200–950)
Basophils Absolute: 31 cells/uL (ref 0–200)
Basophils Relative: 0.7 %
Eosinophils Absolute: 70 cells/uL (ref 15–500)
Eosinophils Relative: 1.6 %
HCT: 47.6 % — ABNORMAL HIGH (ref 35.0–45.0)
Hemoglobin: 15.8 g/dL — ABNORMAL HIGH (ref 11.7–15.5)
Lymphs Abs: 1690 cells/uL (ref 850–3900)
MCH: 28.1 pg (ref 27.0–33.0)
MCHC: 33.2 g/dL (ref 32.0–36.0)
MCV: 84.7 fL (ref 80.0–100.0)
MPV: 11.7 fL (ref 7.5–12.5)
Monocytes Relative: 7.5 %
Neutro Abs: 2279 cells/uL (ref 1500–7800)
Neutrophils Relative %: 51.8 %
Platelets: 225 10*3/uL (ref 140–400)
RBC: 5.62 10*6/uL — ABNORMAL HIGH (ref 3.80–5.10)
RDW: 13.6 % (ref 11.0–15.0)
Total Lymphocyte: 38.4 %
WBC: 4.4 10*3/uL (ref 3.8–10.8)

## 2019-02-14 LAB — LIPID PANEL
Cholesterol: 231 mg/dL — ABNORMAL HIGH (ref <200)
HDL: 52 mg/dL (ref >=50)
LDL Cholesterol (Calc): 155 mg/dL (calc) — ABNORMAL HIGH
Non-HDL Cholesterol (Calc): 179 mg/dL (calc) — ABNORMAL HIGH (ref <130)
Total CHOL/HDL Ratio: 4.4 (calc) (ref <5.0)
Triglycerides: 120 mg/dL (ref <150)

## 2019-02-14 LAB — SEDIMENTATION RATE

## 2019-02-14 LAB — HEMOGLOBIN A1C
Hgb A1c MFr Bld: 5.7 % of total Hgb — ABNORMAL HIGH (ref <5.7)
Mean Plasma Glucose: 117 (calc)
eAG (mmol/L): 6.5 (calc)

## 2019-02-14 LAB — VITAMIN D 25 HYDROXY (VIT D DEFICIENCY, FRACTURES): Vit D, 25-Hydroxy: 30 ng/mL (ref 30–100)

## 2019-02-14 LAB — C-REACTIVE PROTEIN: CRP: 4.8 mg/L (ref <8.0)

## 2019-03-10 ENCOUNTER — Encounter: Payer: Self-pay | Admitting: Obstetrics and Gynecology

## 2019-03-10 ENCOUNTER — Other Ambulatory Visit (HOSPITAL_COMMUNITY)
Admission: RE | Admit: 2019-03-10 | Discharge: 2019-03-10 | Disposition: A | Discharge status: Home / Self Care | Payer: BC Managed Care – PPO | Source: Ambulatory Visit | Attending: Obstetrics and Gynecology | Admitting: Obstetrics and Gynecology

## 2019-03-10 ENCOUNTER — Ambulatory Visit (INDEPENDENT_AMBULATORY_CARE_PROVIDER_SITE_OTHER): Payer: BC Managed Care – PPO | Admitting: Obstetrics and Gynecology

## 2019-03-10 ENCOUNTER — Other Ambulatory Visit: Payer: Self-pay

## 2019-03-10 VITALS — BP 144/94 | HR 78 | Ht 61.0 in | Wt 199.6 lb

## 2019-03-10 DIAGNOSIS — E668 Other obesity: Secondary | ICD-10-CM | POA: Diagnosis not present

## 2019-03-10 DIAGNOSIS — Z01419 Encounter for gynecological examination (general) (routine) without abnormal findings: Secondary | ICD-10-CM | POA: Diagnosis not present

## 2019-03-10 DIAGNOSIS — Z1231 Encounter for screening mammogram for malignant neoplasm of breast: Secondary | ICD-10-CM | POA: Diagnosis not present

## 2019-03-10 DIAGNOSIS — N951 Menopausal and female climacteric states: Secondary | ICD-10-CM

## 2019-03-10 DIAGNOSIS — Z8619 Personal history of other infectious and parasitic diseases: Secondary | ICD-10-CM | POA: Diagnosis not present

## 2019-03-10 DIAGNOSIS — E669 Obesity, unspecified: Secondary | ICD-10-CM

## 2019-03-10 DIAGNOSIS — R7303 Prediabetes: Secondary | ICD-10-CM

## 2019-03-10 DIAGNOSIS — E7800 Pure hypercholesterolemia, unspecified: Secondary | ICD-10-CM

## 2019-03-10 DIAGNOSIS — I10 Essential (primary) hypertension: Secondary | ICD-10-CM

## 2019-03-10 NOTE — Patient Instructions (Signed)
Health Maintenance, Female Adopting a healthy lifestyle and getting preventive care are important in promoting health and wellness. Ask your health care provider about:  The right schedule for you to have regular tests and exams.  Things you can do on your own to prevent diseases and keep yourself healthy. What should I know about diet, weight, and exercise? Eat a healthy diet   Eat a diet that includes plenty of vegetables, fruits, low-fat dairy products, and lean protein.  Do not eat a lot of foods that are high in solid fats, added sugars, or sodium. Maintain a healthy weight Body mass index (BMI) is used to identify weight problems. It estimates body fat based on height and weight. Your health care provider can help determine your BMI and help you achieve or maintain a healthy weight. Get regular exercise Get regular exercise. This is one of the most important things you can do for your health. Most adults should:  Exercise for at least 150 minutes each week. The exercise should increase your heart rate and make you sweat (moderate-intensity exercise).  Do strengthening exercises at least twice a week. This is in addition to the moderate-intensity exercise.  Spend less time sitting. Even light physical activity can be beneficial. Watch cholesterol and blood lipids Have your blood tested for lipids and cholesterol at 53 years of age, then have this test every 5 years. Have your cholesterol levels checked more often if:  Your lipid or cholesterol levels are high.  You are older than 53 years of age.  You are at high risk for heart disease. What should I know about cancer screening? Depending on your health history and family history, you may need to have cancer screening at various ages. This may include screening for:  Breast cancer.  Cervical cancer.  Colorectal cancer.  Skin cancer.  Lung cancer. What should I know about heart disease, diabetes, and high blood  pressure? Blood pressure and heart disease  High blood pressure causes heart disease and increases the risk of stroke. This is more likely to develop in people who have high blood pressure readings, are of African descent, or are overweight.  Have your blood pressure checked: ? Every 3-5 years if you are 18-39 years of age. ? Every year if you are 40 years old or older. Diabetes Have regular diabetes screenings. This checks your fasting blood sugar level. Have the screening done:  Once every three years after age 40 if you are at a normal weight and have a low risk for diabetes.  More often and at a younger age if you are overweight or have a high risk for diabetes. What should I know about preventing infection? Hepatitis B If you have a higher risk for hepatitis B, you should be screened for this virus. Talk with your health care provider to find out if you are at risk for hepatitis B infection. Hepatitis C Testing is recommended for:  Everyone born from 1945 through 1965.  Anyone with known risk factors for hepatitis C. Sexually transmitted infections (STIs)  Get screened for STIs, including gonorrhea and chlamydia, if: ? You are sexually active and are younger than 53 years of age. ? You are older than 53 years of age and your health care provider tells you that you are at risk for this type of infection. ? Your sexual activity has changed since you were last screened, and you are at increased risk for chlamydia or gonorrhea. Ask your health care provider if   you are at risk.  Ask your health care provider about whether you are at high risk for HIV. Your health care provider may recommend a prescription medicine to help prevent HIV infection. If you choose to take medicine to prevent HIV, you should first get tested for HIV. You should then be tested every 3 months for as long as you are taking the medicine. Pregnancy  If you are about to stop having your period (premenopausal) and  you may become pregnant, seek counseling before you get pregnant.  Take 400 to 800 micrograms (mcg) of folic acid every day if you become pregnant.  Ask for birth control (contraception) if you want to prevent pregnancy. Osteoporosis and menopause Osteoporosis is a disease in which the bones lose minerals and strength with aging. This can result in bone fractures. If you are 65 years old or older, or if you are at risk for osteoporosis and fractures, ask your health care provider if you should:  Be screened for bone loss.  Take a calcium or vitamin D supplement to lower your risk of fractures.  Be given hormone replacement therapy (HRT) to treat symptoms of menopause. Follow these instructions at home: Lifestyle  Do not use any products that contain nicotine or tobacco, such as cigarettes, e-cigarettes, and chewing tobacco. If you need help quitting, ask your health care provider.  Do not use street drugs.  Do not share needles.  Ask your health care provider for help if you need support or information about quitting drugs. Alcohol use  Do not drink alcohol if: ? Your health care provider tells you not to drink. ? You are pregnant, may be pregnant, or are planning to become pregnant.  If you drink alcohol: ? Limit how much you use to 0-1 drink a day. ? Limit intake if you are breastfeeding.  Be aware of how much alcohol is in your drink. In the [place] one drink equals one 12 oz bottle of beer (355 mL), one 5 oz glass of wine (148 mL), or one 1 oz glass of hard liquor (44 mL). General instructions  Schedule regular health, dental, and eye exams.  Stay current with your vaccines.  Tell your health care provider if: ? You often feel depressed. ? You have ever been abused or do not feel safe at home. Summary  Adopting a healthy lifestyle and getting preventive care are important in promoting health and wellness.  Follow your health care provider's instructions about healthy  diet, exercising, and getting tested or screened for diseases.  Follow your health care provider's instructions on monitoring your cholesterol and blood pressure. This information is not intended to replace advice given to you by your health care provider. Make sure you discuss any questions you have with your health care provider. Document Released: 01/22/2011 Document Revised: 07/02/2018 Document Reviewed: 07/02/2018 Elsevier Patient Education  2020 [employer]  

## 2019-03-10 NOTE — Progress Notes (Signed)
Pt is present today for annual exam. Pt stated that she is doing well no problems.  

## 2019-03-10 NOTE — Progress Notes (Signed)
GYNECOLOGY ANNUAL PHYSICAL EXAM PROGRESS NOTE  Subjective:    Karen Donaldson is a 53 y.o. [id number] female who presents for an annual exam. The patient has no complaints today. The patient is sexually active. The patient wears seatbelts: yes. The patient participates in regular exercise: yes (5 times per week, walking). Has the patient ever been transfused or tattooed?: no. The patient reports that there is not domestic violence in her life.    Gynecologic History Menarche age: 61 No LMP recorded. (Menstrual status: Perimenopausal).  Cycles are irregular. Last cycle in April.  Contraception: tubal ligation History of STI's: Denies  Last Pap: 02/2017. Results were: abnormal, NILM with HR HPV+.  History of prior abnormal pap smear,  NILM but HPV+ in 2018, ASCUS HR HPV+ in 2016.  S/p Colposcopy with CIN I.   Last mammogram: 02/21/2018. Results were: normal.   Last Colonoscopy: 01/2016.  Results were normal. PCP Provider: Dr. Etter Sjogren, [facility]    OB History  Gravida Para Term Preterm AB Living  2 2 2  0 0 2  SAB TAB Ectopic Multiple Live Births  0 0 0 0 2    # Outcome Date GA Lbr Len/2nd Weight Sex Delivery Anes PTL Lv  2 Term 2012   6 lb (2.722 kg) M CS-LTranv   LIV  1 Term 2009   5 lb 2.1 oz (2.327 kg) M CS-LTranv   LIV     Past Medical History:  Diagnosis Date  . Abnormal Pap smear of cervix 01/2015   ascus/pos  . Allergy   . Dyslipidemia   . High risk HPV infection   . Hyperglycemia   . Hypertension   . Hypertrophy, uterus   . Stress incontinence   . Vitamin D deficiency     Past Surgical History:  Procedure Laterality Date  . CESAREAN SECTION  2009, 2012  . COLONOSCOPY WITH PROPOFOL N/A 02/03/2016   Procedure: COLONOSCOPY WITH PROPOFOL;  Surgeon: Lucilla Lame, MD;  Location: [facility];  Service: Endoscopy;  Laterality: N/A;  . TUBAL LIGATION      Family History  Problem Relation Age of Onset  . Hypertension Mother   .  Diabetes Mother   . Dementia Mother   . Hypertension Father   . Breast cancer Maternal Aunt   . Ovarian cancer Neg Hx   . Colon cancer Neg Hx   . Heart disease Neg Hx     Social History   Socioeconomic History  . Marital status: Married    Spouse name: Elta Guadeloupe  . Number of children: 2  . Years of education: Not on file  . Highest education level: Master's degree (e.g., MA, MS, MEng, MEd, MSW, MBA)  Occupational History  . Not on file  Social Needs  . Financial resource strain: Not hard at all  . Food insecurity    Worry: Never true    Inability: Never true  . Transportation needs    Medical: No    Non-medical: No  Tobacco Use  . Smoking status: Never Smoker  . Smokeless tobacco: Never Used  Substance and Sexual Activity  . Alcohol use: No    Alcohol/week: 0.0 standard drinks  . Drug use: No  . Sexual activity: Yes    Partners: Male    Birth control/protection: Surgical  Lifestyle  . Physical activity    Days per week: 4 days    Minutes per session: 30 min  . Stress: Not at all  Relationships  .  Social connections    Talks on phone: More than three times a week    Gets together: More than three times a week    Attends religious service: 1 to 4 times per year    Active member of club or organization: Yes    Attends meetings of clubs or organizations: 1 to 4 times per year    Relationship status: Married  . Intimate partner violence    Fear of current or ex partner: No    Emotionally abused: No    Physically abused: No    Forced sexual activity: No  Other Topics Concern  . Not on file  Social History Narrative  . Not on file    Current Outpatient Medications on File Prior to Visit  Medication Sig Dispense Refill  . Calcium Carbonate-Vitamin D 600-400 MG-UNIT per chew tablet Chew 1 tablet by mouth daily.    Marland Kitchen diltiazem (CARDIZEM CD) 180 MG 24 hr capsule Take 1 capsule (180 mg total) by mouth daily. 90 capsule 1  . FIBER PO Take by mouth daily as needed.    .  fluticasone (FLONASE) 50 MCG/ACT nasal spray Place 2 sprays into both nostrils daily. 16 g 6  . hydrochlorothiazide (MICROZIDE) 12.5 MG capsule Take 1 capsule (12.5 mg total) by mouth every morning. 90 capsule 1  . loratadine (CLARITIN) 10 MG tablet Take 10 mg by mouth daily.    . montelukast (SINGULAIR) 10 MG tablet TAKE 1 TABLET BY MOUTH EVERY DAY 90 tablet 1  . MULTIPLE VITAMIN PO Take 1 tablet by mouth daily.     No current facility-administered medications on file prior to visit.     Allergies  Allergen Reactions  . Ace Inhibitors   . Sulfamethoxazole-Trimethoprim Rash    Review of Systems Constitutional: negative for chills, fatigue, fevers and sweats Eyes: negative for irritation, redness and visual disturbance Ears, nose, mouth, throat, and face: negative for hearing loss, nasal congestion, snoring and tinnitus Respiratory: negative for asthma, cough, sputum Cardiovascular: negative for chest pain, dyspnea, exertional chest pressure/discomfort, irregular heart beat, palpitations and syncope Gastrointestinal: negative for abdominal pain, change in bowel habits, nausea and vomiting Genitourinary: positive for irregular menstrual periods (still occasionally skipping 2-3 months).  Negative for genital lesions, sexual problems and vaginal discharge, dysuria and urinary incontinence Integument/breast: negative for breast lump, breast tenderness and nipple discharge Hematologic/lymphatic: negative for bleeding and easy bruising Musculoskeletal:negative for back pain and muscle weakness Neurological: negative for dizziness, headaches, vertigo and weakness Endocrine: negative for diabetic symptoms including polydipsia, polyuria and skin dryness Allergic/Immunologic: negative for hay fever and urticaria      Objective:  Blood pressure (!) 144/94, pulse 78, height 5' 1"  (1.549 m), weight 199 lb 9.6 oz (90.5 kg). Body mass index is 37.71 kg/m.   General Appearance:    Alert,  cooperative, no distress, appears stated age, moderately obese.   Head:    Normocephalic, without obvious abnormality, atraumatic  Eyes:    PERRL, conjunctiva/corneas clear, EOM's intact, both eyes  Ears:    Normal external ear canals, both ears  Nose:   Nares normal, septum midline, mucosa normal, no drainage or sinus tenderness  Throat:   Lips, mucosa, and tongue normal; teeth and gums normal  Neck:   Supple, symmetrical, trachea midline, no adenopathy; thyroid: no enlargement/tenderness/nodules; no carotid bruit or JVD  Back:     Symmetric, no curvature, ROM normal, no CVA tenderness  Lungs:     Clear to auscultation bilaterally, respirations unlabored  Chest Wall:    No tenderness or deformity   Heart:    Regular rate and rhythm, S1 and S2 normal, no murmur, rub or gallop  Breast Exam:    No tenderness, masses, or nipple abnormality  Abdomen:     Soft, non-tender, bowel sounds active all four quadrants, no masses, no organomegaly.    Genitalia:    Pelvic:external genitalia normal, vagina without lesions, discharge, or tenderness, rectovaginal septum  normal. Cervix normal in appearance, no cervical motion tenderness, no adnexal masses or tenderness.  Uterus normal size, shape, mobile, regular contours, nontender.  Rectal:    Normal external sphincter.  No hemorrhoids appreciated. Internal exam not done.   Extremities:   Extremities normal, atraumatic, no cyanosis or edema  Pulses:   2+ and symmetric all extremities  Skin:   Skin color, texture, turgor normal, no rashes or lesions  Lymph nodes:   Cervical, supraclavicular, and axillary nodes normal  Neurologic:   CNII-XII intact, normal strength, sensation and reflexes throughout   .  Labs:  Lab Results  Component Value Date   WBC 4.4 02/13/2019   HGB 15.8 (H) 02/13/2019   HCT 47.6 (H) 02/13/2019   MCV 84.7 02/13/2019   PLT 225 02/13/2019    Lab Results  Component Value Date   CREATININE 1.05 02/13/2019   BUN 20 02/13/2019    NA 137 02/13/2019   K 4.6 02/13/2019   CL 105 02/13/2019   CO2 19 (L) 02/13/2019    Lab Results  Component Value Date   ALT 17 02/13/2019   AST 21 02/13/2019   ALKPHOS 85 09/01/2018   BILITOT 0.6 02/13/2019    Lab Results  Component Value Date   CHOL 231 (H) 02/13/2019   HDL 52 02/13/2019   LDLCALC 155 (H) 02/13/2019   TRIG 120 02/13/2019   CHOLHDL 4.4 02/13/2019    No results found for: TSH  Lab Results  Component Value Date   HGBA1C 5.7 (H) 02/13/2019    Results for SATIN, BOAL (MRN [id number]) as of 03/04/2018 08:34  Ref. Range 02/12/2018 09:06  Vitamin D, 25-Hydroxy Latest Ref Range: 30 - 100 ng/mL 28 (L)    Assessment:   Routine gynecologic exam.   H/o CIN I Benign HTN Hyperlipidemia Perimenopausal status Pre-diabetes  Plan:    Has had annual labs by PCP.  Benign HTN, pre-diabetes, hyperlipidemia managed by PCP.  Breast self exam technique reviewed and patient encouraged to perform self-exam monthly. Contraception: tubal ligation. Discussed healthy lifestyle modifications. Declined referral to [facility] Pre-di[facility] program.  Mammogram due, ordered today.  Colonoscopy up to date (01/2016) . Pap smear performed today for h/o abnormal pap smears and CIN I on coloposcopy.   Perimenopausal status - reiterated diagnoses and expectations in upcoming years.  Follow up in 1 year.   Rubie Maid, MD [facility]

## 2019-03-13 LAB — CYTOLOGY - PAP
Adequacy: ABSENT
Diagnosis: NEGATIVE

## 2019-04-14 ENCOUNTER — Ambulatory Visit (INDEPENDENT_AMBULATORY_CARE_PROVIDER_SITE_OTHER): Payer: BC Managed Care – PPO

## 2019-04-14 ENCOUNTER — Other Ambulatory Visit: Payer: Self-pay

## 2019-04-14 DIAGNOSIS — Z23 Encounter for immunization: Secondary | ICD-10-CM

## 2019-04-24 ENCOUNTER — Encounter: Payer: Self-pay | Admitting: Family Medicine

## 2019-05-19 ENCOUNTER — Ambulatory Visit
Admission: RE | Admit: 2019-05-19 | Discharge: 2019-05-19 | Disposition: A | Discharge status: Home / Self Care | Payer: BC Managed Care – PPO | Source: Ambulatory Visit | Attending: Obstetrics and Gynecology | Admitting: Obstetrics and Gynecology

## 2019-05-19 DIAGNOSIS — Z1231 Encounter for screening mammogram for malignant neoplasm of breast: Secondary | ICD-10-CM | POA: Diagnosis not present

## 2019-08-05 ENCOUNTER — Other Ambulatory Visit: Payer: Self-pay | Admitting: Family Medicine

## 2019-08-05 DIAGNOSIS — J302 Other seasonal allergic rhinitis: Secondary | ICD-10-CM

## 2019-08-05 DIAGNOSIS — I10 Essential (primary) hypertension: Secondary | ICD-10-CM

## 2019-08-12 ENCOUNTER — Encounter: Payer: Self-pay | Admitting: Family Medicine

## 2019-08-12 ENCOUNTER — Other Ambulatory Visit: Payer: Self-pay | Admitting: Family Medicine

## 2019-08-12 DIAGNOSIS — I10 Essential (primary) hypertension: Secondary | ICD-10-CM

## 2019-08-13 ENCOUNTER — Encounter: Payer: Self-pay | Admitting: Family Medicine

## 2019-08-13 ENCOUNTER — Ambulatory Visit: Payer: BC Managed Care – PPO | Admitting: Family Medicine

## 2019-08-13 ENCOUNTER — Other Ambulatory Visit: Payer: Self-pay

## 2019-08-13 VITALS — BP 130/80 | HR 90 | Temp 97.1°F | Resp 16 | Ht 61.0 in | Wt 207.2 lb

## 2019-08-13 DIAGNOSIS — I10 Essential (primary) hypertension: Secondary | ICD-10-CM | POA: Diagnosis not present

## 2019-08-13 DIAGNOSIS — K5909 Other constipation: Secondary | ICD-10-CM

## 2019-08-13 DIAGNOSIS — Z8719 Personal history of other diseases of the digestive system: Secondary | ICD-10-CM | POA: Insufficient documentation

## 2019-08-13 DIAGNOSIS — R7303 Prediabetes: Secondary | ICD-10-CM

## 2019-08-13 DIAGNOSIS — J302 Other seasonal allergic rhinitis: Secondary | ICD-10-CM

## 2019-08-13 DIAGNOSIS — E559 Vitamin D deficiency, unspecified: Secondary | ICD-10-CM

## 2019-08-13 DIAGNOSIS — E785 Hyperlipidemia, unspecified: Secondary | ICD-10-CM

## 2019-08-13 DIAGNOSIS — E669 Obesity, unspecified: Secondary | ICD-10-CM

## 2019-08-13 DIAGNOSIS — J3089 Other allergic rhinitis: Secondary | ICD-10-CM

## 2019-08-13 MED ORDER — DILTIAZEM HCL ER COATED BEADS 180 MG PO CP24: 180.0000 mg | ORAL_CAPSULE | Freq: Every day | ORAL | 0 refills | Status: DC

## 2019-08-13 MED ORDER — POLYETHYLENE GLYCOL 3350 17 GM/SCOOP PO POWD: 1.0000 | Freq: Once | ORAL | 2 refills | Status: AC

## 2019-08-13 NOTE — Progress Notes (Signed)
Name: Karen Donaldson   MRN: [id number]    DOB: 10-10-1965   Date:08/13/2019       Progress Note  Subjective  Chief Complaint  Chief Complaint  Patient presents with  . Medication Refill    6 month F/U-feels like she is ovulating  . Hypertension    Has had swelling in her feet and ankles recently  . Obesity    Has been gaining 2 pounds every day recently  . History CIN I  with HPV  . Hyperlipidemia  . Allergic Rhinitis   . Chronic constipation    Very bloated but laxative has helped with BM-Last BM was last weekend  . Otalgia    Onset-2 weeks, feels like she is getting a infection-would like it looked at    HPI  HTN: patient states she has been compliant with her medication, no chest pain, no palpitation, noedema.BP athome is within normal limits, bp today   History of colitis: seen at [facility], had CT abdomen 08/2018 and showed left side colitis, per Dr. Durwin Reges likely ischemic colitis  Chronic constipation: BM's have been regular - she has a bowel movement about 2-3 times a week, [place] 1. Seems to be worse since colitis 08/2018 but has a history of constipation  since childhood. Colonoscopy up to date. She states feeling bloated for the past few weeks, not emptying enough, taking fiber pills. Discussed Trulance , but looks like not covered by insurance. We will try miralax, stop otc laxatives   HistoryCIN I with HPV: seesDr. Leeanne Rio colposcopyin 03/2015, repeat pap smear was normal 02/2017, but positive HPV, she had endocervical curettage and was negative, she went back 02/2018 and had same findings, and biopsy also negative, repeat pap smear 02/2019 was also normal, she will go back in one year   AR: she has intermittent symptoms, she has episodes of nasal congestion( present now), and a dry cough,usually triggered by weather changes.She takes singulair daily and flonase , she states recently having left ear pain, sharp and intermittently, no hearing loss or  tinnitus   Hyperlipidemia: she is not taking medication. Explained since ischemic colitis she may re-consider taking statin therapy  but she wants to hold off for now  The 10-year ASCVD risk score Mikey Bussing DC Brooke Bonito., et al., 2013) is: 5.5%   Values used to calculate the score:     Age: 54 years     Sex: Female     Is Non-Hispanic African American: Yes     Diabetic: No     Tobacco smoker: No     Systolic Blood Pressure: 267 mmHg     Is BP treated: Yes     HDL Cholesterol: 52 mg/dL     Total Cholesterol: 231 mg/dL  Pre-diabetes: she denies polyphagia, polyuria or polydipsia.Last hgbA1C 5.7%one year ago, she has family history of DM ( Mother ). Unchanged   Obesity:she has gained 5 lbs in the past year, but she states most of the weight was from gaining weight this month, states started with bloating and worsening of constipation, not abdominal pain She has been walking on her treadmill or outdoors, eating healthier, discussed more water and continue hard work    Patient Active Problem List   Diagnosis Date Noted  . CIN I (cervical intraepithelial neoplasia I) 02/22/2015  . Benign essential HTN 02/06/2015  . Dyslipidemia 02/06/2015  . Blood glucose elevated 02/06/2015  . Obesity (BMI 30-39.9) 02/06/2015  . Perennial allergic rhinitis with seasonal variation 02/06/2015  . Stress  incontinence 02/06/2015  . Vitamin D deficiency 02/06/2015    Past Surgical History:  Procedure Laterality Date  . CESAREAN SECTION  2009, 2012  . COLONOSCOPY WITH PROPOFOL N/A 02/03/2016   Procedure: COLONOSCOPY WITH PROPOFOL;  Surgeon: Lucilla Lame, MD;  Location: [place];  Service: Endoscopy;  Laterality: N/A;  . TUBAL LIGATION      Family History  Problem Relation Age of Onset  . Hypertension Mother   . Diabetes Mother   . Dementia Mother   . Hypertension Father   . Breast cancer Maternal Aunt   . Ovarian cancer Neg Hx   . Colon cancer Neg Hx   . Heart disease Neg Hx     Social  History   Socioeconomic History  . Marital status: Married    Spouse name: Elta Guadeloupe  . Number of children: 2  . Years of education: Not on file  . Highest education level: Master's degree (e.g., MA, MS, MEng, MEd, MSW, MBA)  Occupational History  . Not on file  Tobacco Use  . Smoking status: Never Smoker  . Smokeless tobacco: Never Used  Substance and Sexual Activity  . Alcohol use: No    Alcohol/week: 0.0 standard drinks  . Drug use: No  . Sexual activity: Yes    Partners: Male    Birth control/protection: Surgical  Other Topics Concern  . Not on file  Social History Narrative  . Not on file   Social Determinants of Health   Financial Resource Strain: Low Risk   . Difficulty of Paying Living Expenses: Not hard at all  Food Insecurity: No Food Insecurity  . Worried About Charity fundraiser in the Last Year: Never true  . Ran Out of Food in the Last Year: Never true  Transportation Needs: No Transportation Needs  . Lack of Transportation (Medical): No  . Lack of Transportation (Non-Medical): No  Physical Activity: Insufficiently Active  . Days of Exercise per Week: 2 days  . Minutes of Exercise per Session: 30 min  Stress: No Stress Concern Present  . Feeling of Stress : Not at all  Social Connections: Not Isolated  . Frequency of Communication with Friends and Family: More than three times a week  . Frequency of Social Gatherings with Friends and Family: More than three times a week  . Attends Religious Services: More than 4 times per year  . Active Member of Clubs or Organizations: Yes  . Attends Archivist Meetings: More than 4 times per year  . Marital Status: Married  Human resources officer Violence: Not At Risk  . Fear of Current or Ex-Partner: No  . Emotionally Abused: No  . Physically Abused: No  . Sexually Abused: No     Current Outpatient Medications:  .  Calcium Carbonate-Vitamin D 600-400 MG-UNIT per chew tablet, Chew 1 tablet by mouth daily., Disp:  , Rfl:  .  diltiazem (CARDIZEM CD) 180 MG 24 hr capsule, TAKE 1 CAPSULE BY MOUTH EVERY DAY, Disp: 90 capsule, Rfl: 0 .  FIBER PO, Take by mouth daily as needed., Disp: , Rfl:  .  fluticasone (FLONASE) 50 MCG/ACT nasal spray, Place 2 sprays into both nostrils daily., Disp: 16 g, Rfl: 6 .  hydrochlorothiazide (MICROZIDE) 12.5 MG capsule, TAKE 1 CAPSULE BY MOUTH EVERY DAY IN THE MORNING, Disp: 90 capsule, Rfl: 1 .  loratadine (CLARITIN) 10 MG tablet, Take 10 mg by mouth daily., Disp: , Rfl:  .  MULTIPLE VITAMIN PO, Take 1 tablet by mouth  daily., Disp: , Rfl:  .  montelukast (SINGULAIR) 10 MG tablet, TAKE 1 TABLET BY MOUTH EVERY DAY (Patient not taking: Reported on 08/13/2019), Disp: 90 tablet, Rfl: 1  Allergies  Allergen Reactions  . Ace Inhibitors   . Sulfamethoxazole-Trimethoprim Rash    I personally reviewed active problem list, medication list, allergies, family history, social history with the patient/caregiver today.   ROS  Constitutional: Negative for fever or weight change.  Respiratory: Negative for cough and shortness of breath.   Cardiovascular: Negative for chest pain or palpitations.  Gastrointestinal: Negative for abdominal pain, no bowel changes.  Musculoskeletal: Negative for gait problem or joint swelling.  Skin: Negative for rash.  Neurological: Negative for dizziness or headache.  No other specific complaints in a complete review of systems (except as listed in HPI above).  Objective  Vitals:   08/13/19 1110  BP: 130/80  Pulse: 90  Resp: 16  Temp: (!) 97.1 F (36.2 C)  TempSrc: Temporal  SpO2: 97%  Weight: 207 lb 3.2 oz (94 kg)  Height: 5' 1"  (1.549 m)    Body mass index is 39.15 kg/m.  Physical Exam  Constitutional: Patient appears well-developed and well-nourished. Obese No distress.  HEENT: head atraumatic, normocephalic, pupils equal and reactive to light Cardiovascular: Normal rate, regular rhythm and normal heart sounds.  No murmur heard. No BLE  edema. Pulmonary/Chest: Effort normal and breath sounds normal. No respiratory distress. Abdominal: Soft.  There is no tenderness. Psychiatric: Patient has a normal mood and affect. behavior is normal. Judgment and thought content normal.  PHQ2/9: Depression screen [facility] 2/9 08/13/2019 02/13/2019 08/15/2018 02/12/2018 08/14/2017  Decreased Interest 0 0 0 0 0  Down, Depressed, Hopeless 0 0 0 0 0  PHQ - 2 Score 0 0 0 0 0  Altered sleeping 0 0 - 0 -  Tired, decreased energy 1 0 - 0 -  Change in appetite 0 0 - 0 -  Feeling bad or failure about yourself  0 0 - 0 -  Trouble concentrating 0 0 - 0 -  Moving slowly or fidgety/restless 0 0 - - -  Suicidal thoughts 0 0 - 0 -  PHQ-9 Score 1 0 - 0 -  Difficult doing work/chores Not difficult at all Not difficult at all - - -    phq 9 is negative   Fall Risk: Fall Risk  08/13/2019 02/13/2019 08/15/2018 05/30/2018 02/12/2018  Falls in the past year? 0 0 0 0 No  Number falls in past yr: 0 0 0 - -  Injury with Fall? 0 0 0 - -    Functional Status Survey: Is the patient deaf or have difficulty hearing?: No Does the patient have difficulty seeing, even when wearing glasses/contacts?: Yes Does the patient have difficulty concentrating, remembering, or making decisions?: No Does the patient have difficulty walking or climbing stairs?: No Does the patient have difficulty dressing or bathing?: No Does the patient have difficulty doing errands alone such as visiting a doctor's office or shopping?: No    Assessment & Plan  1. Benign essential HTN  - diltiazem (CARDIZEM CD) 180 MG 24 hr capsule; Take 1 capsule (180 mg total) by mouth daily.  Dispense: 90 capsule; Refill: 0  2. Dyslipidemia  Refuses medication   3. Vitamin D deficiency  Continue vitamin D supplementation   4. Obesity (BMI 30-39.9)  Discussed with the patient the risk posed by an increased BMI. Discussed importance of portion control, calorie counting and at least 150 minutes of  physical activity weekly. Avoid sweet beverages and drink more water. Eat at least 6 servings of fruit and vegetables daily   5. Pre-diabetes   6. Chronic constipation  - polyethylene glycol powder (GLYCOLAX/MIRALAX) 17 GM/SCOOP powder; Take 255 g by mouth once for 1 dose.  Dispense: 255 g; Refill: 2  7. Perennial allergic rhinitis with seasonal variation   8. History of ischemic colitis  No episodes since 08/2018

## 2019-08-17 ENCOUNTER — Ambulatory Visit: Payer: BC Managed Care – PPO | Admitting: Family Medicine

## 2019-10-12 ENCOUNTER — Other Ambulatory Visit: Payer: Self-pay | Admitting: Family Medicine

## 2019-10-12 DIAGNOSIS — K5909 Other constipation: Secondary | ICD-10-CM

## 2019-10-12 NOTE — Telephone Encounter (Signed)
Requested medications are due for refill today?  Historical Med  Requested medications are on active medication list?  Fiber listed as historical med.  Otherwise - NO  Last Refill:  Unknown  Future visit scheduled?  Yes  Notes to Clinic:

## 2019-10-28 ENCOUNTER — Other Ambulatory Visit: Payer: Self-pay | Admitting: Family Medicine

## 2019-10-28 DIAGNOSIS — I10 Essential (primary) hypertension: Secondary | ICD-10-CM

## 2019-10-28 NOTE — Telephone Encounter (Signed)
Requested Prescriptions  Pending Prescriptions Disp Refills  . diltiazem (CARDIZEM CD) 180 MG 24 hr capsule [Pharmacy Med Name: DILTIAZEM 24H ER(CD) 180 MG CP] 90 capsule 0    Sig: TAKE 1 CAPSULE BY MOUTH EVERY DAY     Cardiovascular:  Calcium Channel Blockers Passed - 10/28/2019  2:17 AM      Passed - Last BP in normal range    BP Readings from Last 1 Encounters:  08/13/19 130/80         Passed - Valid encounter within last 6 months    Recent Outpatient Visits          2 months ago Benign essential HTN   [facility], Drue Stager, MD   8 months ago Benign essential HTN   [facility] Steele Sizer, MD   1 year ago Pancolitis [facility])   [facility] Steele Sizer, MD   1 year ago Dyslipidemia   [facility] Steele Sizer, MD   1 year ago Cough   [place], NP      Future Appointments            In 3 months Ancil Boozer, Drue Stager, MD [facility], [place]   In 4 months Rubie Maid, MD [facility]

## 2020-01-07 ENCOUNTER — Other Ambulatory Visit: Payer: Self-pay | Admitting: Family Medicine

## 2020-01-07 DIAGNOSIS — I10 Essential (primary) hypertension: Secondary | ICD-10-CM

## 2020-01-13 IMAGING — CT CT ABD-PELV W/ CM
2 of 5 series · 16 of 46 positions shown, 18 images · IV contrast (APPLIED)
Comparison: None.

CLINICAL DATA: Lower abdominal pain and vomiting starting
yesterday.

EXAM:
CT ABDOMEN AND PELVIS WITH CONTRAST
TECHNIQUE: Multidetector CT imaging of the abdomen and pelvis was performed
using the standard protocol following bolus administration of
intravenous contrast.
CONTRAST:  100mL OMNIPAQUE IOHEXOL 300 MG/ML  SOLN

[Series 2: routine abd/pel with · axial · 0.80mm/px · z∈[-483,-48]mm · 13 of 99 slices shown, 15 images]
[im 6/99  soft-tissue]
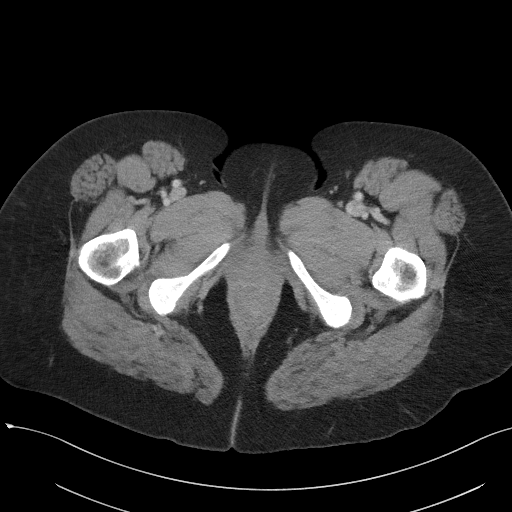
[im 6/99  bone]
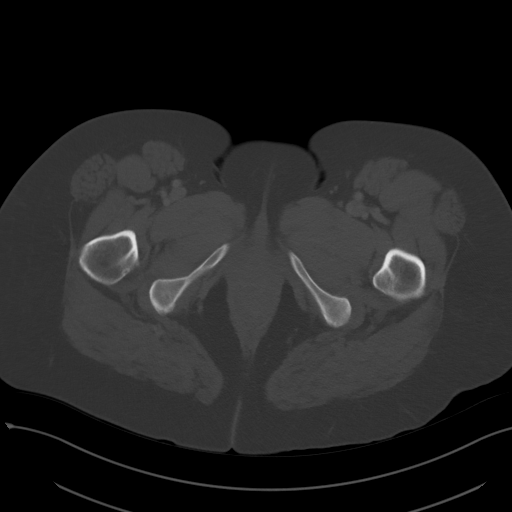
[im 16/99  soft-tissue]
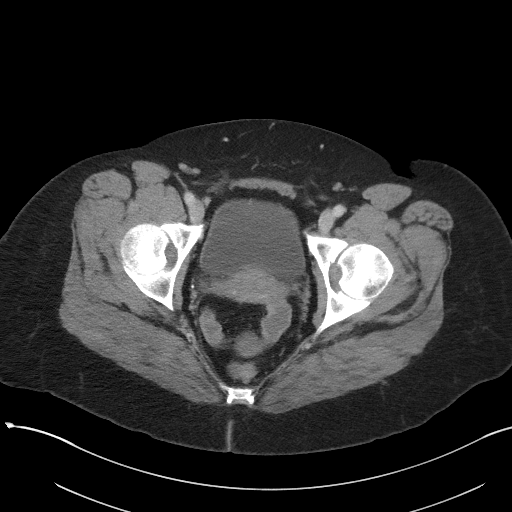
[im 21/99  soft-tissue]
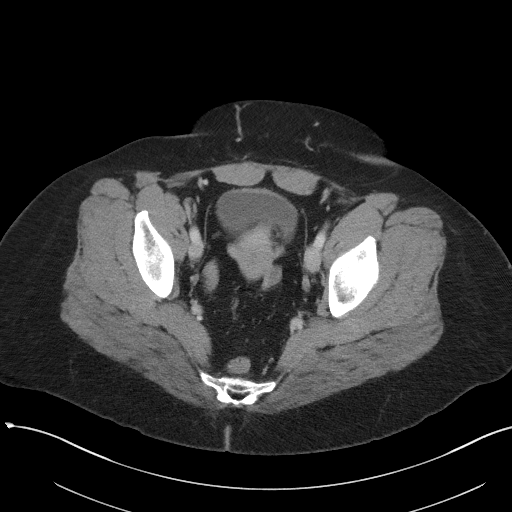
[im 26/99  soft-tissue]
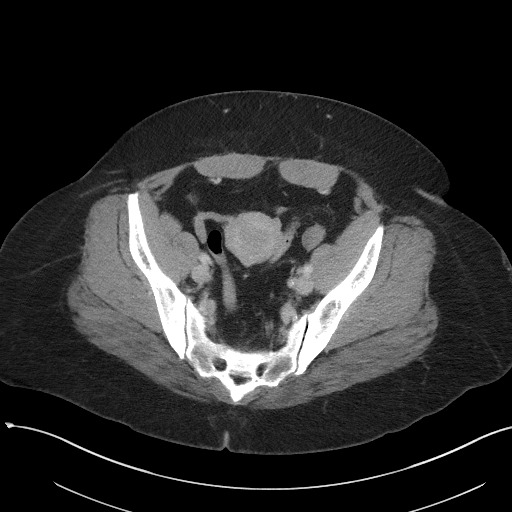
[im 37/99  soft-tissue]
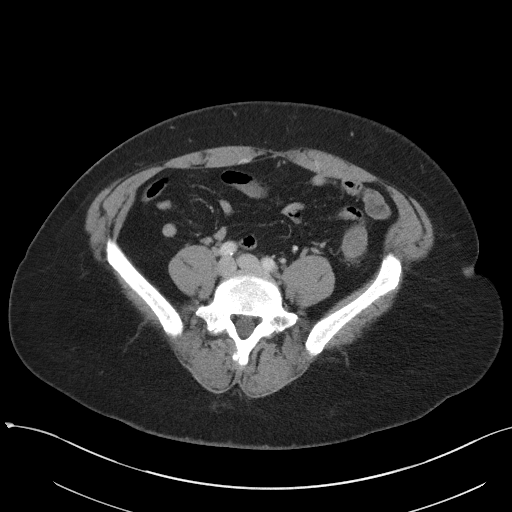
[im 42/99  soft-tissue]
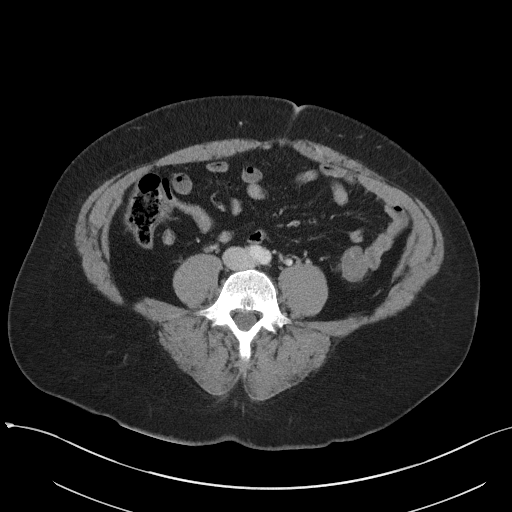
[im 52/99  soft-tissue]
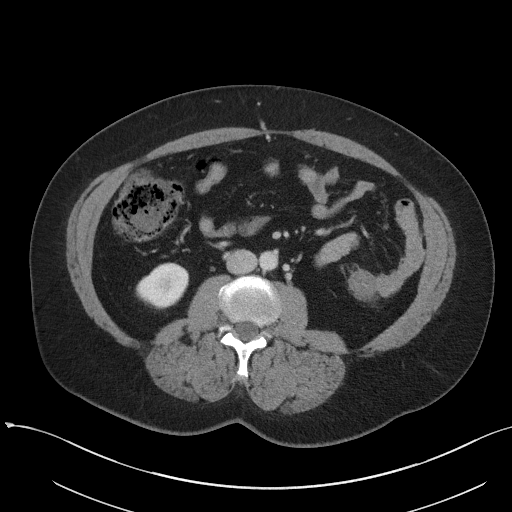
[im 57/99  soft-tissue]
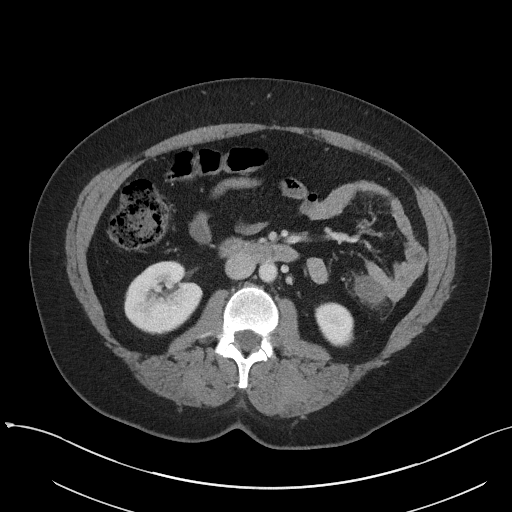
[im 62/99  soft-tissue]
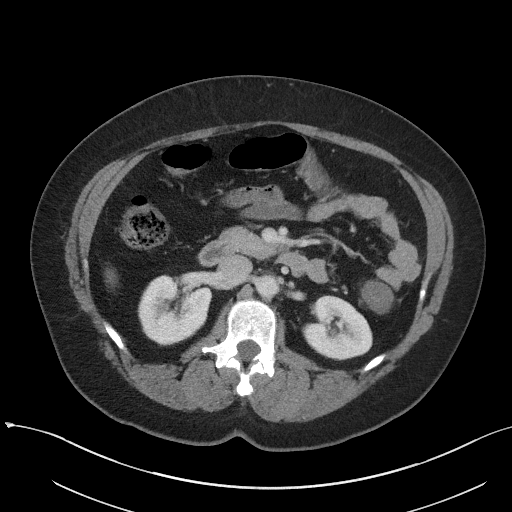
[im 62/99  bone]
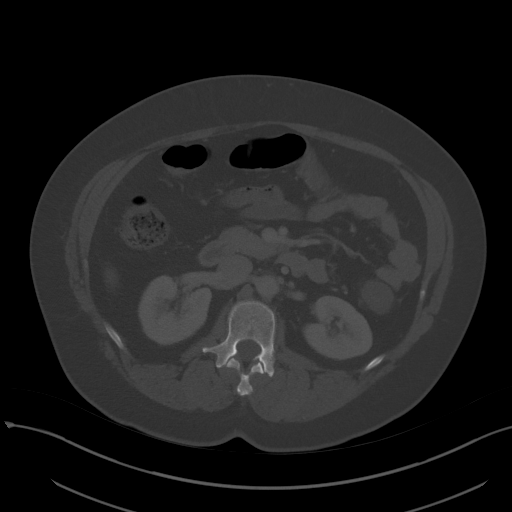
[im 73/99  soft-tissue]
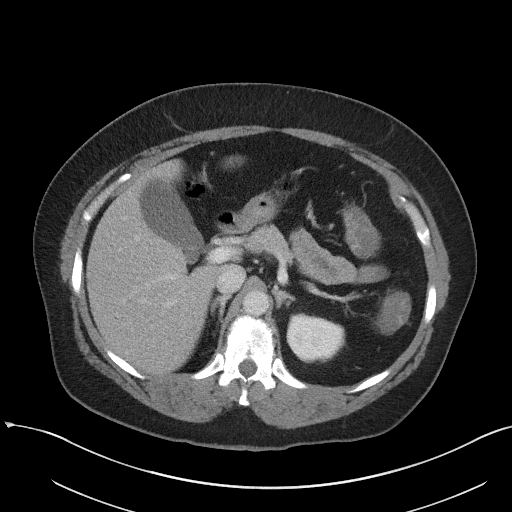
[im 78/99  soft-tissue]
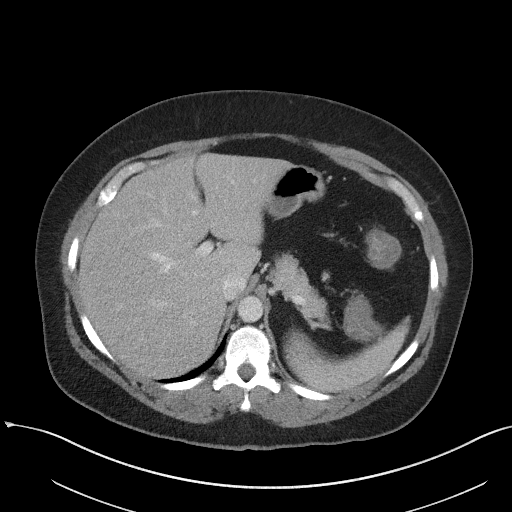
[im 83/99  soft-tissue]
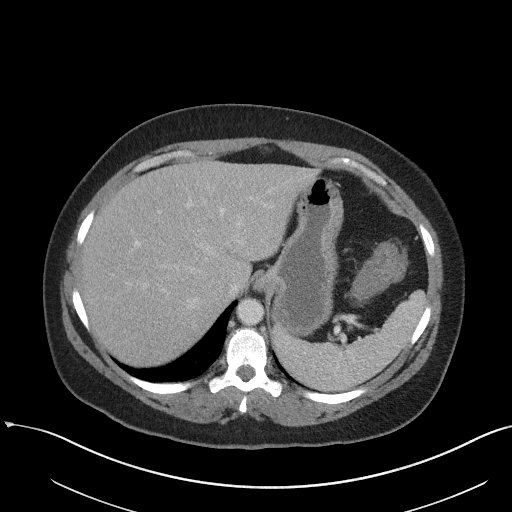
[im 93/99  soft-tissue]
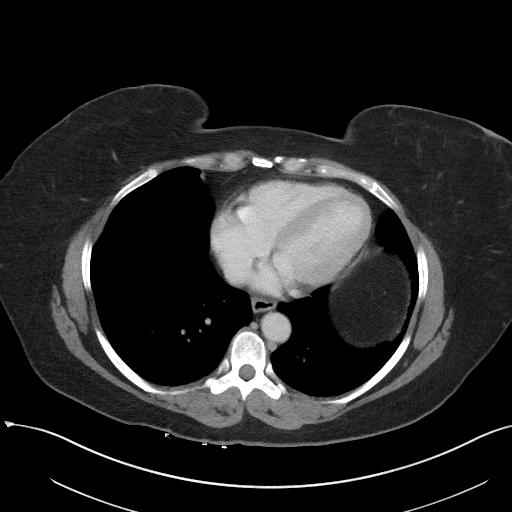

[Series 5: coronal st · coronal · 0.71mm/px · 3 of 103 slices shown]
[im 35/103  soft-tissue]
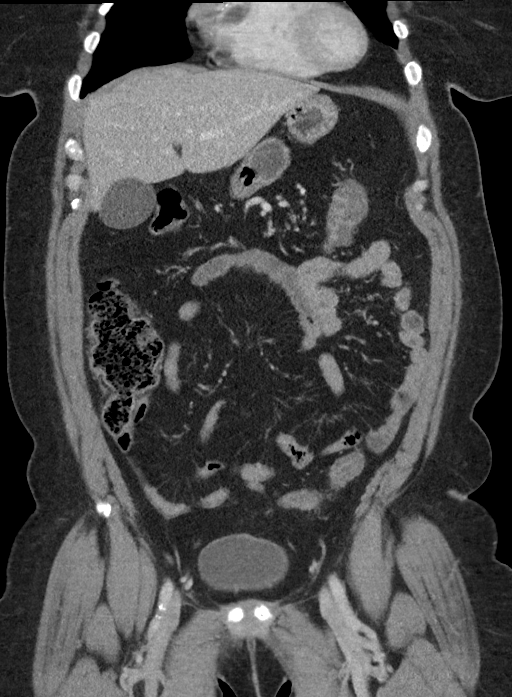
[im 46/103  soft-tissue]
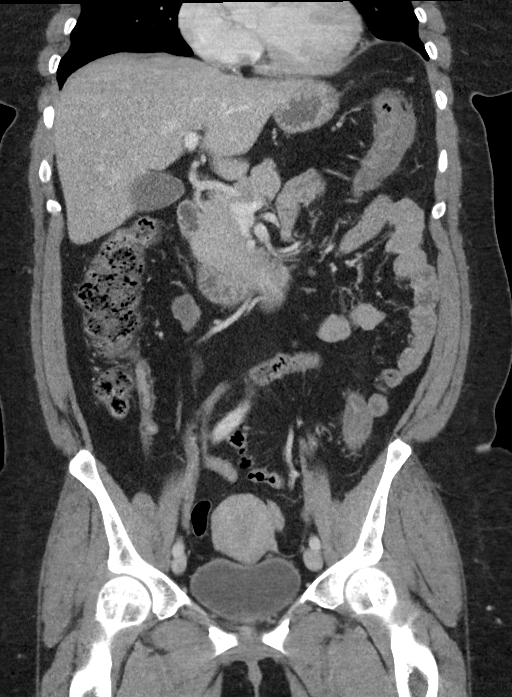
[im 57/103  soft-tissue]
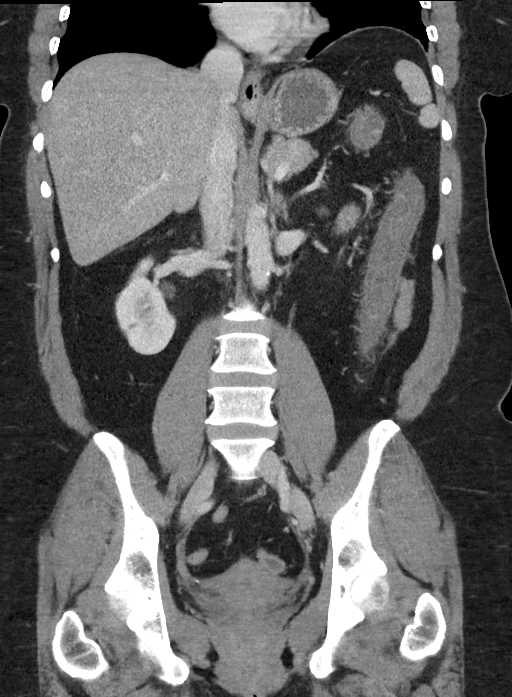

[16 of 46 positions shown; findings below may reference images not displayed]

FINDINGS: Lower chest: No acute abnormality.

Hepatobiliary: No focal liver abnormality is seen. No gallstones,
gallbladder wall thickening, or biliary dilatation.

Pancreas: Unremarkable. No pancreatic ductal dilatation or
surrounding inflammatory changes.

Spleen: Normal in size without focal abnormality.

Adrenals/Urinary Tract: Adrenal glands are unremarkable. Kidneys are
normal, without renal calculi, focal lesion, or hydronephrosis.
Bladder is unremarkable.

Stomach/Bowel: Thickening of the walls of the LEFT colon and distal
transverse colon, with subtle pericolic inflammation, consistent
with acute colitis.

No dilated large or small bowel loops. Stomach is unremarkable,
partially decompressed.

Vascular/Lymphatic: No significant vascular findings are present. No
enlarged abdominal or pelvic lymph nodes.

Reproductive: Uterus and bilateral adnexa are unremarkable.

Other: No abscess collection. No free intraperitoneal air.

Musculoskeletal: No acute or significant osseous findings.
IMPRESSION: 1. Thickening of the walls of the LEFT colon and distal transverse
colon, with subtle paracolic inflammation, consistent with acute
colitis of infectious or inflammatory nature.
2. Remainder of the abdomen and pelvis CT is unremarkable, as
detailed above.

## 2020-02-10 ENCOUNTER — Ambulatory Visit: Payer: BC Managed Care – PPO | Admitting: Family Medicine

## 2020-03-10 ENCOUNTER — Other Ambulatory Visit (HOSPITAL_COMMUNITY)
Admission: RE | Admit: 2020-03-10 | Discharge: 2020-03-10 | Disposition: A | Discharge status: Home / Self Care | Payer: BC Managed Care – PPO | Source: Ambulatory Visit | Attending: Obstetrics and Gynecology | Admitting: Obstetrics and Gynecology

## 2020-03-10 ENCOUNTER — Ambulatory Visit (INDEPENDENT_AMBULATORY_CARE_PROVIDER_SITE_OTHER): Payer: BC Managed Care – PPO | Admitting: Obstetrics and Gynecology

## 2020-03-10 ENCOUNTER — Encounter: Payer: Self-pay | Admitting: Obstetrics and Gynecology

## 2020-03-10 ENCOUNTER — Other Ambulatory Visit: Payer: Self-pay

## 2020-03-10 VITALS — BP 118/80 | HR 78 | Ht 61.0 in | Wt 210.6 lb

## 2020-03-10 DIAGNOSIS — Z8619 Personal history of other infectious and parasitic diseases: Secondary | ICD-10-CM

## 2020-03-10 DIAGNOSIS — E669 Obesity, unspecified: Secondary | ICD-10-CM

## 2020-03-10 DIAGNOSIS — N951 Menopausal and female climacteric states: Secondary | ICD-10-CM

## 2020-03-10 DIAGNOSIS — Z1231 Encounter for screening mammogram for malignant neoplasm of breast: Secondary | ICD-10-CM

## 2020-03-10 DIAGNOSIS — Z01419 Encounter for gynecological examination (general) (routine) without abnormal findings: Secondary | ICD-10-CM

## 2020-03-10 DIAGNOSIS — E785 Hyperlipidemia, unspecified: Secondary | ICD-10-CM

## 2020-03-10 DIAGNOSIS — R7303 Prediabetes: Secondary | ICD-10-CM

## 2020-03-10 DIAGNOSIS — I10 Essential (primary) hypertension: Secondary | ICD-10-CM

## 2020-03-10 NOTE — Progress Notes (Signed)
Pt present for annual exam. Pt stated that she is doing well no problems.

## 2020-03-10 NOTE — Progress Notes (Addendum)
GYNECOLOGY ANNUAL PHYSICAL EXAM PROGRESS NOTE  Subjective:    Karen Donaldson is a 54 y.o. [phone] perimenopausal female who presents for an annual exam. The patient has no complaints today. The patient is sexually active. The patient wears seatbelts: yes. The patient participates in regular exercise: yes (5 times per week, walking). Has the patient ever been transfused or tattooed?: no. The patient reports that there is not domestic violence in her life.   Endorses some hot flashes and states she keeps her air on in her house. Not bothersome at this time. Has initiated black cohosh. States she is trying to eat a healthy diet and walks for exercise a few days a week.   Gynecologic History Menarche age: 5 No LMP recorded. (Menstrual status: Perimenopausal).  Cycles are irregular. One cycle in April 2020 and last cycle in January 2021. Some spotting in June 2021. Contraception: tubal ligation History of STI's: Denies  Last Pap: 02/2019. Results were: normal. 02/2018 and 02/2017 were both abnormal, NILM with HR HPV+.  ASCUS HR HPV+ in 2016.  S/p Colposcopy with CIN I.   Last mammogram: 05/19/2019. Results were: normal.   Last Colonoscopy: 01/2016.  Results were normal. PCP Provider: Dr. Etter Sjogren, [facility]    OB History  Gravida Para Term Preterm AB Living  2 2 2  0 0 2  SAB TAB Ectopic Multiple Live Births  0 0 0 0 2    # Outcome Date GA Lbr Len/2nd Weight Sex Delivery Anes PTL Lv  2 Term 2012   6 lb (2.722 kg) M CS-LTranv   LIV  1 Term 2009   5 lb 2.1 oz (2.327 kg) M CS-LTranv   LIV     Past Medical History:  Diagnosis Date  . Abnormal Pap smear of cervix 01/2015   ascus/pos  . Allergy   . Dyslipidemia   . High risk HPV infection   . Hyperglycemia   . Hypertension   . Hypertrophy, uterus   . Stress incontinence   . Vitamin D deficiency     Past Surgical History:  Procedure Laterality Date  . CESAREAN SECTION  2009, 2012  . COLONOSCOPY WITH  PROPOFOL N/A 02/03/2016   Procedure: COLONOSCOPY WITH PROPOFOL;  Surgeon: Lucilla Lame, MD;  Location: [place];  Service: Endoscopy;  Laterality: N/A;  . TUBAL LIGATION      Family History  Problem Relation Age of Onset  . Hypertension Mother   . Diabetes Mother   . Dementia Mother   . Hypertension Father   . Breast cancer Maternal Aunt   . Ovarian cancer Neg Hx   . Colon cancer Neg Hx   . Heart disease Neg Hx     Social History   Socioeconomic History  . Marital status: Married    Spouse name: Elta Guadeloupe  . Number of children: 2  . Years of education: Not on file  . Highest education level: Master's degree (e.g., MA, MS, MEng, MEd, MSW, MBA)  Occupational History  . Not on file  Tobacco Use  . Smoking status: Never Smoker  . Smokeless tobacco: Never Used  Vaping Use  . Vaping Use: Never used  Substance and Sexual Activity  . Alcohol use: No    Alcohol/week: 0.0 standard drinks  . Drug use: No  . Sexual activity: Yes    Partners: Male    Birth control/protection: Surgical  Other Topics Concern  . Not on file  Social History Narrative  . Not  on file   Social Determinants of Health   Financial Resource Strain: Low Risk   . Difficulty of Paying Living Expenses: Not hard at all  Food Insecurity: No Food Insecurity  . Worried About Charity fundraiser in the Last Year: Never true  . Ran Out of Food in the Last Year: Never true  Transportation Needs: No Transportation Needs  . Lack of Transportation (Medical): No  . Lack of Transportation (Non-Medical): No  Physical Activity: Insufficiently Active  . Days of Exercise per Week: 2 days  . Minutes of Exercise per Session: 30 min  Stress: No Stress Concern Present  . Feeling of Stress : Not at all  Social Connections: Socially Integrated  . Frequency of Communication with Friends and Family: More than three times a week  . Frequency of Social Gatherings with Friends and Family: More than three times a week  .  Attends Religious Services: More than 4 times per year  . Active Member of Clubs or Organizations: Yes  . Attends Archivist Meetings: More than 4 times per year  . Marital Status: Married  Human resources officer Violence: Not At Risk  . Fear of Current or Ex-Partner: No  . Emotionally Abused: No  . Physically Abused: No  . Sexually Abused: No    Current Outpatient Medications on File Prior to Visit  Medication Sig Dispense Refill  . Calcium Carbonate-Vitamin D 600-400 MG-UNIT per chew tablet Chew 1 tablet by mouth daily.    Marland Kitchen diltiazem (CARDIZEM CD) 180 MG 24 hr capsule TAKE 1 CAPSULE BY MOUTH EVERY DAY 90 capsule 0  . FIBER PO Take by mouth daily as needed.    . fluticasone (FLONASE) 50 MCG/ACT nasal spray Place 2 sprays into both nostrils daily. 16 g 6  . hydrochlorothiazide (MICROZIDE) 12.5 MG capsule TAKE 1 CAPSULE BY MOUTH EVERY DAY IN THE MORNING 90 capsule 1  . loratadine (CLARITIN) 10 MG tablet Take 10 mg by mouth daily.    . MULTIPLE VITAMIN PO Take 1 tablet by mouth daily.     No current facility-administered medications on file prior to visit.    Allergies  Allergen Reactions  . Ace Inhibitors   . Sulfamethoxazole-Trimethoprim Rash    Review of Systems Constitutional: negative for chills, fatigue, fevers and sweats. Positive for hot flushes (occasional).  Eyes: negative for irritation, redness and visual disturbance Ears, nose, mouth, throat, and face: negative for hearing loss, nasal congestion, snoring and tinnitus Respiratory: negative for asthma, cough, sputum Cardiovascular: negative for chest pain, dyspnea, exertional chest pressure/discomfort, irregular heart beat, palpitations and syncope Gastrointestinal: negative for abdominal pain, change in bowel habits, nausea and vomiting Genitourinary: positive for irregular menstrual periods (rarely having cycles).  Negative for genital lesions, sexual problems and vaginal discharge, dysuria and urinary  incontinence Integument/breast: negative for breast lump, breast tenderness and nipple discharge Hematologic/lymphatic: negative for bleeding and easy bruising Musculoskeletal:negative for back pain and muscle weakness Neurological: negative for dizziness, headaches, vertigo and weakness Endocrine: negative for diabetic symptoms including polydipsia, polyuria and skin dryness Allergic/Immunologic: negative for hay fever and urticaria      Objective:  Blood pressure 118/80, pulse 78, height 5' 1"  (1.549 m), weight 210 lb 9.6 oz (95.5 kg). Body mass index is 39.79 kg/m.   General Appearance:    Alert, cooperative, no distress, appears stated age, moderately obese.   Head:    Normocephalic, without obvious abnormality, atraumatic  Eyes:    PERRL, conjunctiva/corneas clear, EOM's intact, both eyes  Ears:    Normal external ear canals, both ears  Nose:   Nares normal, septum midline, mucosa normal, no drainage or sinus tenderness  Throat:   Lips, mucosa, and tongue normal; teeth and gums normal  Neck:   Supple, symmetrical, trachea midline, no adenopathy; thyroid: no enlargement/tenderness/nodules; no carotid bruit or JVD  Back:     Symmetric, no curvature, ROM normal, no CVA tenderness  Lungs:     Clear to auscultation bilaterally, respirations unlabored  Chest Wall:    No tenderness or deformity   Heart:    Regular rate and rhythm, S1 and S2 normal, no murmur, rub or gallop  Breast Exam:    No tenderness, masses, or nipple abnormality  Abdomen:     Soft, non-tender, bowel sounds active all four quadrants, no masses, no organomegaly.    Genitalia:    Pelvic:external genitalia normal, vagina without lesions, discharge, or tenderness, rectovaginal septum  normal. Cervix normal in appearance, no cervical motion tenderness, no adnexal masses or tenderness.  Uterus normal size, shape, mobile, regular contours, nontender.  Rectal:    Normal external sphincter.  No hemorrhoids appreciated. Internal  exam not done.   Extremities:   Extremities normal, atraumatic, no cyanosis or edema  Pulses:   2+ and symmetric all extremities  Skin:   Skin color, texture, turgor normal, no rashes or lesions  Lymph nodes:   Cervical, supraclavicular, and axillary nodes normal  Neurologic:   CNII-XII intact, normal strength, sensation and reflexes throughout   .  Labs:  Lab Results  Component Value Date   WBC 4.4 02/13/2019   HGB 15.8 (H) 02/13/2019   HCT 47.6 (H) 02/13/2019   MCV 84.7 02/13/2019   PLT 225 02/13/2019    Lab Results  Component Value Date   CREATININE 1.05 02/13/2019   BUN 20 02/13/2019   NA 137 02/13/2019   K 4.6 02/13/2019   CL 105 02/13/2019   CO2 19 (L) 02/13/2019    Lab Results  Component Value Date   ALT 17 02/13/2019   AST 21 02/13/2019   ALKPHOS 85 09/01/2018   BILITOT 0.6 02/13/2019    Lab Results  Component Value Date   CHOL 231 (H) 02/13/2019   HDL 52 02/13/2019   LDLCALC 155 (H) 02/13/2019   TRIG 120 02/13/2019   CHOLHDL 4.4 02/13/2019    No results found for: TSH  Lab Results  Component Value Date   HGBA1C 5.7 (H) 02/13/2019    Assessment:   Routine gynecologic exam.   H/o CIN I Benign HTN Hyperlipidemia Perimenopausal vasomotor symptoms Obesity (Class II) Pre-diabetes  Plan:    - Due for annual labs by PCP (notes she will have them done in the next week).  - Benign HTN, pre-diabetes, hyperlipidemia managed by PCP.  - Breast self exam technique reviewed and patient encouraged to perform self-exam monthly. - Contraception: tubal ligation. - Discussed healthy lifestyle modifications. Encouraged patient to continue healthy diet and continue walking multiple days of the week, which is her choice of exercise.   - Mammogram due in October, ordered today.  - Colonoscopy up to date (01/2016) . - Pap smear performed today for h/o abnormal pap smears and CIN I on coloposcopy.  If results are normal, next pap smear will be due in 3 years.  -  Perimenopausal status - reiterated diagnoses and expectations in upcoming years. Patient declined any hormonal menopausal medications at this time since her symptoms are manageable.  Recently initiated black cohosh.  -  COVID vaccination status: Has completed Pfizer 2 dose vaccine series (see Epic for documentation).  - Follow up in 1 year.   I have seen and examined the patient with April Greissenger, [place] PA-S.  I have reviewed the record and concur with patient management and plan.   Rubie Maid, MD [facility]    Rubie Maid, MD [facility]

## 2020-03-10 NOTE — Patient Instructions (Signed)
Preventive Care 40-54 Years Old, Female Preventive care refers to visits with your health care provider and lifestyle choices that can promote health and wellness. This includes:  A yearly physical exam. This may also be called an annual well check.  Regular dental visits and eye exams.  Immunizations.  Screening for certain conditions.  Healthy lifestyle choices, such as eating a healthy diet, getting regular exercise, not using drugs or products that contain nicotine and tobacco, and limiting alcohol use. What can I expect for my preventive care visit? Physical exam Your health care provider will check your:  Height and weight. This may be used to calculate body mass index (BMI), which tells if you are at a healthy weight.  Heart rate and blood pressure.  Skin for abnormal spots. Counseling Your health care provider may ask you questions about your:  Alcohol, tobacco, and drug use.  Emotional well-being.  Home and relationship well-being.  Sexual activity.  Eating habits.  Work and work environment.  Method of birth control.  Menstrual cycle.  Pregnancy history. What immunizations do I need?  Influenza (flu) vaccine  This is recommended every year. Tetanus, diphtheria, and pertussis (Tdap) vaccine  You may need a Td booster every 10 years. Varicella (chickenpox) vaccine  You may need this if you have not been vaccinated. Zoster (shingles) vaccine  You may need this after age 60. Measles, mumps, and rubella (MMR) vaccine  You may need at least one dose of MMR if you were born in 1957 or later. You may also need a second dose. Pneumococcal conjugate (PCV13) vaccine  You may need this if you have certain conditions and were not previously vaccinated. Pneumococcal polysaccharide (PPSV23) vaccine  You may need one or two doses if you smoke cigarettes or if you have certain conditions. Meningococcal conjugate (MenACWY) vaccine  You may need this if you  have certain conditions. Hepatitis A vaccine  You may need this if you have certain conditions or if you travel or work in places where you may be exposed to hepatitis A. Hepatitis B vaccine  You may need this if you have certain conditions or if you travel or work in places where you may be exposed to hepatitis B. Haemophilus influenzae type b (Hib) vaccine  You may need this if you have certain conditions. Human papillomavirus (HPV) vaccine  If recommended by your health care provider, you may need three doses over 6 months. You may receive vaccines as individual doses or as more than one vaccine together in one shot (combination vaccines). Talk with your health care provider about the risks and benefits of combination vaccines. What tests do I need? Blood tests  Lipid and cholesterol levels. These may be checked every 5 years, or more frequently if you are over 50 years old.  Hepatitis C test.  Hepatitis B test. Screening  Lung cancer screening. You may have this screening every year starting at age 55 if you have a 30-pack-year history of smoking and currently smoke or have quit within the past 15 years.  Colorectal cancer screening. All adults should have this screening starting at age 50 and continuing until age 75. Your health care provider may recommend screening at age 45 if you are at increased risk. You will have tests every 1-10 years, depending on your results and the type of screening test.  Diabetes screening. This is done by checking your blood sugar (glucose) after you have not eaten for a while (fasting). You may have this   done every 1-3 years.  Mammogram. This may be done every 1-2 years. Talk with your health care provider about when you should start having regular mammograms. This may depend on whether you have a family history of breast cancer.  BRCA-related cancer screening. This may be done if you have a family history of breast, ovarian, tubal, or peritoneal  cancers.  Pelvic exam and Pap test. This may be done every 3 years starting at age 60. Starting at age 7, this may be done every 5 years if you have a Pap test in combination with an HPV test. Other tests  Sexually transmitted disease (STD) testing.  Bone density scan. This is done to screen for osteoporosis. You may have this scan if you are at high risk for osteoporosis. Follow these instructions at home: Eating and drinking  Eat a diet that includes fresh fruits and vegetables, whole grains, lean protein, and low-fat dairy.  Take vitamin and mineral supplements as recommended by your health care provider.  Do not drink alcohol if: ? Your health care provider tells you not to drink. ? You are pregnant, may be pregnant, or are planning to become pregnant.  If you drink alcohol: ? Limit how much you have to 0-1 drink a day. ? Be aware of how much alcohol is in your drink. In the [place] one drink equals one 12 oz bottle of beer (355 mL), one 5 oz glass of wine (148 mL), or one 1 oz glass of hard liquor (44 mL). Lifestyle  Take daily care of your teeth and gums.  Stay active. Exercise for at least 30 minutes on 5 or more days each week.  Do not use any products that contain nicotine or tobacco, such as cigarettes, e-cigarettes, and chewing tobacco. If you need help quitting, ask your health care provider.  If you are sexually active, practice safe sex. Use a condom or other form of birth control (contraception) in order to prevent pregnancy and STIs (sexually transmitted infections).  If told by your health care provider, take low-dose aspirin daily starting at age 48. What's next?  Visit your health care provider once a year for a well check visit.  Ask your health care provider how often you should have your eyes and teeth checked.  Stay up to date on all vaccines. This information is not intended to replace advice given to you by your health care provider. Make sure you  discuss any questions you have with your health care provider. Document Revised: 03/20/2018 Document Reviewed: 03/20/2018 Elsevier Patient Education  2020 [place] Breast self-awareness is knowing how your breasts look and feel. Doing breast self-awareness is important. It allows you to catch a breast problem early while it is still small and can be treated. All women should do breast self-awareness, including women who have had breast implants. Tell your doctor if you notice a change in your breasts. What you need:  A mirror.  A well-lit room. How to do a breast self-exam A breast self-exam is one way to learn what is normal for your breasts and to check for changes. To do a breast self-exam: Look for changes  1. Take off all the clothes above your waist. 2. Stand in front of a mirror in a room with good lighting. 3. Put your hands on your hips. 4. Push your hands down. 5. Look at your breasts and nipples in the mirror to see if one breast or nipple looks different from the  other. Check to see if: ? The shape of one breast is different. ? The size of one breast is different. ? There are wrinkles, dips, and bumps in one breast and not the other. 6. Look at each breast for changes in the skin, such as: ? Redness. ? Scaly areas. 7. Look for changes in your nipples, such as: ? Liquid around the nipples. ? Bleeding. ? Dimpling. ? Redness. ? A change in where the nipples are. Feel for changes  1. Lie on your back on the floor. 2. Feel each breast. To do this, follow these steps: ? Pick a breast to feel. ? Put the arm closest to that breast above your head. ? Use your other arm to feel the nipple area of your breast. Feel the area with the pads of your three middle fingers by making small circles with your fingers. For the first circle, press lightly. For the second circle, press harder. For the third circle, press even harder. ? Keep making circles with  your fingers at the different pressures as you move down your breast. Stop when you feel your ribs. ? Move your fingers a little toward the center of your body. ? Start making circles with your fingers again, this time going up until you reach your collarbone. ? Keep making up-and-down circles until you reach your armpit. Remember to keep using the three pressures. ? Feel the other breast in the same way. 3. Sit or stand in the tub or shower. 4. With soapy water on your skin, feel each breast the same way you did in step 2 when you were lying on the floor. Write down what you find Writing down what you find can help you remember what to tell your doctor. Write down:  What is normal for each breast.  Any changes you find in each breast, including: ? The kind of changes you find. ? Whether you have pain. ? Size and location of any lumps.  When you last had your menstrual period. General tips  Check your breasts every month.  If you are breastfeeding, the best time to check your breasts is after you feed your baby or after you use a breast pump.  If you get menstrual periods, the best time to check your breasts is 5-7 days after your menstrual period is over.  With time, you will become comfortable with the self-exam, and you will begin to know if there are changes in your breasts. Contact a doctor if you:  See a change in the shape or size of your breasts or nipples.  See a change in the skin of your breast or nipples, such as red or scaly skin.  Have fluid coming from your nipples that is not normal.  Find a lump or thick area that was not there before.  Have pain in your breasts.  Have any concerns about your breast health. Summary  Breast self-awareness includes looking for changes in your breasts, as well as feeling for changes within your breasts.  Breast self-awareness should be done in front of a mirror in a well-lit room.  You should check your breasts every month.  If you get menstrual periods, the best time to check your breasts is 5-7 days after your menstrual period is over.  Let your doctor know of any changes you see in your breasts, including changes in size, changes on the skin, pain or tenderness, or fluid from your nipples that is not normal. This information is not  intended to replace advice given to you by your health care provider. Make sure you discuss any questions you have with your health care provider. Document Revised: 02/25/2018 Document Reviewed: 02/25/2018 Elsevier Patient Education  [place].

## 2020-03-17 LAB — CYTOLOGY - PAP
Adequacy: ABSENT
Comment: NEGATIVE
Comment: NEGATIVE
Diagnosis: NEGATIVE
HPV 16: NEGATIVE
HPV 18 / 45: NEGATIVE
High risk HPV: POSITIVE — AB

## 2020-03-18 ENCOUNTER — Ambulatory Visit: Payer: BC Managed Care – PPO | Admitting: Family Medicine

## 2020-05-04 ENCOUNTER — Other Ambulatory Visit: Payer: Self-pay | Admitting: Family Medicine

## 2020-05-04 DIAGNOSIS — I10 Essential (primary) hypertension: Secondary | ICD-10-CM

## 2020-05-19 ENCOUNTER — Other Ambulatory Visit: Payer: Self-pay

## 2020-05-19 ENCOUNTER — Ambulatory Visit
Admission: RE | Admit: 2020-05-19 | Discharge: 2020-05-19 | Disposition: A | Discharge status: Home / Self Care | Payer: BC Managed Care – PPO | Source: Ambulatory Visit | Attending: Obstetrics and Gynecology | Admitting: Obstetrics and Gynecology

## 2020-05-19 DIAGNOSIS — Z1231 Encounter for screening mammogram for malignant neoplasm of breast: Secondary | ICD-10-CM | POA: Diagnosis present

## 2020-07-27 ENCOUNTER — Telehealth: Payer: Self-pay | Admitting: Family Medicine

## 2020-07-27 DIAGNOSIS — I10 Essential (primary) hypertension: Secondary | ICD-10-CM

## 2020-08-10 ENCOUNTER — Other Ambulatory Visit: Payer: Self-pay | Admitting: Family Medicine

## 2020-08-10 DIAGNOSIS — I10 Essential (primary) hypertension: Secondary | ICD-10-CM

## 2020-08-11 NOTE — Progress Notes (Signed)
Name: Karen Donaldson   MRN: [id number]    DOB: [birth year]   Date:08/15/2020       Progress Note  Subjective  Chief Complaint  Follow up   HPI   HTN: patient states she has been compliant with her medication, no chest pain, no palpitation, noedema.BP athome is within normal limits, bp today   History of colitis: seen at [facility], had CT abdomen 08/2018 and showed left side colitis, per Dr. Durwin Reges likely ischemic colitis. She is doing well at this time. No recurrence since first incident   Chronic constipation: BM's have been regular - she has a bowel movement about 2-3 times a week, [place] is now between 2-3 . She is taking Miralax prn , no blood or mucus in stools.   HistoryCIN I with HPV: seesDr. Leeanne Rio colposcopyin 03/2015, repeat pap smear was normal 02/2017, but positive HPV, she had endocervical curettage and was negative, she went back 02/2018 and had same findings, and biopsy also negative, repeat pap smear 02/2019 was also normal, 03/2020 normal pap smear , HPV positive for not high genotypes   AR:she alternates between zyrtec and loratadine , she also use saline washes, some nasal congestion, but denies rhinorrhea, sneezing or cough   Hyperlipidemia: she is not taking medication. Explained since ischemic colitis she may re-consider taking statin therapy  but she wants to hold off for now   The 10-year ASCVD risk score Mikey Bussing DC Brooke Bonito., et al., 2013) is: 6.9%   Values used to calculate the score:     Age: 55 years     Sex: Female     Is Non-Hispanic African American: Yes     Diabetic: No     Tobacco smoker: No     Systolic Blood Pressure: 409 mmHg     Is BP treated: Yes     HDL Cholesterol: 52 mg/dL     Total Cholesterol: 231 mg/dL  Pre-diabetes: she denies polyphagia, polyuria or polydipsia.Last hgbA1C 5.7%one year ago, she has family history of DM ( Mother ). We will recheck labs   Obesity:weight is stable, she is exercising three times a week for 30  minutes , advised to increase to 5 days a week. She states trying to increase fruit in her diet , drinking more water    Patient Active Problem List   Diagnosis Date Noted  . History of ischemic colitis 08/13/2019  . CIN I (cervical intraepithelial neoplasia I) 02/22/2015  . Benign essential HTN 02/06/2015  . Dyslipidemia 02/06/2015  . Blood glucose elevated 02/06/2015  . Obesity (BMI 30-39.9) 02/06/2015  . Perennial allergic rhinitis with seasonal variation 02/06/2015  . Stress incontinence 02/06/2015  . Vitamin D deficiency 02/06/2015    Past Surgical History:  Procedure Laterality Date  . CESAREAN SECTION  2009, 2012  . COLONOSCOPY WITH PROPOFOL N/A 02/03/2016   Procedure: COLONOSCOPY WITH PROPOFOL;  Surgeon: Lucilla Lame, MD;  Location: [place];  Service: Endoscopy;  Laterality: N/A;  . TUBAL LIGATION      Family History  Problem Relation Age of Onset  . Hypertension Mother   . Diabetes Mother   . Dementia Mother   . Hypertension Father   . Breast cancer Maternal Aunt   . Ovarian cancer Neg Hx   . Colon cancer Neg Hx   . Heart disease Neg Hx     Social History   Tobacco Use  . Smoking status: Never Smoker  . Smokeless tobacco: Never Used  Substance Use Topics  .  Alcohol use: No    Alcohol/week: 0.0 standard drinks     Current Outpatient Medications:  .  Calcium Carbonate-Vitamin D 600-400 MG-UNIT per chew tablet, Chew 1 tablet by mouth daily., Disp: , Rfl:  .  diltiazem (CARDIZEM CD) 180 MG 24 hr capsule, TAKE 1 CAPSULE BY MOUTH EVERY DAY, Disp: 30 capsule, Rfl: 0 .  FIBER PO, Take by mouth daily as needed., Disp: , Rfl:  .  fluticasone (FLONASE) 50 MCG/ACT nasal spray, Place 2 sprays into both nostrils daily., Disp: 16 g, Rfl: 6 .  hydrochlorothiazide (MICROZIDE) 12.5 MG capsule, TAKE 1 CAPSULE BY MOUTH EVERY DAY IN THE MORNING, Disp: 30 capsule, Rfl: 0 .  loratadine (CLARITIN) 10 MG tablet, Take 10 mg by mouth daily., Disp: , Rfl:  .  MULTIPLE  VITAMIN PO, Take 1 tablet by mouth daily., Disp: , Rfl:   Allergies  Allergen Reactions  . Ace Inhibitors   . Sulfamethoxazole-Trimethoprim Rash  . Sulfur Rash    I personally reviewed active problem list, medication list, allergies, family history, social history, health maintenance, notes from last encounter with the patient/caregiver today.   ROS  Constitutional: Negative for fever or weight change.  Respiratory: Negative for cough and shortness of breath.   Cardiovascular: Negative for chest pain or palpitations.  Gastrointestinal: Negative for abdominal pain, no bowel changes.  Musculoskeletal: Negative for gait problem or joint swelling.  Skin: Negative for rash.  Neurological: Negative for dizziness or headache.  No other specific complaints in a complete review of systems (except as listed in HPI above).  Objective  Vitals:   08/15/20 1552  BP: 136/82  Pulse: 84  Resp: 16  Temp: 97.8 F (36.6 C)  TempSrc: Oral  SpO2: 96%  Weight: 206 lb 6.4 oz (93.6 kg)  Height: 5' 1"  (1.549 m)    Body mass index is 39 kg/m.  Physical Exam  Constitutional: Patient appears well-developed and well-nourished. Obese No distress.  HEENT: head atraumatic, normocephalic, pupils equal and reactive to light,neck supple Cardiovascular: Normal rate, regular rhythm and normal heart sounds.  No murmur heard. No BLE edema. Pulmonary/Chest: Effort normal and breath sounds normal. No respiratory distress. Abdominal: Soft.  There is no tenderness. Psychiatric: Patient has a normal mood and affect. behavior is normal. Judgment and thought content normal.   PHQ2/9: Depression screen [facility] 2/9 08/15/2020 08/13/2019 02/13/2019 08/15/2018 02/12/2018  Decreased Interest 0 0 0 0 0  Down, Depressed, Hopeless 0 0 0 0 0  PHQ - 2 Score 0 0 0 0 0  Altered sleeping - 0 0 - 0  Tired, decreased energy - 1 0 - 0  Change in appetite - 0 0 - 0  Feeling bad or failure about yourself  - 0 0 - 0  Trouble  concentrating - 0 0 - 0  Moving slowly or fidgety/restless - 0 0 - -  Suicidal thoughts - 0 0 - 0  PHQ-9 Score - 1 0 - 0  Difficult doing work/chores - Not difficult at all Not difficult at all - -    phq 9 is negative   Fall Risk: Fall Risk  08/15/2020 08/13/2019 02/13/2019 08/15/2018 05/30/2018  Falls in the past year? 0 0 0 0 0  Number falls in past yr: 0 0 0 0 -  Injury with Fall? 0 0 0 0 -     Functional Status Survey: Is the patient deaf or have difficulty hearing?: No Does the patient have difficulty seeing, even when wearing glasses/contacts?: No  Does the patient have difficulty concentrating, remembering, or making decisions?: No Does the patient have difficulty walking or climbing stairs?: No Does the patient have difficulty dressing or bathing?: No Does the patient have difficulty doing errands alone such as visiting a doctor's office or shopping?: No    Assessment & Plan  1. Benign essential HTN  - CBC with Differential/Platelet - COMPLETE METABOLIC PANEL WITH GFR - diltiazem (CARDIZEM CD) 180 MG 24 hr capsule; Take 1 capsule (180 mg total) by mouth daily.  Dispense: 90 capsule; Refill: 1 - hydrochlorothiazide (MICROZIDE) 12.5 MG capsule; Take 1 capsule (12.5 mg total) by mouth daily.  Dispense: 90 capsule; Refill: 1  2. Obesity (BMI 30-39.9)   3. Pre-diabetes  - Hemoglobin A1c  4. Dyslipidemia  - Lipid panel  5. Need for Tdap vaccination  - Tdap vaccine greater than or equal to 7yo IM  6. Vitamin D deficiency  - VITAMIN D 25 Hydroxy (Vit-D Deficiency, Fractures)  7. Need for immunization against influenza  - Flu Vaccine QUAD 36+ mos IM  8. Chronic constipation   9. Perennial allergic rhinitis with seasonal variation   10. History of ischemic colitis   11. Need for hepatitis C screening test  - Hepatitis C antibody

## 2020-08-15 ENCOUNTER — Other Ambulatory Visit: Payer: Self-pay

## 2020-08-15 ENCOUNTER — Ambulatory Visit: Payer: BC Managed Care – PPO | Admitting: Family Medicine

## 2020-08-15 ENCOUNTER — Encounter: Payer: Self-pay | Admitting: Family Medicine

## 2020-08-15 VITALS — BP 136/82 | HR 84 | Temp 97.8°F | Resp 16 | Ht 61.0 in | Wt 206.4 lb

## 2020-08-15 DIAGNOSIS — Z8719 Personal history of other diseases of the digestive system: Secondary | ICD-10-CM

## 2020-08-15 DIAGNOSIS — I10 Essential (primary) hypertension: Secondary | ICD-10-CM

## 2020-08-15 DIAGNOSIS — R7303 Prediabetes: Secondary | ICD-10-CM

## 2020-08-15 DIAGNOSIS — E785 Hyperlipidemia, unspecified: Secondary | ICD-10-CM | POA: Diagnosis not present

## 2020-08-15 DIAGNOSIS — Z23 Encounter for immunization: Secondary | ICD-10-CM

## 2020-08-15 DIAGNOSIS — J302 Other seasonal allergic rhinitis: Secondary | ICD-10-CM

## 2020-08-15 DIAGNOSIS — E559 Vitamin D deficiency, unspecified: Secondary | ICD-10-CM

## 2020-08-15 DIAGNOSIS — E669 Obesity, unspecified: Secondary | ICD-10-CM

## 2020-08-15 DIAGNOSIS — Z1159 Encounter for screening for other viral diseases: Secondary | ICD-10-CM

## 2020-08-15 DIAGNOSIS — K5909 Other constipation: Secondary | ICD-10-CM

## 2020-08-15 DIAGNOSIS — J3089 Other allergic rhinitis: Secondary | ICD-10-CM

## 2020-08-15 MED ORDER — DILTIAZEM HCL ER COATED BEADS 180 MG PO CP24: 180.0000 mg | ORAL_CAPSULE | Freq: Every day | ORAL | 1 refills | Status: DC

## 2020-08-15 MED ORDER — HYDROCHLOROTHIAZIDE 12.5 MG PO CAPS: 12.5000 mg | ORAL_CAPSULE | Freq: Every day | ORAL | 1 refills | Status: DC

## 2020-08-16 LAB — CBC WITH DIFFERENTIAL/PLATELET
Absolute Monocytes: 448 cells/uL (ref 200–950)
Basophils Absolute: 28 cells/uL (ref 0–200)
Basophils Relative: 0.4 %
Eosinophils Absolute: 119 cells/uL (ref 15–500)
Eosinophils Relative: 1.7 %
HCT: 46.3 % — ABNORMAL HIGH (ref 35.0–45.0)
Hemoglobin: 15.8 g/dL — ABNORMAL HIGH (ref 11.7–15.5)
Lymphs Abs: 2492 cells/uL (ref 850–3900)
MCH: 28.2 pg (ref 27.0–33.0)
MCHC: 34.1 g/dL (ref 32.0–36.0)
MCV: 82.5 fL (ref 80.0–100.0)
MPV: 11.7 fL (ref 7.5–12.5)
Monocytes Relative: 6.4 %
Neutro Abs: 3913 cells/uL (ref 1500–7800)
Neutrophils Relative %: 55.9 %
Platelets: 264 10*3/uL (ref 140–400)
RBC: 5.61 10*6/uL — ABNORMAL HIGH (ref 3.80–5.10)
RDW: 13.4 % (ref 11.0–15.0)
Total Lymphocyte: 35.6 %
WBC: 7 10*3/uL (ref 3.8–10.8)

## 2020-08-16 LAB — COMPLETE METABOLIC PANEL WITH GFR
AG Ratio: 1.6 (calc) (ref 1.0–2.5)
ALT: 22 U/L (ref 6–29)
AST: 19 U/L (ref 10–35)
Albumin: 4.5 g/dL (ref 3.6–5.1)
Alkaline phosphatase (APISO): 88 U/L (ref 37–153)
BUN: 19 mg/dL (ref 7–25)
CO2: 29 mmol/L (ref 20–32)
Calcium: 10.2 mg/dL (ref 8.6–10.4)
Chloride: 103 mmol/L (ref 98–110)
Creat: 0.91 mg/dL (ref 0.50–1.05)
GFR, Est African American: 83 mL/min/{1.73_m2} (ref >=60)
GFR, Est Non African American: 72 mL/min/{1.73_m2} (ref >=60)
Globulin: 2.8 g/dL (calc) (ref 1.9–3.7)
Glucose, Bld: 92 mg/dL (ref 65–99)
Potassium: 4.3 mmol/L (ref 3.5–5.3)
Sodium: 141 mmol/L (ref 135–146)
Total Bilirubin: 0.4 mg/dL (ref 0.2–1.2)
Total Protein: 7.3 g/dL (ref 6.1–8.1)

## 2020-08-16 LAB — LIPID PANEL
Cholesterol: 228 mg/dL — ABNORMAL HIGH (ref <200)
HDL: 53 mg/dL (ref >=50)
LDL Cholesterol (Calc): 148 mg/dL (calc) — ABNORMAL HIGH
Non-HDL Cholesterol (Calc): 175 mg/dL (calc) — ABNORMAL HIGH (ref <130)
Total CHOL/HDL Ratio: 4.3 (calc) (ref <5.0)
Triglycerides: 148 mg/dL (ref <150)

## 2020-08-16 LAB — HEPATITIS C ANTIBODY
Hepatitis C Ab: NONREACTIVE
SIGNAL TO CUT-OFF: 0.01 (ref <1.00)

## 2020-08-16 LAB — HEMOGLOBIN A1C
Hgb A1c MFr Bld: 6 % of total Hgb — ABNORMAL HIGH (ref <5.7)
Mean Plasma Glucose: 126 mg/dL
eAG (mmol/L): 7 mmol/L

## 2020-08-16 LAB — VITAMIN D 25 HYDROXY (VIT D DEFICIENCY, FRACTURES): Vit D, 25-Hydroxy: 37 ng/mL (ref 30–100)

## 2020-08-17 ENCOUNTER — Encounter: Payer: Self-pay | Admitting: Family Medicine

## 2020-08-17 DIAGNOSIS — R7303 Prediabetes: Secondary | ICD-10-CM | POA: Insufficient documentation

## 2020-12-21 ENCOUNTER — Encounter: Payer: Self-pay | Admitting: Family Medicine

## 2021-01-04 NOTE — Progress Notes (Signed)
Name: Karen Donaldson   MRN: [id number]    DOB: 02-Jun-1966   Date:01/05/2021       Progress Note  Subjective  Chief Complaint  Follow up/Paperwork completion  HPI  HTN: patient states she has been compliant with her medication, no chest pain, no palpitation, no edema.  BP is towards low end of normal.    History of colitis: seen at [facility], had CT abdomen 08/2018 and showed left side colitis, per Dr. Durwin Reges likely ischemic colitis. She is doing well at this time. No recurrence since first incident Unchanged    Chronic constipation:  BM's have been  regular - she has a bowel movement about 2-3 times a week, [place] is now between 3-4 . She is taking Miralax prn , no blood or mucus in stools. 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 Recheck durng her next CPE   AR:she alternates between zyrtec and loratadine , she also use saline washes, some nasal congestion, but denies rhinorrhea, sneezing . She has noticed a dry cough again, advised to resume singulair    Hyperlipidemia: she is not taking medication. Explained since ischemic colitis she may re-consider taking statin therapy  but she wants to hold off for now   The 10-year ASCVD risk score Mikey Bussing DC Brooke Bonito., et al., 2013) is: 3.4%   Values used to calculate the score:     Age: 55 years     Sex: Female     Is Non-Hispanic African American: Yes     Diabetic: No     Tobacco smoker: No     Systolic Blood Pressure: 195 mmHg     Is BP treated: Yes     HDL Cholesterol: 53 mg/dL     Total Cholesterol: 228 mg/dL    Pre-diabetes: she denies polyphagia,  polyuria or polydipsia. Last hgbA1C went up to 6 % she has family history of DM ( Mother ). Discussed diabetic diet again   Obesity: weight is stable, she is exercising three times a week for 30 minutes , advised to increase to 5 days a week. She states trying to increase fruit in her diet , drinking more water     Patient Active Problem List   Diagnosis Date Noted   Pre-diabetes 08/17/2020   History of ischemic colitis 08/13/2019   CIN I (cervical intraepithelial neoplasia I) 02/22/2015   Benign essential HTN 02/06/2015   Dyslipidemia 02/06/2015   Blood glucose elevated 02/06/2015   Obesity (BMI 30-39.9) 02/06/2015   Perennial allergic rhinitis with seasonal variation 02/06/2015   Stress incontinence 02/06/2015   Vitamin D deficiency 02/06/2015    Past Surgical History:  Procedure Laterality Date   CESAREAN SECTION  2009, 2012   COLONOSCOPY WITH PROPOFOL N/A 02/03/2016   Procedure: COLONOSCOPY WITH PROPOFOL;  Surgeon: Lucilla Lame, MD;  Location: [place];  Service: Endoscopy;  Laterality: N/A;   TUBAL LIGATION      Family History  Problem Relation Age of Onset   Hypertension Mother    Diabetes Mother    Dementia Mother    Hypertension Father  Breast cancer Maternal Aunt    Ovarian cancer Neg Hx    Colon cancer Neg Hx    Heart disease Neg Hx     Social History   Tobacco Use   Smoking status: Never   Smokeless tobacco: Never  Substance Use Topics   Alcohol use: No    Alcohol/week: 0.0 standard drinks     Current Outpatient Medications:    Calcium Carbonate-Vitamin D 600-400 MG-UNIT per chew tablet, Chew 1 tablet by mouth daily., Disp: , Rfl:    diltiazem (CARDIZEM CD) 180 MG 24 hr capsule, Take 1 capsule (180 mg total) by mouth daily., Disp: 90 capsule, Rfl: 1   FIBER PO, Take by mouth daily as needed., Disp: , Rfl:    fluticasone (FLONASE) 50 MCG/ACT nasal spray, Place 2 sprays into both nostrils daily., Disp: 16 g, Rfl: 6   hydrochlorothiazide  (MICROZIDE) 12.5 MG capsule, Take 1 capsule (12.5 mg total) by mouth daily., Disp: 90 capsule, Rfl: 1   loratadine (CLARITIN) 10 MG tablet, Take 10 mg by mouth daily., Disp: , Rfl:    MULTIPLE VITAMIN PO, Take 1 tablet by mouth daily., Disp: , Rfl:   Allergies  Allergen Reactions   Ace Inhibitors    Sulfamethoxazole-Trimethoprim Rash   Sulfur Rash    I personally reviewed active problem list, medication list, allergies, family history, social history, health maintenance with the patient/caregiver today.   ROS  Constitutional: Negative for fever or weight change.  Respiratory: Negative for cough and shortness of breath.   Cardiovascular: Negative for chest pain or palpitations.  Gastrointestinal: Negative for abdominal pain, no bowel changes.  Musculoskeletal: Negative for gait problem or joint swelling.  Skin: Negative for rash.  Neurological: Negative for dizziness or headache.  No other specific complaints in a complete review of systems (except as listed in HPI above).   Objective  Vitals:   01/05/21 0745  BP: 112/76  Pulse: 86  Resp: 16  Temp: 97.7 F (36.5 C)  TempSrc: Oral  SpO2: 98%  Weight: 203 lb (92.1 kg)  Height: 5' 1"  (1.549 m)    Body mass index is 38.36 kg/m.  Physical Exam  Constitutional: Patient appears well-developed and well-nourished. Obese  No distress.  HEENT: head atraumatic, normocephalic, pupils equal and reactive to light, neck supple. Cardiovascular: Normal rate, regular rhythm and normal heart sounds.  No murmur heard. No BLE edema. Pulmonary/Chest: Effort normal and breath sounds normal. No respiratory distress. Abdominal: Soft.  There is no tenderness. Psychiatric: Patient has a normal mood and affect. behavior is normal. Judgment and thought content normal.   PHQ2/9: Depression screen [facility] 2/9 01/05/2021 08/15/2020 08/13/2019 02/13/2019 08/15/2018  Decreased Interest 0 0 0 0 0  Down, Depressed, Hopeless 0 0 0 0 0  PHQ - 2 Score 0 0 0 0  0  Altered sleeping - - 0 0 -  Tired, decreased energy - - 1 0 -  Change in appetite - - 0 0 -  Feeling bad or failure about yourself  - - 0 0 -  Trouble concentrating - - 0 0 -  Moving slowly or fidgety/restless - - 0 0 -  Suicidal thoughts - - 0 0 -  PHQ-9 Score - - 1 0 -  Difficult doing work/chores - - Not difficult at all Not difficult at all -    phq 9 is negative  Fall Risk: Fall Risk  01/05/2021 08/15/2020 08/13/2019 02/13/2019 08/15/2018  Falls in the past year? 0 0 0 0  0  Number falls in past yr: 0 0 0 0 0  Injury with Fall? 0 0 0 0 0     Functional Status Survey: Is the patient deaf or have difficulty hearing?: No Does the patient have difficulty seeing, even when wearing glasses/contacts?: No Does the patient have difficulty concentrating, remembering, or making decisions?: No Does the patient have difficulty walking or climbing stairs?: No Does the patient have difficulty dressing or bathing?: No Does the patient have difficulty doing errands alone such as visiting a doctor's office or shopping?: No    Assessment & Plan  1. Benign essential HTN  - diltiazem (CARDIZEM CD) 180 MG 24 hr capsule; Take 1 capsule (180 mg total) by mouth daily.  Dispense: 90 capsule; Refill: 1 - hydrochlorothiazide (MICROZIDE) 12.5 MG capsule; Take 1 capsule (12.5 mg total) by mouth daily.  Dispense: 90 capsule; Refill: 1  2. Dyslipidemia   3. Pre-diabetes  Discussed diet   4. Vitamin D deficiency  Continue vitamin D supplementation   5. Perennial allergic rhinitis with seasonal variation   6. Obesity (BMI 30-39.9)  Discussed with the patient the risk posed by an increased BMI. Discussed importance of portion control, calorie counting and at least 150 minutes of physical activity weekly. Avoid sweet beverages and drink more water. Eat at least 6 servings of fruit and vegetables daily    7. History of ischemic colitis   8. Chronic constipation  Stable   9. Need for  shingles vaccine  - Varicella-zoster vaccine IM (Shingrix)

## 2021-01-05 ENCOUNTER — Encounter: Payer: Self-pay | Admitting: Family Medicine

## 2021-01-05 ENCOUNTER — Ambulatory Visit: Payer: BC Managed Care – PPO | Admitting: Family Medicine

## 2021-01-05 ENCOUNTER — Other Ambulatory Visit: Payer: Self-pay

## 2021-01-05 VITALS — BP 112/76 | HR 86 | Temp 97.7°F | Resp 16 | Ht 61.0 in | Wt 203.0 lb

## 2021-01-05 DIAGNOSIS — Z111 Encounter for screening for respiratory tuberculosis: Secondary | ICD-10-CM

## 2021-01-05 DIAGNOSIS — E785 Hyperlipidemia, unspecified: Secondary | ICD-10-CM | POA: Diagnosis not present

## 2021-01-05 DIAGNOSIS — E559 Vitamin D deficiency, unspecified: Secondary | ICD-10-CM

## 2021-01-05 DIAGNOSIS — Z23 Encounter for immunization: Secondary | ICD-10-CM

## 2021-01-05 DIAGNOSIS — K5909 Other constipation: Secondary | ICD-10-CM

## 2021-01-05 DIAGNOSIS — I10 Essential (primary) hypertension: Secondary | ICD-10-CM

## 2021-01-05 DIAGNOSIS — R7303 Prediabetes: Secondary | ICD-10-CM | POA: Diagnosis not present

## 2021-01-05 DIAGNOSIS — J3089 Other allergic rhinitis: Secondary | ICD-10-CM

## 2021-01-05 DIAGNOSIS — Z8719 Personal history of other diseases of the digestive system: Secondary | ICD-10-CM

## 2021-01-05 DIAGNOSIS — E669 Obesity, unspecified: Secondary | ICD-10-CM

## 2021-01-05 DIAGNOSIS — J302 Other seasonal allergic rhinitis: Secondary | ICD-10-CM

## 2021-01-05 MED ORDER — DILTIAZEM HCL ER COATED BEADS 180 MG PO CP24
180.0000 mg | ORAL_CAPSULE | Freq: Every day | ORAL | 1 refills | Status: DC
Start: 2021-01-05 — End: 2021-08-16

## 2021-01-05 MED ORDER — HYDROCHLOROTHIAZIDE 12.5 MG PO CAPS: 12.5000 mg | ORAL_CAPSULE | Freq: Every day | ORAL | 1 refills | Status: DC

## 2021-01-09 ENCOUNTER — Other Ambulatory Visit: Payer: Self-pay | Admitting: Family Medicine

## 2021-01-09 DIAGNOSIS — J3089 Other allergic rhinitis: Secondary | ICD-10-CM

## 2021-02-07 LAB — QUANTIFERON-TB GOLD PLUS
Mitogen-NIL: >10 IU/mL
NIL: 0.02 IU/mL
QuantiFERON-TB Gold Plus: NEGATIVE
TB1-NIL: 0 IU/mL
TB2-NIL: 0 IU/mL

## 2021-03-13 NOTE — Patient Instructions (Signed)
Breast Self-Awareness Breast self-awareness is knowing how your breasts look and feel. Doing breast self-awareness is important. It allows you to catch a breast problem early while it is still small and can be treated. All women should do breast self-awareness, including women who have had breast implants. Tell your doctorif you notice a change in your breasts. What you need: A mirror. A well-lit room. How to do a breast self-exam A breast self-exam is one way to learn what is normal for your breasts and tocheck for changes. To do a breast self-exam: Look for changes  Take off all the clothes above your waist. Stand in front of a mirror in a room with good lighting. Put your hands on your hips. Push your hands down. Look at your breasts and nipples in the mirror to see if one breast or nipple looks different from the other. Check to see if: The shape of one breast is different. The size of one breast is different. There are wrinkles, dips, and bumps in one breast and not the other. Look at each breast for changes in the skin, such as: Redness. Scaly areas. Look for changes in your nipples, such as: Liquid around the nipples. Bleeding. Dimpling. Redness. A change in where the nipples are.  Feel for changes  Lie on your back on the floor. Feel each breast. To do this, follow these steps: Pick a breast to feel. Put the arm closest to that breast above your head. Use your other arm to feel the nipple area of your breast. Feel the area with the pads of your three middle fingers by making small circles with your fingers. For the first circle, press lightly. For the second circle, press harder. For the third circle, press even harder. Keep making circles with your fingers at the different pressures as you move down your breast. Stop when you feel your ribs. Move your fingers a little toward the center of your body. Start making circles with your fingers again, this time going up until  you reach your collarbone. Keep making up-and-down circles until you reach your armpit. Remember to keep using the three pressures. Feel the other breast in the same way. Sit or stand in the tub or shower. With soapy water on your skin, feel each breast the same way you did in step 2 when you were lying on the floor.  Write down what you find Writing down what you find can help you remember what to tell your doctor. Write down: What is normal for each breast. Any changes you find in each breast, including: The kind of changes you find. Whether you have pain. Size and location of any lumps. When you last had your menstrual period. General tips Check your breasts every month. If you are breastfeeding, the best time to check your breasts is after you feed your baby or after you use a breast pump. If you get menstrual periods, the best time to check your breasts is 5-7 days after your menstrual period is over. With time, you will become comfortable with the self-exam, and you will begin to know if there are changes in your breasts. Contact a doctor if you: See a change in the shape or size of your breasts or nipples. See a change in the skin of your breast or nipples, such as red or scaly skin. Have fluid coming from your nipples that is not normal. Find a lump or thick area that was not there before. Have pain in   your breasts. Have any concerns about your breast health. Summary Breast self-awareness includes looking for changes in your breasts, as well as feeling for changes within your breasts. Breast self-awareness should be done in front of a mirror in a well-lit room. You should check your breasts every month. If you get menstrual periods, the best time to check your breasts is 5-7 days after your menstrual period is over. Let your doctor know of any changes you see in your breasts, including changes in size, changes on the skin, pain or tenderness, or fluid from your nipples that is  not normal. This information is not intended to replace advice given to you by your health care provider. Make sure you discuss any questions you have with your healthcare provider. Document Revised: 02/25/2018 Document Reviewed: 02/25/2018 Elsevier Patient Education  2022 Elsevier Inc.     Preventive Care 40-64 Years Old, Female Preventive care refers to lifestyle choices and visits with your health care provider that can promote health and wellness. This includes: A yearly physical exam. This is also called an annual wellness visit. Regular dental and eye exams. Immunizations. Screening for certain conditions. Healthy lifestyle choices, such as: Eating a healthy diet. Getting regular exercise. Not using drugs or products that contain nicotine and tobacco. Limiting alcohol use. What can I expect for my preventive care visit? Physical exam Your health care provider will check your: Height and weight. These may be used to calculate your BMI (body mass index). BMI is a measurement that tells if you are at a healthy weight. Heart rate and blood pressure. Body temperature. Skin for abnormal spots. Counseling Your health care provider may ask you questions about your: Past medical problems. Family's medical history. Alcohol, tobacco, and drug use. Emotional well-being. Home life and relationship well-being. Sexual activity. Diet, exercise, and sleep habits. Work and work environment. Access to firearms. Method of birth control. Menstrual cycle. Pregnancy history. What immunizations do I need?  Vaccines are usually given at various ages, according to a schedule. Your health care provider will recommend vaccines for you based on your age, medicalhistory, and lifestyle or other factors, such as travel or where you work. What tests do I need? Blood tests Lipid and cholesterol levels. These may be checked every 5 years, or more often if you are over 50 years old. Hepatitis C  test. Hepatitis B test. Screening Lung cancer screening. You may have this screening every year starting at age 55 if you have a 30-pack-year history of smoking and currently smoke or have quit within the past 15 years. Colorectal cancer screening. All adults should have this screening starting at age 50 and continuing until age 75. Your health care provider may recommend screening at age 45 if you are at increased risk. You will have tests every 1-10 years, depending on your results and the type of screening test. Diabetes screening. This is done by checking your blood sugar (glucose) after you have not eaten for a while (fasting). You may have this done every 1-3 years. Mammogram. This may be done every 1-2 years. Talk with your health care provider about when you should start having regular mammograms. This may depend on whether you have a family history of breast cancer. BRCA-related cancer screening. This may be done if you have a family history of breast, ovarian, tubal, or peritoneal cancers. Pelvic exam and Pap test. This may be done every 3 years starting at age 21. Starting at age 30, this may be done every   5 years if you have a Pap test in combination with an HPV test. Other tests STD (sexually transmitted disease) testing, if you are at risk. Bone density scan. This is done to screen for osteoporosis. You may have this scan if you are at high risk for osteoporosis. Talk with your health care provider about your test results, treatment options,and if necessary, the need for more tests. Follow these instructions at home: Eating and drinking  Eat a diet that includes fresh fruits and vegetables, whole grains, lean protein, and low-fat dairy products. Take vitamin and mineral supplements as recommended by your health care provider. Do not drink alcohol if: Your health care provider tells you not to drink. You are pregnant, may be pregnant, or are planning to become pregnant. If  you drink alcohol: Limit how much you have to 0-1 drink a day. Be aware of how much alcohol is in your drink. In the [place] one drink equals one 12 oz bottle of beer (355 mL), one 5 oz glass of wine (148 mL), or one 1 oz glass of hard liquor (44 mL).  Lifestyle Take daily care of your teeth and gums. Brush your teeth every morning and night with fluoride toothpaste. Floss one time each day. Stay active. Exercise for at least 30 minutes 5 or more days each week. Do not use any products that contain nicotine or tobacco, such as cigarettes, e-cigarettes, and chewing tobacco. If you need help quitting, ask your health care provider. Do not use drugs. If you are sexually active, practice safe sex. Use a condom or other form of protection to prevent STIs (sexually transmitted infections). If you do not wish to become pregnant, use a form of birth control. If you plan to become pregnant, see your health care provider for a prepregnancy visit. If told by your health care provider, take low-dose aspirin daily starting at age 50. Find healthy ways to cope with stress, such as: Meditation, yoga, or listening to music. Journaling. Talking to a trusted person. Spending time with friends and family. Safety Always wear your seat belt while driving or riding in a vehicle. Do not drive: If you have been drinking alcohol. Do not ride with someone who has been drinking. When you are tired or distracted. While texting. Wear a helmet and other protective equipment during sports activities. If you have firearms in your house, make sure you follow all gun safety procedures. What's next? Visit your health care provider once a year for an annual wellness visit. Ask your health care provider how often you should have your eyes and teeth checked. Stay up to date on all vaccines. This information is not intended to replace advice given to you by your health care provider. Make sure you discuss any questions you  have with your healthcare provider. Document Revised: 04/12/2020 Document Reviewed: 03/20/2018 Elsevier Patient Education  2022 Elsevier Inc.  

## 2021-03-14 ENCOUNTER — Encounter: Payer: Self-pay | Admitting: Obstetrics and Gynecology

## 2021-03-14 ENCOUNTER — Other Ambulatory Visit (HOSPITAL_COMMUNITY)
Admission: RE | Admit: 2021-03-14 | Discharge: 2021-03-14 | Disposition: A | Discharge status: Home / Self Care | Payer: BC Managed Care – PPO | Source: Ambulatory Visit | Attending: Obstetrics and Gynecology | Admitting: Obstetrics and Gynecology

## 2021-03-14 ENCOUNTER — Other Ambulatory Visit: Payer: Self-pay

## 2021-03-14 ENCOUNTER — Ambulatory Visit (INDEPENDENT_AMBULATORY_CARE_PROVIDER_SITE_OTHER): Payer: BC Managed Care – PPO | Admitting: Obstetrics and Gynecology

## 2021-03-14 VITALS — BP 149/95 | HR 73 | Ht 61.0 in | Wt 208.6 lb

## 2021-03-14 DIAGNOSIS — N951 Menopausal and female climacteric states: Secondary | ICD-10-CM | POA: Insufficient documentation

## 2021-03-14 DIAGNOSIS — Z8619 Personal history of other infectious and parasitic diseases: Secondary | ICD-10-CM | POA: Insufficient documentation

## 2021-03-14 DIAGNOSIS — R7303 Prediabetes: Secondary | ICD-10-CM

## 2021-03-14 DIAGNOSIS — E785 Hyperlipidemia, unspecified: Secondary | ICD-10-CM

## 2021-03-14 DIAGNOSIS — I10 Essential (primary) hypertension: Secondary | ICD-10-CM

## 2021-03-14 DIAGNOSIS — Z124 Encounter for screening for malignant neoplasm of cervix: Secondary | ICD-10-CM

## 2021-03-14 DIAGNOSIS — Z01419 Encounter for gynecological examination (general) (routine) without abnormal findings: Secondary | ICD-10-CM

## 2021-03-14 DIAGNOSIS — Z1231 Encounter for screening mammogram for malignant neoplasm of breast: Secondary | ICD-10-CM

## 2021-03-14 NOTE — Progress Notes (Signed)
ANNUAL PREVENTATIVE CARE GYNECOLOGY  ENCOUNTER NOTE  Subjective:       Karen Donaldson is a 55 y.o. [phone] female here for a routine annual gynecologic exam. The patient is sexually active. The patient is not taking hormone replacement therapy. Patient denies post-menopausal vaginal bleeding. The patient wears seatbelts: yes. The patient participates in regular exercise: yes. Has the patient ever been transfused or tattooed?: no. The patient reports that there is not domestic violence in her life.  Current complaints: 1.  Hot flashes - not taking anything at this time. Notes she is just dealing with them.    Gynecologic History Patient's last menstrual period was 08/21/2018. Contraception: tubal ligation Last Pap: 03/10/2020. Results were: ASCUS HR HPV+ (neg 16/18) Last mammogram: 05/19/2020. Results were: normal Last Colonoscopy: 02/03/2016. Results were normal.    Obstetric History OB History  Gravida Para Term Preterm AB Living  2 2 2     2   SAB IAB Ectopic Multiple Live Births          2    # Outcome Date GA Lbr Len/2nd Weight Sex Delivery Anes PTL Lv  2 Term 2012   6 lb (2.722 kg) M CS-LTranv   LIV  1 Term 2009   5 lb 2.1 oz (2.327 kg) M CS-LTranv   LIV    Past Medical History:  Diagnosis Date   Abnormal Pap smear of cervix 01/2015   ascus/pos   Allergy    Dyslipidemia    High risk HPV infection    Hyperglycemia    Hypertension    Hypertrophy, uterus    Stress incontinence    Vitamin D deficiency     Family History  Problem Relation Age of Onset   Hypertension Mother    Diabetes Mother    Dementia Mother    Hypertension Father    Breast cancer Maternal Aunt    Ovarian cancer Neg Hx    Colon cancer Neg Hx    Heart disease Neg Hx     Past Surgical History:  Procedure Laterality Date   CESAREAN SECTION  2009, 2012   COLONOSCOPY WITH PROPOFOL N/A 02/03/2016   Procedure: COLONOSCOPY WITH PROPOFOL;  Surgeon: Lucilla Lame, MD;  Location: [facility];  Service: Endoscopy;  Laterality: N/A;   TUBAL LIGATION      Social History   Socioeconomic History   Marital status: Married    Spouse name: Doctor, general practice   Number of children: 2   Years of education: Not on file   Highest education level: Master's degree (e.g., MA, MS, MEng, MEd, MSW, MBA)  Occupational History   Not on file  Tobacco Use   Smoking status: Never   Smokeless tobacco: Never  Vaping Use   Vaping Use: Never used  Substance and Sexual Activity   Alcohol use: No    Alcohol/week: 0.0 standard drinks   Drug use: No   Sexual activity: Yes    Partners: Male    Birth control/protection: Surgical  Other Topics Concern   Not on file  Social History Narrative   Not on file   Social Determinants of Health   Financial Resource Strain: Low Risk    Difficulty of Paying Living Expenses: Not hard at all  Food Insecurity: No Food Insecurity   Worried About Charity fundraiser in the Last Year: Never true   [place] in the Last Year: Never true  Transportation Needs: No Transportation Needs   Lack of Transportation (  Medical): No   Lack of Transportation (Non-Medical): No  Physical Activity: Insufficiently Active   Days of Exercise per Week: 3 days   Minutes of Exercise per Session: 30 min  Stress: No Stress Concern Present   Feeling of Stress : Not at all  Social Connections: Socially Integrated   Frequency of Communication with Friends and Family: More than three times a week   Frequency of Social Gatherings with Friends and Family: More than three times a week   Attends Religious Services: More than 4 times per year   Active Member of [employer] or Organizations: Yes   Attends Music therapist: More than 4 times per year   Marital Status: Married  Human resources officer Violence: Not At Risk   Fear of Current or Ex-Partner: No   Emotionally Abused: No   Physically Abused: No   Sexually Abused: No    Current Outpatient Medications on File Prior to Visit   Medication Sig Dispense Refill   Calcium Carbonate-Vitamin D 600-400 MG-UNIT per chew tablet Chew 1 tablet by mouth daily.     diltiazem (CARDIZEM CD) 180 MG 24 hr capsule Take 1 capsule (180 mg total) by mouth daily. 90 capsule 1   FIBER PO Take by mouth daily as needed.     fluticasone (FLONASE) 50 MCG/ACT nasal spray Place 2 sprays into both nostrils daily. 16 g 6   hydrochlorothiazide (MICROZIDE) 12.5 MG capsule Take 1 capsule (12.5 mg total) by mouth daily. 90 capsule 1   loratadine (CLARITIN) 10 MG tablet Take 10 mg by mouth daily.     MULTIPLE VITAMIN PO Take 1 tablet by mouth daily.     No current facility-administered medications on file prior to visit.    Allergies  Allergen Reactions   Ace Inhibitors    Sulfamethoxazole-Trimethoprim Rash   Sulfur Rash      Review of Systems ROS Review of Systems - General ROS: negative for - chills, fatigue, fever, night sweats, weight gain or weight loss. Hot flashes occasional.  Psychological ROS: negative for - anxiety, decreased libido, depression, mood swings, physical abuse or sexual abuse Ophthalmic ROS: negative for - blurry vision, eye pain or loss of vision ENT ROS: negative for - headaches, hearing change, visual changes or vocal changes Allergy and Immunology ROS: negative for - hives, itchy/watery eyes or seasonal allergies Hematological and Lymphatic ROS: negative for - bleeding problems, bruising, swollen lymph nodes or weight loss Endocrine ROS: negative for - galactorrhea, hair pattern changes, hot flashes, malaise/lethargy, mood swings, palpitations, polydipsia/polyuria, skin changes, temperature intolerance or unexpected weight changes Breast ROS: negative for - new or changing breast lumps or nipple discharge Respiratory ROS: negative for - cough or shortness of breath Cardiovascular ROS: negative for - chest pain, irregular heartbeat, palpitations or shortness of breath Gastrointestinal ROS: no abdominal pain, change  in bowel habits, or black or bloody stools Genito-Urinary ROS: no dysuria, trouble voiding, or hematuria.  Musculoskeletal ROS: negative for - joint pain or joint stiffness Neurological ROS: negative for - bowel and bladder control changes Dermatological ROS: negative for rash and skin lesion changes   Objective:   BP (!) 149/95 (BP Location: Left Arm, Patient Position: Sitting, Cuff Size: Normal)   Pulse 73   Ht 5' 1"  (1.549 m)   Wt 208 lb 9.6 oz (94.6 kg)   LMP 08/21/2018   BMI 39.41 kg/m  CONSTITUTIONAL: Well-developed, well-nourished female in no acute distress.  PSYCHIATRIC: Normal mood and affect. Normal behavior. Normal judgment and  thought content. [place]: Alert and oriented to person, place, and time. Normal muscle tone coordination. No cranial nerve deficit noted. HENT:  Normocephalic, atraumatic, External right and left ear normal. Oropharynx is clear and moist EYES: Conjunctivae and EOM are normal. Pupils are equal, round, and reactive to light. No scleral icterus.  NECK: Normal range of motion, supple, no masses.  Normal thyroid.  SKIN: Skin is warm and dry. No rash noted. Not diaphoretic. No erythema. No pallor. CARDIOVASCULAR: Normal heart rate noted, regular rhythm, no murmur. RESPIRATORY: Clear to auscultation bilaterally. Effort and breath sounds normal, no problems with respiration noted. BREASTS: Symmetric in size. No masses, skin changes, nipple drainage, or lymphadenopathy. ABDOMEN: Soft, normal bowel sounds, no distention noted.  No tenderness, rebound or guarding.  BLADDER: Normal PELVIC:  Bladder no bladder distension noted  Urethra: normal appearing urethra with no masses, tenderness or lesions  Vulva: normal appearing vulva with no masses, tenderness or lesions  Vagina: normal appearing vagina with normal color and discharge, no lesions  Cervix: normal appearing cervix without discharge or lesions  Uterus: uterus is normal size, shape, consistency and  nontender  Adnexa: normal adnexa in size, nontender and no masses  RV: External Exam NormaI, No Rectal Masses, and Normal Sphincter tone  MUSCULOSKELETAL: Normal range of motion. No tenderness.  No cyanosis, clubbing, or edema.  2+ distal pulses. LYMPHATIC: No Axillary, Supraclavicular, or Inguinal Adenopathy.   Labs: Lab Results  Component Value Date   WBC 7.0 08/15/2020   HGB 15.8 (H) 08/15/2020   HCT 46.3 (H) 08/15/2020   MCV 82.5 08/15/2020   PLT 264 08/15/2020    Lab Results  Component Value Date   CREATININE 0.91 08/15/2020   BUN 19 08/15/2020   NA 141 08/15/2020   K 4.3 08/15/2020   CL 103 08/15/2020   CO2 29 08/15/2020    Lab Results  Component Value Date   ALT 22 08/15/2020   AST 19 08/15/2020   ALKPHOS 85 09/01/2018   BILITOT 0.4 08/15/2020    Lab Results  Component Value Date   CHOL 228 (H) 08/15/2020   HDL 53 08/15/2020   LDLCALC 148 (H) 08/15/2020   TRIG 148 08/15/2020   CHOLHDL 4.3 08/15/2020    No results found for: TSH  Lab Results  Component Value Date   HGBA1C 6.0 (H) 08/15/2020     Assessment:   1. Encounter for well woman exam with routine gynecological exam   2. Breast cancer screening by mammogram   3. Pap smear for cervical cancer screening   4. History of infection due to human papilloma virus (HPV)   5. Menopausal vasomotor syndrome   6. Pre-diabetes   7. Essential hypertension   8. Hyperlipidemia, unspecified hyperlipidemia type     Plan:  Pap: Pap Co Test ordered.  Mammogram: Ordered Stool Guaiac Testing:  Not Indicated. Up to date on colonoscopy.  Labs:  No labs ordered. Labs ordered by PCP.  Routine preventative health maintenance measures emphasized: Exercise/Diet/Weight control, Alcohol/Substance use risks, Stress Management, and Safe Sex COVID Vaccination status: Pfizer, 3 vaccines completed.  Comorbidites (HTN, Pre-diabetes, Hyperlipidemia) managed by PCP.  Menopausal hot flushes, manageable at this time per  patient without interventions.  Return to [place], MD [facility]

## 2021-03-17 LAB — CYTOLOGY - PAP
Adequacy: ABSENT
Diagnosis: NEGATIVE

## 2021-03-30 ENCOUNTER — Ambulatory Visit (INDEPENDENT_AMBULATORY_CARE_PROVIDER_SITE_OTHER): Payer: BC Managed Care – PPO

## 2021-03-30 ENCOUNTER — Other Ambulatory Visit: Payer: Self-pay

## 2021-03-30 DIAGNOSIS — Z23 Encounter for immunization: Secondary | ICD-10-CM | POA: Diagnosis not present

## 2021-05-22 ENCOUNTER — Other Ambulatory Visit: Payer: Self-pay

## 2021-05-22 ENCOUNTER — Ambulatory Visit
Admission: RE | Admit: 2021-05-22 | Discharge: 2021-05-22 | Disposition: A | Discharge status: Home / Self Care | Payer: BC Managed Care – PPO | Source: Ambulatory Visit | Attending: Obstetrics and Gynecology | Admitting: Obstetrics and Gynecology

## 2021-05-22 DIAGNOSIS — Z1231 Encounter for screening mammogram for malignant neoplasm of breast: Secondary | ICD-10-CM | POA: Insufficient documentation

## 2021-08-09 ENCOUNTER — Encounter: Payer: Self-pay | Admitting: Obstetrics and Gynecology

## 2021-08-11 ENCOUNTER — Other Ambulatory Visit: Payer: Self-pay

## 2021-08-11 ENCOUNTER — Ambulatory Visit: Payer: BC Managed Care – PPO | Admitting: Obstetrics and Gynecology

## 2021-08-11 ENCOUNTER — Encounter: Payer: Self-pay | Admitting: Obstetrics and Gynecology

## 2021-08-11 VITALS — BP 158/92 | HR 76 | Ht 63.0 in | Wt 208.0 lb

## 2021-08-11 DIAGNOSIS — N924 Excessive bleeding in the premenopausal period: Secondary | ICD-10-CM

## 2021-08-11 NOTE — Progress Notes (Signed)
° ° °  GYNECOLOGY PROGRESS NOTE  Subjective:    Patient ID: Karen Donaldson, female    DOB: 03/04/1966, 56 y.o.   MRN: [id number]  HPI  Patient is a 56 y.o. [id number] female who presents for abnormal bleeding. Notes that she has not had a cycle in over a year. Bleeding started Monday, was light, but progressively got heavier over the the next few days, changed from pink to bright red. Now changing to darker red/brown today. Last pap smear was 03/14/2021 and was normal.   The following portions of the patient's history were reviewed and updated as appropriate: allergies, current medications, past family history, past medical history, past social history, past surgical history, and problem list.  Review of Systems Pertinent items noted in HPI and remainder of comprehensive ROS otherwise negative.   Objective:   Blood pressure (!) 158/92, pulse 76, height 5' 3"  (1.6 m), weight 208 lb (94.3 kg), SpO2 99 %. Body mass index is 36.85 kg/m. General appearance: alert and no distress Abdomen: soft, non-tender; bowel sounds normal; no masses,  no organomegaly Pelvic: external genitalia normal, rectovaginal septum normal.  Vagina with scant dark red blood in vaginal vault. Cervix normal appearing, no lesions and no motion tenderness.  Uterus mobile, nontender, normal shape and size.  Adnexae non-palpable, nontender bilaterally.  Extremities: extremities normal, atraumatic, no cyanosis or edema Neurologic: Grossly normal   Assessment:   1. Menopausal bleeding     Plan:   Menopausal bleeding -By definition patient is menopausal based on the absence of menstrual cycles for over 12 months, however will check hormone levels to confirm.  If she is indeed menopausal, I discussed need for further evaluation if bleeding continues to occur, including work-up with endometrial biopsy and ultrasound.  If patient is noted to be perimenopausal, I discussed that these episodes have the possibility of occurring  again in the future until menopause truly occurs.  We will notify patient of lab results through [place], and arrange appropriate follow-up based on results.   Rubie Maid, MD [facility]

## 2021-08-14 ENCOUNTER — Other Ambulatory Visit: Payer: Self-pay

## 2021-08-14 ENCOUNTER — Other Ambulatory Visit: Payer: BC Managed Care – PPO

## 2021-08-15 LAB — FSH/LH
FSH: 21.8 m[IU]/mL
LH: 13.3 m[IU]/mL

## 2021-08-15 NOTE — Progress Notes (Signed)
Name: Karen Donaldson   MRN: [id number]    DOB: April 12, 1966   Date:08/16/2021       Progress Note  Subjective  Chief Complaint  Follow Up  HPI  Post-nasal drainage: symptoms started a couple of weeks ago, she has nasal congestion, she has to cough up green sputum, denies chest congestion, wheezing, SOB , fever or chills. Symptoms are improving gradually, sputum is now yellow in color. Taking mucinex . Using sinus rinse and flonase.   HTN: patient states she has been compliant with her medication, no chest pain, no palpitation, no edema.  BP used to be well controlled, but had two visits with Dr. Marcelline Mates that bp was above goal and today is also elevated in our office, we will adjust medication dose.    History of colitis: seen at [facility], had CT abdomen 08/2018 and showed left side colitis, per Dr. Durwin Reges likely ischemic colitis. She is doing well at this time. No recurrence since first incident, constipation has improved with increase in water intake and walking    Chronic constipation:  BM's have been  regular - she has a bowel movement about 2 times a week, [place] is now between 2-4 . She has not been taking Miralax, advised half cap daily to regular bowel movements, no blood or mucus in stools.    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 Last pap smear was also normal 08/22. She had vaginal bleeding last week, no cycles in the previous 2 years Dr. Marcelline Mates checked [facility] and [facility] and she is not post-menopausal yet, she had tubal ligation    AR: taking Zyrtec daily , having some uri symptoms currently. She stopped singulair but uses flonase    Hyperlipidemia: she is not taking medication. Explained  since ischemic colitis she may re-consider taking statin therapy  but she wants to hold off for now   The 10-year ASCVD risk score (Arnett DK, et al., 2019) is: 7.8%   Values used to calculate the score:     Age: 56 years     Sex: Female     Is Non-Hispanic African American: Yes     Diabetic: No     Tobacco smoker: No     Systolic Blood Pressure: 803 mmHg     Is BP treated: Yes     HDL Cholesterol: 53 mg/dL     Total Cholesterol: 228 mg/dL    Pre-diabetes: she denies polyphagia, polyuria or polydipsia. Last hgbA1C went up to 6 % she has family history of DM ( Mother ). We will recheck labs   Morbid obesity: weight is stable, she is exercising 2 times a week since it has been cold outside. Discussed portion control. BMI above 35 with co-morbidities such as dyslipidemia, HTN and pre-diabetes   Patient Active Problem List   Diagnosis Date Noted   Menopausal vasomotor syndrome 03/14/2021   Pre-diabetes 08/17/2020   History of ischemic colitis 08/13/2019   CIN I (cervical intraepithelial neoplasia I) 02/22/2015   Benign essential HTN 02/06/2015   Dyslipidemia 02/06/2015   Blood glucose elevated 02/06/2015   Obesity (BMI 30-39.9) 02/06/2015   Perennial allergic rhinitis with seasonal variation 02/06/2015   Stress incontinence  02/06/2015   Vitamin D deficiency 02/06/2015    Past Surgical History:  Procedure Laterality Date   CESAREAN SECTION  2009, 2012   COLONOSCOPY WITH PROPOFOL N/A 02/03/2016   Procedure: COLONOSCOPY WITH PROPOFOL;  Surgeon: Lucilla Lame, MD;  Location: [place];  Service: Endoscopy;  Laterality: N/A;   TUBAL LIGATION      Family History  Problem Relation Age of Onset   Hypertension Mother    Diabetes Mother    Dementia Mother    Hypertension Father    Breast cancer Maternal Aunt    Ovarian cancer Neg Hx    Colon cancer Neg Hx    Heart disease Neg Hx     Social History   Tobacco Use   Smoking status: Never   Smokeless tobacco: Never   Substance Use Topics   Alcohol use: No    Alcohol/week: 0.0 standard drinks     Current Outpatient Medications:    Calcium Carbonate-Vitamin D 600-400 MG-UNIT per chew tablet, Chew 1 tablet by mouth daily., Disp: , Rfl:    cetirizine (ZYRTEC) 10 MG tablet, Take 10 mg by mouth daily., Disp: , Rfl:    FIBER PO, Take by mouth daily as needed., Disp: , Rfl:    fluticasone (FLONASE) 50 MCG/ACT nasal spray, Place 2 sprays into both nostrils daily., Disp: 16 g, Rfl: 6   MULTIPLE VITAMIN PO, Take 1 tablet by mouth daily., Disp: , Rfl:    diltiazem (CARDIZEM CD) 240 MG 24 hr capsule, Take 1 capsule (240 mg total) by mouth daily., Disp: 90 capsule, Rfl: 0   hydrochlorothiazide (MICROZIDE) 12.5 MG capsule, Take 1 capsule (12.5 mg total) by mouth daily., Disp: 90 capsule, Rfl: 1  Allergies  Allergen Reactions   Ace Inhibitors    Sulfamethoxazole-Trimethoprim Rash   Sulfur Rash    I personally reviewed active problem list, medication list, allergies, family history, social history, health maintenance with the patient/caregiver today.   ROS  Constitutional: Negative for fever or weight change.  Respiratory: positive for a cough but no  shortness of breath.   Cardiovascular: Negative for chest pain or palpitations.  Gastrointestinal: Negative for abdominal pain, no bowel changes.  Musculoskeletal: Negative for gait problem or joint swelling.  Skin: Negative for rash.  Neurological: Negative for dizziness or headache.  No other specific complaints in a complete review of systems (except as listed in HPI above).   Objective  Vitals:   08/16/21 1540 08/16/21 1600  BP: (!) 138/92 140/90  Pulse: 86   Resp: 16   Temp: 98.2 F (36.8 C)   TempSrc: Oral   SpO2: 99%   Weight: 208 lb 11.2 oz (94.7 kg)   Height: 5' 3"  (1.6 m)     Body mass index is 36.97 kg/m.  Physical Exam  Constitutional: Patient appears well-developed and well-nourished. Obese  No distress.  HEENT: head  atraumatic, normocephalic, pupils equal and reactive to light, ears normal, neck supple Cardiovascular: Normal rate, regular rhythm and normal heart sounds.  No murmur heard. No BLE edema. Pulmonary/Chest: Effort normal and breath sounds normal. No respiratory distress. Abdominal: Soft.  There is no tenderness. Psychiatric: Patient has a normal mood and affect. behavior is normal. Judgment and thought content normal.   Recent Results (from the past 2160 hour(s))  FSH/LH     Status: None   Collection Time: 08/14/21  3:36 PM  Result Value Ref Range   LH 13.3 mIU/mL    Comment:  Adult Female:                       Follicular phase      2.4 -  12.6                       Ovulation phase      14.0 -  95.6                       Luteal phase          1.0 -  11.4                       Postmenopausal        7.7 -  58.5    FSH 21.8 mIU/mL    Comment:                     Adult Female:                       Follicular phase      3.5 -  12.5                       Ovulation phase       4.7 -  21.5                       Luteal phase          1.7 -   7.7                       Postmenopausal       25.8 - 134.8     PHQ2/9: Depression screen [facility] 2/9 08/16/2021 01/05/2021 08/15/2020 08/13/2019 02/13/2019  Decreased Interest 0 0 0 0 0  Down, Depressed, Hopeless 0 0 0 0 0  PHQ - 2 Score 0 0 0 0 0  Altered sleeping 0 - - 0 0  Tired, decreased energy 0 - - 1 0  Change in appetite 0 - - 0 0  Feeling bad or failure about yourself  0 - - 0 0  Trouble concentrating 0 - - 0 0  Moving slowly or fidgety/restless 0 - - 0 0  Suicidal thoughts 0 - - 0 0  PHQ-9 Score 0 - - 1 0  Difficult doing work/chores - - - Not difficult at all Not difficult at all    phq 9 is negative   Fall Risk: Fall Risk  08/16/2021 01/05/2021 08/15/2020 08/13/2019 02/13/2019  Falls in the past year? 0 0 0 0 0  Number falls in past yr: - 0 0 0 0  Injury with Fall? - 0 0 0 0  Follow up Falls prevention discussed - - - -       Functional Status Survey: Is the patient deaf or have difficulty hearing?: No Does the patient have difficulty seeing, even when wearing glasses/contacts?: No Does the patient have difficulty concentrating, remembering, or making decisions?: No Does the patient have difficulty walking or climbing stairs?: No Does the patient have difficulty dressing or bathing?: No Does the patient have difficulty doing errands alone such as visiting a doctor's office or shopping?: No    Assessment & Plan  1. Benign essential HTN  Since bp elevated 3 visits in a row this past week we will adjust dose  of Cardizem but asked her to return in one week for bp check with CMA - hydrochlorothiazide (MICROZIDE) 12.5 MG capsule; Take 1 capsule (12.5 mg total) by mouth daily.  Dispense: 90 capsule; Refill: 1 - diltiazem (CARDIZEM CD) 240 MG 24 hr capsule; Take 1 capsule (240 mg total) by mouth daily.  Dispense: 90 capsule; Refill: 0 - CBC with Differential/Platelet - COMPLETE METABOLIC PANEL WITH GFR - TSH  2. Morbid obesity ([place])  Discussed with the patient the risk posed by an increased BMI. Discussed importance of portion control, calorie counting and at least 150 minutes of physical activity weekly. Avoid sweet beverages and drink more water. Eat at least 6 servings of fruit and vegetables daily    3. Vitamin D deficiency   4. Need for immunization against influenza  - Flu Vaccine QUAD 57moIM (Fluarix, Fluzone & Alfiuria Quad PF)  5. History of ischemic colitis   6. Perennial allergic rhinitis with seasonal variation   7. Pre-diabetes  - Hemoglobin A1c  8. Dyslipidemia  - Lipid panel

## 2021-08-16 ENCOUNTER — Ambulatory Visit: Payer: BC Managed Care – PPO | Admitting: Family Medicine

## 2021-08-16 ENCOUNTER — Encounter: Payer: Self-pay | Admitting: Family Medicine

## 2021-08-16 VITALS — BP 140/90 | HR 86 | Temp 98.2°F | Resp 16 | Ht 63.0 in | Wt 208.7 lb

## 2021-08-16 DIAGNOSIS — E785 Hyperlipidemia, unspecified: Secondary | ICD-10-CM

## 2021-08-16 DIAGNOSIS — Z23 Encounter for immunization: Secondary | ICD-10-CM

## 2021-08-16 DIAGNOSIS — I10 Essential (primary) hypertension: Secondary | ICD-10-CM

## 2021-08-16 DIAGNOSIS — E559 Vitamin D deficiency, unspecified: Secondary | ICD-10-CM | POA: Diagnosis not present

## 2021-08-16 DIAGNOSIS — J3089 Other allergic rhinitis: Secondary | ICD-10-CM

## 2021-08-16 DIAGNOSIS — J302 Other seasonal allergic rhinitis: Secondary | ICD-10-CM

## 2021-08-16 DIAGNOSIS — Z8719 Personal history of other diseases of the digestive system: Secondary | ICD-10-CM

## 2021-08-16 DIAGNOSIS — R7303 Prediabetes: Secondary | ICD-10-CM

## 2021-08-16 MED ORDER — DILTIAZEM HCL ER COATED BEADS 240 MG PO CP24: 240.0000 mg | ORAL_CAPSULE | Freq: Every day | ORAL | 0 refills | Status: DC

## 2021-08-16 MED ORDER — HYDROCHLOROTHIAZIDE 12.5 MG PO CAPS: 12.5000 mg | ORAL_CAPSULE | Freq: Every day | ORAL | 1 refills | Status: DC

## 2021-08-17 LAB — COMPLETE METABOLIC PANEL WITH GFR
AG Ratio: 1.6 (calc) (ref 1.0–2.5)
ALT: 16 U/L (ref 6–29)
AST: 16 U/L (ref 10–35)
Albumin: 4.4 g/dL (ref 3.6–5.1)
Alkaline phosphatase (APISO): 77 U/L (ref 37–153)
BUN: 20 mg/dL (ref 7–25)
CO2: 31 mmol/L (ref 20–32)
Calcium: 10 mg/dL (ref 8.6–10.4)
Chloride: 103 mmol/L (ref 98–110)
Creat: 0.96 mg/dL (ref 0.50–1.03)
Globulin: 2.8 g/dL (calc) (ref 1.9–3.7)
Glucose, Bld: 84 mg/dL (ref 65–99)
Potassium: 4.3 mmol/L (ref 3.5–5.3)
Sodium: 139 mmol/L (ref 135–146)
Total Bilirubin: 0.4 mg/dL (ref 0.2–1.2)
Total Protein: 7.2 g/dL (ref 6.1–8.1)
eGFR: 70 mL/min/{1.73_m2} (ref >=60)

## 2021-08-17 LAB — HEMOGLOBIN A1C
Hgb A1c MFr Bld: 5.9 %{Hb} — ABNORMAL HIGH
Mean Plasma Glucose: 123 mg/dL
eAG (mmol/L): 6.8 mmol/L

## 2021-08-17 LAB — CBC WITH DIFFERENTIAL/PLATELET
Absolute Monocytes: 548 cells/uL (ref 200–950)
Basophils Absolute: 52 cells/uL (ref 0–200)
Basophils Relative: 0.7 %
Eosinophils Absolute: 118 cells/uL (ref 15–500)
Eosinophils Relative: 1.6 %
HCT: 45.4 % — ABNORMAL HIGH (ref 35.0–45.0)
Hemoglobin: 15 g/dL (ref 11.7–15.5)
Lymphs Abs: 2805 cells/uL (ref 850–3900)
MCH: 27.8 pg (ref 27.0–33.0)
MCHC: 33 g/dL (ref 32.0–36.0)
MCV: 84.1 fL (ref 80.0–100.0)
MPV: 11.7 fL (ref 7.5–12.5)
Monocytes Relative: 7.4 %
Neutro Abs: 3878 cells/uL (ref 1500–7800)
Neutrophils Relative %: 52.4 %
Platelets: 318 10*3/uL (ref 140–400)
RBC: 5.4 10*6/uL — ABNORMAL HIGH (ref 3.80–5.10)
RDW: 13.8 % (ref 11.0–15.0)
Total Lymphocyte: 37.9 %
WBC: 7.4 10*3/uL (ref 3.8–10.8)

## 2021-08-17 LAB — LIPID PANEL
Cholesterol: 219 mg/dL — ABNORMAL HIGH (ref <200)
HDL: 52 mg/dL (ref >=50)
LDL Cholesterol (Calc): 140 mg/dL (calc) — ABNORMAL HIGH
Non-HDL Cholesterol (Calc): 167 mg/dL (calc) — ABNORMAL HIGH (ref <130)
Total CHOL/HDL Ratio: 4.2 (calc) (ref <5.0)
Triglycerides: 148 mg/dL (ref <150)

## 2021-08-17 LAB — TSH: TSH: 2.8 m[IU]/L

## 2021-11-14 ENCOUNTER — Ambulatory Visit: Payer: BC Managed Care – PPO

## 2021-11-14 ENCOUNTER — Other Ambulatory Visit: Payer: Self-pay | Admitting: Family Medicine

## 2021-11-14 VITALS — BP 140/98

## 2021-11-14 DIAGNOSIS — I10 Essential (primary) hypertension: Secondary | ICD-10-CM

## 2021-11-14 MED ORDER — TRIAMTERENE-HCTZ 37.5-25 MG PO TABS: 1.0000 | ORAL_TABLET | Freq: Every day | ORAL | 0 refills | Status: DC

## 2021-11-14 MED ORDER — TRIAMTERENE-HCTZ 37.5-25 MG PO TABS: 0.5000 | ORAL_TABLET | Freq: Every day | ORAL | 0 refills | Status: DC

## 2021-11-14 NOTE — Progress Notes (Signed)
Patient here for blood pressure check. She is currently on HCTZ 12.46m and Diltiazem was adjusted to 244m  Patient denies any side effects, blood pressure today is 140/98.  Provider will be notified to advise ?

## 2021-11-20 ENCOUNTER — Other Ambulatory Visit: Payer: Self-pay | Admitting: Family Medicine

## 2021-11-20 DIAGNOSIS — I10 Essential (primary) hypertension: Secondary | ICD-10-CM

## 2022-02-14 ENCOUNTER — Other Ambulatory Visit: Payer: Self-pay | Admitting: Family Medicine

## 2022-02-14 DIAGNOSIS — I10 Essential (primary) hypertension: Secondary | ICD-10-CM

## 2022-02-15 NOTE — Progress Notes (Signed)
Name: Karen Donaldson   MRN: [id number]    DOB: Aug 28, 1965   Date:02/16/2022       Progress Note  Subjective  Chief Complaint  Follow Up  HPI   HTN: patient states she has been compliant with her medication, no chest pain, no palpitation, no edema.  BP at home still goes on DBP around 90, she has been taking half of triamterene hctz and we will increase to once a day    History of colitis: seen at [facility], had CT abdomen 08/2018 and showed left side colitis, per Dr. Durwin Reges likely ischemic colitis. She is doing well at this time. No recurrence since first incident, no problems of constipation at this time - taking Miralax half cup intermittently    Chronic constipation:  BM's have been  regular - she has a bowel movement about 2 times a week, [place] is now between 2-4 . She has not been taking Miralax, advised half cap daily to regular bowel movements, no blood or mucus in stools.    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 Last pap smear was also normal 08/22. She had vaginal bleeding last week, no cycles in the previous 2 years Dr. Marcelline Mates , she is going back in August    AR: taking Zyrtec daily and flonase prn and doing well at this time    Hyperlipidemia: she is not taking medication. Explained since ischemic colitis she may re-consider taking statin therapy  but she wants to hold off for now Unchanged   The 10-year ASCVD risk score (Arnett DK, et al., 2019) is: 7.7%   Values used to calculate the score:     Age: 56 years     Sex: Female     Is Non-Hispanic African American: Yes     Diabetic: No     Tobacco smoker: No     Systolic Blood Pressure: 409 mmHg     Is BP treated: Yes      HDL Cholesterol: 52 mg/dL     Total Cholesterol: 219 mg/dL    Pre-diabetes: she denies polyphagia, polyuria or polydipsia. Last hgbA1C went up to 6 % she has family history of DM ( Mother ). We will recheck labs   Morbid obesity: weight is stable, she is exercising 2 times a week since it has been cold outside. Discussed portion control. BMI above 35 with co-morbidities such as dyslipidemia, HTN and pre-diabetes   Patient Active Problem List   Diagnosis Date Noted   Menopausal vasomotor syndrome 03/14/2021   Pre-diabetes 08/17/2020   History of ischemic colitis 08/13/2019   CIN I (cervical intraepithelial neoplasia I) 02/22/2015   Benign essential HTN 02/06/2015   Dyslipidemia 02/06/2015   Blood glucose elevated 02/06/2015   Obesity (BMI 30-39.9) 02/06/2015   Perennial allergic rhinitis with seasonal variation 02/06/2015   Stress incontinence 02/06/2015   Vitamin D deficiency 02/06/2015    Past Surgical History:  Procedure Laterality Date   CESAREAN SECTION  2009, 2012   COLONOSCOPY WITH PROPOFOL N/A 02/03/2016   Procedure: COLONOSCOPY WITH PROPOFOL;  Surgeon: Lucilla Lame, MD;  Location: [place];  Service: Endoscopy;  Laterality: N/A;   TUBAL LIGATION  Family History  Problem Relation Age of Onset   Hypertension Mother    Diabetes Mother    Dementia Mother    Hypertension Father    Breast cancer Maternal Aunt    Ovarian cancer Neg Hx    Colon cancer Neg Hx    Heart disease Neg Hx     Social History   Tobacco Use   Smoking status: Never   Smokeless tobacco: Never  Substance Use Topics   Alcohol use: No    Alcohol/week: 0.0 standard drinks of alcohol     Current Outpatient Medications:    Calcium Carbonate-Vitamin D 600-400 MG-UNIT per chew tablet, Chew 1 tablet by mouth daily., Disp: , Rfl:    cetirizine (ZYRTEC) 10 MG tablet, Take 10 mg by mouth daily., Disp: , Rfl:    diltiazem (CARDIZEM CD) 240 MG 24 hr capsule, TAKE 1 CAPSULE BY MOUTH EVERY  DAY, Disp: 90 capsule, Rfl: 0   FIBER PO, Take by mouth daily as needed., Disp: , Rfl:    fluticasone (FLONASE) 50 MCG/ACT nasal spray, Place 2 sprays into both nostrils daily., Disp: 16 g, Rfl: 6   MULTIPLE VITAMIN PO, Take 1 tablet by mouth daily., Disp: , Rfl:    triamterene-hydrochlorothiazide (MAXZIDE-25) 37.5-25 MG tablet, Take 0.5-1 tablets by mouth daily. In place of hctz for BP, Disp: 90 tablet, Rfl: 0  Allergies  Allergen Reactions   Ace Inhibitors    Sulfamethoxazole-Trimethoprim Rash   Sulfur Rash    I personally reviewed active problem list, medication list, allergies, family history, social history, health maintenance with the patient/caregiver today.   ROS  Constitutional: Negative for fever or weight change.  Respiratory: Negative for cough and shortness of breath.   Cardiovascular: Negative for chest pain or palpitations.  Gastrointestinal: Negative for abdominal pain, no bowel changes.  Musculoskeletal: Negative for gait problem or joint swelling.  Skin: Negative for rash.  Neurological: Negative for dizziness or headache.  No other specific complaints in a complete review of systems (except as listed in HPI above).   Objective  Vitals:   02/16/22 1004  BP: 138/88  Pulse: 85  Resp: 16  SpO2: 97%  Weight: 209 lb (94.8 kg)  Height: 5' 1"  (1.549 m)    Body mass index is 39.49 kg/m.  Physical Exam  Constitutional: Patient appears well-developed and well-nourished. Obese  No distress.  HEENT: head atraumatic, normocephalic, pupils equal and reactive to light, neck supple Cardiovascular: Normal rate, regular rhythm and normal heart sounds.  No murmur heard. No BLE edema. Pulmonary/Chest: Effort normal and breath sounds normal. No respiratory distress. Abdominal: Soft.  There is no tenderness. Psychiatric: Patient has a normal mood and affect. behavior is normal. Judgment and thought content normal.    PHQ2/9:    02/16/2022   10:03 AM 08/16/2021     3:43 PM 01/05/2021    7:44 AM 08/15/2020    3:44 PM 08/13/2019   11:13 AM  Depression screen PHQ 2/9  Decreased Interest 0 0 0 0 0  Down, Depressed, Hopeless 0 0 0 0 0  PHQ - 2 Score 0 0 0 0 0  Altered sleeping 0 0   0  Tired, decreased energy 0 0   1  Change in appetite 0 0   0  Feeling bad or failure about yourself  0 0   0  Trouble concentrating 0 0   0  Moving slowly or fidgety/restless 0 0   0  Suicidal thoughts 0 0  0  PHQ-9 Score 0 0   1  Difficult doing work/chores     Not difficult at all    phq 9 is negative   Fall Risk:    02/16/2022   10:01 AM 08/16/2021    3:42 PM 01/05/2021    7:44 AM 08/15/2020    3:44 PM 08/13/2019   11:12 AM  Fall Risk   Falls in the past year? 0 0 0 0 0  Number falls in past yr: 0  0 0 0  Injury with Fall? 0  0 0 0  Risk for fall due to : No Fall Risks      Follow up Falls prevention discussed Falls prevention discussed         Functional Status Survey: Is the patient deaf or have difficulty hearing?: No Does the patient have difficulty seeing, even when wearing glasses/contacts?: No Does the patient have difficulty concentrating, remembering, or making decisions?: No Does the patient have difficulty walking or climbing stairs?: No Does the patient have difficulty dressing or bathing?: No Does the patient have difficulty doing errands alone such as visiting a doctor's office or shopping?: No    Assessment & Plan  1. Benign essential HTN  - diltiazem (CARDIZEM CD) 240 MG 24 hr capsule; Take 1 capsule (240 mg total) by mouth daily.  Dispense: 90 capsule; Refill: 0 - triamterene-hydrochlorothiazide (MAXZIDE-25) 37.5-25 MG tablet; Take 0.5-1 tablets by mouth daily. In place of hctz for BP  Dispense: 90 tablet; Refill: 1  2. Vitamin D deficiency   3. Morbid obesity ([place])  Discussed with the patient the risk posed by an increased BMI. Discussed importance of portion control, calorie counting and at least 150 minutes of physical  activity weekly. Avoid sweet beverages and drink more water. Eat at least 6 servings of fruit and vegetables daily    4. Pre-diabetes   5. History of ischemic colitis   6. Perennial allergic rhinitis with seasonal variation   7. Dyslipidemia  She does not want statin therapy at this time  8. Obesity (BMI 30-39.9)

## 2022-02-16 ENCOUNTER — Ambulatory Visit (INDEPENDENT_AMBULATORY_CARE_PROVIDER_SITE_OTHER): Payer: BC Managed Care – PPO | Admitting: Family Medicine

## 2022-02-16 ENCOUNTER — Encounter: Payer: Self-pay | Admitting: Family Medicine

## 2022-02-16 VITALS — BP 138/88 | HR 85 | Resp 16 | Ht 61.0 in | Wt 209.0 lb

## 2022-02-16 DIAGNOSIS — J3089 Other allergic rhinitis: Secondary | ICD-10-CM

## 2022-02-16 DIAGNOSIS — Z8719 Personal history of other diseases of the digestive system: Secondary | ICD-10-CM

## 2022-02-16 DIAGNOSIS — J302 Other seasonal allergic rhinitis: Secondary | ICD-10-CM

## 2022-02-16 DIAGNOSIS — E559 Vitamin D deficiency, unspecified: Secondary | ICD-10-CM | POA: Diagnosis not present

## 2022-02-16 DIAGNOSIS — R7303 Prediabetes: Secondary | ICD-10-CM

## 2022-02-16 DIAGNOSIS — I10 Essential (primary) hypertension: Secondary | ICD-10-CM

## 2022-02-16 DIAGNOSIS — E785 Hyperlipidemia, unspecified: Secondary | ICD-10-CM

## 2022-02-16 DIAGNOSIS — E669 Obesity, unspecified: Secondary | ICD-10-CM

## 2022-02-16 MED ORDER — TRIAMTERENE-HCTZ 37.5-25 MG PO TABS: 0.5000 | ORAL_TABLET | Freq: Every day | ORAL | 1 refills | Status: DC

## 2022-02-16 MED ORDER — DILTIAZEM HCL ER COATED BEADS 240 MG PO CP24: 240.0000 mg | ORAL_CAPSULE | Freq: Every day | ORAL | 0 refills | Status: DC

## 2022-03-30 ENCOUNTER — Encounter: Payer: Self-pay | Admitting: Obstetrics and Gynecology

## 2022-03-30 ENCOUNTER — Ambulatory Visit (INDEPENDENT_AMBULATORY_CARE_PROVIDER_SITE_OTHER): Payer: BC Managed Care – PPO | Admitting: Obstetrics and Gynecology

## 2022-03-30 VITALS — BP 110/88 | HR 78 | Resp 16 | Ht 61.0 in | Wt 210.0 lb

## 2022-03-30 DIAGNOSIS — E669 Obesity, unspecified: Secondary | ICD-10-CM

## 2022-03-30 DIAGNOSIS — Z6830 Body mass index (BMI) 30.0-30.9, adult: Secondary | ICD-10-CM

## 2022-03-30 DIAGNOSIS — R7303 Prediabetes: Secondary | ICD-10-CM | POA: Diagnosis not present

## 2022-03-30 DIAGNOSIS — E785 Hyperlipidemia, unspecified: Secondary | ICD-10-CM

## 2022-03-30 DIAGNOSIS — Z01411 Encounter for gynecological examination (general) (routine) with abnormal findings: Secondary | ICD-10-CM | POA: Diagnosis not present

## 2022-03-30 DIAGNOSIS — I10 Essential (primary) hypertension: Secondary | ICD-10-CM | POA: Diagnosis not present

## 2022-03-30 DIAGNOSIS — Z01419 Encounter for gynecological examination (general) (routine) without abnormal findings: Secondary | ICD-10-CM

## 2022-03-30 DIAGNOSIS — Z124 Encounter for screening for malignant neoplasm of cervix: Secondary | ICD-10-CM

## 2022-03-30 DIAGNOSIS — Z1231 Encounter for screening mammogram for malignant neoplasm of breast: Secondary | ICD-10-CM

## 2022-03-30 DIAGNOSIS — Z8619 Personal history of other infectious and parasitic diseases: Secondary | ICD-10-CM

## 2022-03-30 NOTE — Patient Instructions (Addendum)
Preventive Care 8-56 Years Old, Female Preventive care refers to lifestyle choices and visits with your health care provider that can promote health and wellness. Preventive care visits are also called wellness exams. What can I expect for my preventive care visit? Counseling Your health care provider may ask you questions about your: Medical history, including: Past medical problems. Family medical history. Pregnancy history. Current health, including: Menstrual cycle. Method of birth control. Emotional well-being. Home life and relationship well-being. Sexual activity and sexual health. Lifestyle, including: Alcohol, nicotine or tobacco, and drug use. Access to firearms. Diet, exercise, and sleep habits. Work and work Statistician. Sunscreen use. Safety issues such as seatbelt and bike helmet use. Physical exam Your health care provider will check your: Height and weight. These may be used to calculate your BMI (body mass index). BMI is a measurement that tells if you are at a healthy weight. Waist circumference. This measures the distance around your waistline. This measurement also tells if you are at a healthy weight and may help predict your risk of certain diseases, such as type 2 diabetes and high blood pressure. Heart rate and blood pressure. Body temperature. Skin for abnormal spots. What immunizations do I need?  Vaccines are usually given at various ages, according to a schedule. Your health care provider will recommend vaccines for you based on your age, medical history, and lifestyle or other factors, such as travel or where you work. What tests do I need? Screening Your health care provider may recommend screening tests for certain conditions. This may include: Lipid and cholesterol levels. Diabetes screening. This is done by checking your blood sugar (glucose) after you have not eaten for a while (fasting). Pelvic exam and Pap test. Hepatitis B test. Hepatitis  C test. HIV (human immunodeficiency virus) test. STI (sexually transmitted infection) testing, if you are at risk. Lung cancer screening. Colorectal cancer screening. Mammogram. Talk with your health care provider about when you should start having regular mammograms. This may depend on whether you have a family history of breast cancer. BRCA-related cancer screening. This may be done if you have a family history of breast, ovarian, tubal, or peritoneal cancers. Bone density scan. This is done to screen for osteoporosis. Talk with your health care provider about your test results, treatment options, and if necessary, the need for more tests. Follow these instructions at home: Eating and drinking  Eat a diet that includes fresh fruits and vegetables, whole grains, lean protein, and low-fat dairy products. Take vitamin and mineral supplements as recommended by your health care provider. Do not drink alcohol if: Your health care provider tells you not to drink. You are pregnant, may be pregnant, or are planning to become pregnant. If you drink alcohol: Limit how much you have to 0-1 drink a day. Know how much alcohol is in your drink. In the [place] one drink equals one 12 oz bottle of beer (355 mL), one 5 oz glass of wine (148 mL), or one 1 oz glass of hard liquor (44 mL). Lifestyle Brush your teeth every morning and night with fluoride toothpaste. Floss one time each day. Exercise for at least 30 minutes 5 or more days each week. Do not use any products that contain nicotine or tobacco. These products include cigarettes, chewing tobacco, and vaping devices, such as e-cigarettes. If you need help quitting, ask your health care provider. Do not use drugs. If you are sexually active, practice safe sex. Use a condom or other form of protection to  prevent STIs. If you do not wish to become pregnant, use a form of birth control. If you plan to become pregnant, see your health care provider for a  prepregnancy visit. Take aspirin only as told by your health care provider. Make sure that you understand how much to take and what form to take. Work with your health care provider to find out whether it is safe and beneficial for you to take aspirin daily. Find healthy ways to manage stress, such as: Meditation, yoga, or listening to music. Journaling. Talking to a trusted person. Spending time with friends and family. Minimize exposure to UV radiation to reduce your risk of skin cancer. Safety Always wear your seat belt while driving or riding in a vehicle. Do not drive: If you have been drinking alcohol. Do not ride with someone who has been drinking. When you are tired or distracted. While texting. If you have been using any mind-altering substances or drugs. Wear a helmet and other protective equipment during sports activities. If you have firearms in your house, make sure you follow all gun safety procedures. Seek help if you have been physically or sexually abused. What's next? Visit your health care provider once a year for an annual wellness visit. Ask your health care provider how often you should have your eyes and teeth checked. Stay up to date on all vaccines. This information is not intended to replace advice given to you by your health care provider. Make sure you discuss any questions you have with your health care provider. Document Revised: 01/04/2021 Document Reviewed: 01/04/2021 Elsevier Patient Education  [place].

## 2022-03-30 NOTE — Progress Notes (Signed)
ANNUAL PREVENTATIVE CARE GYNECOLOGY  ENCOUNTER NOTE  Subjective:       Karen Donaldson is a 56 y.o. [phone] female here for a routine annual gynecologic exam. The patient is sexually active. The patient is not taking hormone replacement therapy. Patient denies post-menopausal vaginal bleeding.  Did have one episode of bleeding earlier this year (January) but had not had a cycle in over a year. Hormone levels still confirmed patient was perimenopausal. The patient wears seatbelts: yes. The patient participates in regular exercise: yes. Has the patient ever been transfused or tattooed?: no. The patient reports that there is not domestic violence in her life.  Current complaints: 1.  None    Gynecologic History No LMP recorded. (Menstrual status: Perimenopausal). Contraception: tubal ligation Last Pap: 03/14/2021. Results were: normal. H/o abnormal pap (HPV+) in 2021.  Last mammogram: 05/22/2021. Results were: normal Last Colonoscopy: 02/03/2016. Results were: normal  Last Dexa Scan: Never had one.    Obstetric History OB History  Gravida Para Term Preterm AB Living  2 2 2     2   SAB IAB Ectopic Multiple Live Births          2    # Outcome Date GA Lbr Len/2nd Weight Sex Delivery Anes PTL Lv  2 Term 2012   6 lb (2.722 kg) M CS-LTranv   LIV  1 Term 2009   5 lb 2.1 oz (2.327 kg) M CS-LTranv   LIV    Past Medical History:  Diagnosis Date   Abnormal Pap smear of cervix 01/2015   ascus/pos   Allergy    Dyslipidemia    High risk HPV infection    Hyperglycemia    Hypertension    Hypertrophy, uterus    Stress incontinence    Vitamin D deficiency     Family History  Problem Relation Age of Onset   Hypertension Mother    Diabetes Mother    Dementia Mother    Hypertension Father    Breast cancer Maternal Aunt    Ovarian cancer Neg Hx    Colon cancer Neg Hx    Heart disease Neg Hx     Past Surgical History:  Procedure Laterality Date   CESAREAN SECTION  2009, 2012    COLONOSCOPY WITH PROPOFOL N/A 02/03/2016   Procedure: COLONOSCOPY WITH PROPOFOL;  Surgeon: Lucilla Lame, MD;  Location: [place];  Service: Endoscopy;  Laterality: N/A;   TUBAL LIGATION      Social History   Socioeconomic History   Marital status: Married    Spouse name: Doctor, general practice   Number of children: 2   Years of education: Not on file   Highest education level: Master's degree (e.g., MA, MS, MEng, MEd, MSW, MBA)  Occupational History   Not on file  Tobacco Use   Smoking status: Never   Smokeless tobacco: Never  Vaping Use   Vaping Use: Never used  Substance and Sexual Activity   Alcohol use: No    Alcohol/week: 0.0 standard drinks of alcohol   Drug use: No   Sexual activity: Yes    Partners: Male    Birth control/protection: Surgical  Other Topics Concern   Not on file  Social History Narrative   Not on file   Social Determinants of Health   Financial Resource Strain: Low Risk  (08/15/2020)   Overall Financial Resource Strain (CARDIA)    Difficulty of Paying Living Expenses: Not hard at all  Food Insecurity: No Food Insecurity (08/15/2020)  Hunger Vital Sign    Worried About Running Out of Food in the Last Year: Never true    Ran Out of Food in the Last Year: Never true  Transportation Needs: No Transportation Needs (08/15/2020)   PRAPARE - Hydrologist (Medical): No    Lack of Transportation (Non-Medical): No  Physical Activity: Insufficiently Active (08/15/2020)   Exercise Vital Sign    Days of Exercise per Week: 3 days    Minutes of Exercise per Session: 30 min  Stress: No Stress Concern Present (08/15/2020)   [place]    Feeling of Stress : Not at all  Social Connections: [place] (08/15/2020)   Social Connection and Isolation Panel [NHANES]    Frequency of Communication with Friends and Family: More than three times a week    Frequency of Social  Gatherings with Friends and Family: More than three times a week    Attends Religious Services: More than 4 times per year    Active Member of [employer] or Organizations: Yes    Attends Music therapist: More than 4 times per year    Marital Status: Married  Human resources officer Violence: Not At Risk (08/15/2020)   Humiliation, Afraid, Rape, and Kick questionnaire    Fear of Current or Ex-Partner: No    Emotionally Abused: No    Physically Abused: No    Sexually Abused: No    Current Outpatient Medications on File Prior to Visit  Medication Sig Dispense Refill   Calcium Carbonate-Vitamin D 600-400 MG-UNIT per chew tablet Chew 1 tablet by mouth daily.     cetirizine (ZYRTEC) 10 MG tablet Take 10 mg by mouth daily.     diltiazem (CARDIZEM CD) 240 MG 24 hr capsule Take 1 capsule (240 mg total) by mouth daily. 90 capsule 0   FIBER PO Take by mouth daily as needed.     fluticasone (FLONASE) 50 MCG/ACT nasal spray Place 2 sprays into both nostrils daily. 16 g 6   MULTIPLE VITAMIN PO Take 1 tablet by mouth daily.     triamterene-hydrochlorothiazide (MAXZIDE-25) 37.5-25 MG tablet Take 0.5-1 tablets by mouth daily. In place of hctz for BP 90 tablet 1   No current facility-administered medications on file prior to visit.    Allergies  Allergen Reactions   Ace Inhibitors    Sulfamethoxazole-Trimethoprim Rash   Sulfur Rash      Review of Systems ROS Review of Systems - General ROS: negative for - chills, fatigue, fever, hot flashes, night sweats, weight gain or weight loss Psychological ROS: negative for - anxiety, decreased libido, depression, mood swings, physical abuse or sexual abuse Ophthalmic ROS: negative for - blurry vision, eye pain or loss of vision ENT ROS: negative for - headaches, hearing change, visual changes or vocal changes Allergy and Immunology ROS: negative for - hives, itchy/watery eyes or seasonal allergies Hematological and Lymphatic ROS: negative for -  bleeding problems, bruising, swollen lymph nodes or weight loss Endocrine ROS: negative for - galactorrhea, hair pattern changes, hot flashes, malaise/lethargy, mood swings, palpitations, polydipsia/polyuria, skin changes, temperature intolerance or unexpected weight changes Breast ROS: negative for - new or changing breast lumps or nipple discharge Respiratory ROS: negative for - cough or shortness of breath Cardiovascular ROS: negative for - chest pain, irregular heartbeat, palpitations or shortness of breath Gastrointestinal ROS: no abdominal pain, change in bowel habits, or black or bloody stools Genito-Urinary ROS: no dysuria, trouble  voiding, or hematuria Musculoskeletal ROS: negative for - joint pain or joint stiffness Neurological ROS: negative for - bowel and bladder control changes Dermatological ROS: negative for rash and skin lesion changes   Objective:   There were no vitals taken for this visit. CONSTITUTIONAL: Well-developed, well-nourished female in no acute distress.  PSYCHIATRIC: Normal mood and affect. Normal behavior. Normal judgment and thought content. [place]: Alert and oriented to person, place, and time. Normal muscle tone coordination. No cranial nerve deficit noted. HENT:  Normocephalic, atraumatic, External right and left ear normal. Oropharynx is clear and moist EYES: Conjunctivae and EOM are normal. Pupils are equal, round, and reactive to light. No scleral icterus.  NECK: Normal range of motion, supple, no masses.  Normal thyroid.  SKIN: Skin is warm and dry. No rash noted. Not diaphoretic. No erythema. No pallor. CARDIOVASCULAR: Normal heart rate noted, regular rhythm, no murmur. RESPIRATORY: Clear to auscultation bilaterally. Effort and breath sounds normal, no problems with respiration noted. BREASTS: Symmetric in size. No masses, skin changes, nipple drainage, or lymphadenopathy. ABDOMEN: Soft, normal bowel sounds, no distention noted.  No tenderness,  rebound or guarding.  BLADDER: Normal PELVIC:  Bladder no bladder distension noted             Urethra: normal appearing urethra with no masses, tenderness or lesions             Vulva: normal appearing vulva with no masses, tenderness or lesions             Vagina: normal appearing vagina with normal color and discharge, no lesions             Cervix: normal appearing cervix without discharge or lesions             Uterus: uterus is normal size, shape, consistency and nontender             Adnexa: normal adnexa in size, nontender and no masses             RV: External Exam NormaI, No Rectal Masses, and Normal Sphincter tone MUSCULOSKELETAL: Normal range of motion. No tenderness.  No cyanosis, clubbing, or edema.  2+ distal pulses. LYMPHATIC: No Axillary, Supraclavicular, or Inguinal Adenopathy.   Labs: Lab Results  Component Value Date   WBC 7.4 08/16/2021   HGB 15.0 08/16/2021   HCT 45.4 (H) 08/16/2021   MCV 84.1 08/16/2021   PLT 318 08/16/2021    Lab Results  Component Value Date   CREATININE 0.96 08/16/2021   BUN 20 08/16/2021   NA 139 08/16/2021   K 4.3 08/16/2021   CL 103 08/16/2021   CO2 31 08/16/2021    Lab Results  Component Value Date   ALT 16 08/16/2021   AST 16 08/16/2021   ALKPHOS 85 09/01/2018   BILITOT 0.4 08/16/2021    Lab Results  Component Value Date   CHOL 219 (H) 08/16/2021   HDL 52 08/16/2021   LDLCALC 140 (H) 08/16/2021   TRIG 148 08/16/2021   CHOLHDL 4.2 08/16/2021    Lab Results  Component Value Date   TSH 2.80 08/16/2021    Lab Results  Component Value Date   HGBA1C 5.9 (H) 08/16/2021     Assessment:   1. Encounter for well woman exam with routine gynecological exam   2. Encounter for screening mammogram for malignant neoplasm of breast   3. Cervical cancer screening   4. Pre-diabetes   5. Essential hypertension   6. Hyperlipidemia, unspecified hyperlipidemia type  7. Obesity (BMI 30-39.9)   8. History of HPV infection       Plan:  Pap:  up to date.  Mammogram: Ordered Colon Screening:   Up to date Labs: Up to date Routine preventative health maintenance measures emphasized: Exercise/Diet/Weight control, Tobacco Warnings, Alcohol/Substance use risks, and Stress Management Essential HTN, Prediabetes managed by PCP.  History of HPV, most recent pap smear normal.  Return to Clinic - 1 Year    Rubie Maid, MD [facility]

## 2022-05-24 ENCOUNTER — Ambulatory Visit
Admission: RE | Admit: 2022-05-24 | Discharge: 2022-05-24 | Disposition: A | Discharge status: Home / Self Care | Payer: BC Managed Care – PPO | Source: Ambulatory Visit | Attending: Obstetrics and Gynecology | Admitting: Obstetrics and Gynecology

## 2022-05-24 DIAGNOSIS — Z1231 Encounter for screening mammogram for malignant neoplasm of breast: Secondary | ICD-10-CM | POA: Insufficient documentation

## 2022-05-24 DIAGNOSIS — Z01419 Encounter for gynecological examination (general) (routine) without abnormal findings: Secondary | ICD-10-CM | POA: Diagnosis present

## 2022-06-20 ENCOUNTER — Ambulatory Visit: Payer: BC Managed Care – PPO | Admitting: Family Medicine

## 2022-06-20 ENCOUNTER — Encounter: Payer: Self-pay | Admitting: Family Medicine

## 2022-06-20 VITALS — BP 120/82 | HR 102 | Temp 97.6°F | Resp 20 | Ht 62.75 in | Wt 204.2 lb

## 2022-06-20 DIAGNOSIS — J029 Acute pharyngitis, unspecified: Secondary | ICD-10-CM | POA: Diagnosis not present

## 2022-06-20 MED ORDER — LIDOCAINE VISCOUS HCL 2 % MT SOLN: 5.0000 mL | Freq: Four times a day (QID) | OROMUCOSAL | 0 refills | Status: DC | PRN

## 2022-06-20 NOTE — Progress Notes (Signed)
Name: Karen Donaldson   MRN: [id number]    DOB: 1966-01-26   Date:06/20/2022       Progress Note  Subjective  Chief Complaint  Sore Throat/ Ears  HPI  URI: she states symptoms started on Thanksgiving with sore throat and it felt swollen, she tried to go to urgent care but everyone was closed. She states after that she had ear fullness and a couple of days ago developed a dry cough and facial congestion  She is taking otc cold medications for day and night time, mucinex lozenges. She states it helped with symptoms. She came in today because her throat is still sore , worse at night.   No fever , chills,  appetite is poor now.  She had mild diarrhea but secondary to  Miralax use   Patient Active Problem List   Diagnosis Date Noted   Menopausal vasomotor syndrome 03/14/2021   Pre-diabetes 08/17/2020   History of ischemic colitis 08/13/2019   CIN I (cervical intraepithelial neoplasia I) 02/22/2015   Benign essential HTN 02/06/2015   Dyslipidemia 02/06/2015   Blood glucose elevated 02/06/2015   Obesity (BMI 30-39.9) 02/06/2015   Perennial allergic rhinitis with seasonal variation 02/06/2015   Stress incontinence 02/06/2015   Vitamin D deficiency 02/06/2015    Past Surgical History:  Procedure Laterality Date   CESAREAN SECTION  2009, 2012   COLONOSCOPY WITH PROPOFOL N/A 02/03/2016   Procedure: COLONOSCOPY WITH PROPOFOL;  Surgeon: Lucilla Lame, MD;  Location: [place];  Service: Endoscopy;  Laterality: N/A;   TUBAL LIGATION      Family History  Problem Relation Age of Onset   Hypertension Mother    Diabetes Mother    Dementia Mother    Hypertension Father    Breast cancer Maternal Aunt    Ovarian cancer Neg Hx    Colon cancer Neg Hx    Heart disease Neg Hx     Social History   Tobacco Use   Smoking status: Never   Smokeless tobacco: Never  Substance Use Topics   Alcohol use: No    Alcohol/week: 0.0 standard drinks of alcohol     Current  Outpatient Medications:    Calcium Carbonate-Vitamin D 600-400 MG-UNIT per chew tablet, Chew 1 tablet by mouth daily., Disp: , Rfl:    cetirizine (ZYRTEC) 10 MG tablet, Take 10 mg by mouth daily., Disp: , Rfl:    diltiazem (CARDIZEM CD) 240 MG 24 hr capsule, Take 1 capsule (240 mg total) by mouth daily., Disp: 90 capsule, Rfl: 0   FIBER PO, Take by mouth daily as needed., Disp: , Rfl:    fluticasone (FLONASE) 50 MCG/ACT nasal spray, Place 2 sprays into both nostrils daily., Disp: 16 g, Rfl: 6   magic mouthwash (lidocaine, diphenhydrAMINE, alum & mag hydroxide) suspension, Swish and spit 5 mLs 4 (four) times daily as needed for mouth pain., Disp: 360 mL, Rfl: 0   MULTIPLE VITAMIN PO, Take 1 tablet by mouth daily., Disp: , Rfl:    triamterene-hydrochlorothiazide (MAXZIDE-25) 37.5-25 MG tablet, Take 0.5-1 tablets by mouth daily. In place of hctz for BP, Disp: 90 tablet, Rfl: 1  Allergies  Allergen Reactions   Ace Inhibitors    Misc. Sulfonamide Containing Compounds    Sulfamethoxazole-Trimethoprim Rash   Sulfur Rash    I personally reviewed active problem list, medication list, allergies, family history, social history, health maintenance with the patient/caregiver today.   ROS  Ten systems reviewed and is negative except as mentioned in HPI  Objective  Vitals:   06/20/22 1420  BP: 120/82  Pulse: (!) 102  Resp: 20  Temp: 97.6 F (36.4 C)  TempSrc: Oral  SpO2: 96%  Weight: 204 lb 3.2 oz (92.6 kg)  Height: 5' 2.75" (1.594 m)    Body mass index is 36.46 kg/m.  Physical Exam  Constitutional: Patient appears well-developed and well-nourished. Obese  No distress.  HEENT: head atraumatic, normocephalic, pupils equal and reactive to light, ears normal TM, neck supple, throat normal tonsils, uvula erythematous , no exudate  Cardiovascular: Normal rate, regular rhythm and normal heart sounds.  No murmur heard. No BLE edema. Pulmonary/Chest: Effort normal and breath sounds normal.  No respiratory distress. Abdominal: Soft.  There is no tenderness. Psychiatric: Patient has a normal mood and affect. behavior is normal. Judgment and thought content normal.    PHQ2/9:    06/20/2022    2:28 PM 03/30/2022    2:00 PM 02/16/2022   10:03 AM 08/16/2021    3:43 PM 01/05/2021    7:44 AM  Depression screen PHQ 2/9  Decreased Interest 0 0 0 0 0  Down, Depressed, Hopeless 0 0 0 0 0  PHQ - 2 Score 0 0 0 0 0  Altered sleeping 0  0 0   Tired, decreased energy 0  0 0   Change in appetite 0  0 0   Feeling bad or failure about yourself  0  0 0   Trouble concentrating 0  0 0   Moving slowly or fidgety/restless 0  0 0   Suicidal thoughts 0  0 0   PHQ-9 Score 0  0 0     phq 9 is negative   Fall Risk:    06/20/2022    2:28 PM 03/30/2022    2:00 PM 02/16/2022   10:01 AM 08/16/2021    3:42 PM 01/05/2021    7:44 AM  Fall Risk   Falls in the past year? 0 0 0 0 0  Number falls in past yr:  0 0  0  Injury with Fall?  0 0  0  Risk for fall due to : No Fall Risks No Fall Risks No Fall Risks    Follow up Falls prevention discussed;Education provided;Falls evaluation completed Falls evaluation completed Falls prevention discussed Falls prevention discussed       Functional Status Survey: Is the patient deaf or have difficulty hearing?: No Does the patient have difficulty seeing, even when wearing glasses/contacts?: No Does the patient have difficulty concentrating, remembering, or making decisions?: No Does the patient have difficulty walking or climbing stairs?: No Does the patient have difficulty dressing or bathing?: No Does the patient have difficulty doing errands alone such as visiting a doctor's office or shopping?: No    Assessment & Plan  1. Acute viral pharyngitis  - magic mouthwash (lidocaine, diphenhydrAMINE, alum & mag hydroxide) suspension; Swish and spit 5 mLs 4 (four) times daily as needed for mouth pain.  Dispense: 360 mL; Refill: 0

## 2022-08-04 ENCOUNTER — Other Ambulatory Visit: Payer: Self-pay | Admitting: Family Medicine

## 2022-08-04 DIAGNOSIS — I10 Essential (primary) hypertension: Secondary | ICD-10-CM

## 2022-08-06 ENCOUNTER — Other Ambulatory Visit: Payer: Self-pay

## 2022-08-06 DIAGNOSIS — I10 Essential (primary) hypertension: Secondary | ICD-10-CM

## 2022-08-06 MED ORDER — DILTIAZEM HCL ER COATED BEADS 240 MG PO CP24: 240.0000 mg | ORAL_CAPSULE | Freq: Every day | ORAL | 0 refills | Status: DC

## 2022-08-20 NOTE — Progress Notes (Unsigned)
Name: Karen Donaldson   MRN: [id number]    DOB: 04/09/1966   Date:08/21/2022       Progress Note  Subjective  Chief Complaint  Follow Up  HPI  RAD: she states usually has a cough and sometimes wheezing with weather changes. This round of symptoms started over one month ago, not proceeded by URI, just a cough that comes in spells, occasionally wheezing, no SOB, but no noticed some clear rhinorrhea and sneezing. She has been taking otc Alka seltzer plus   HTN: patient states she has been compliant with her medication, no chest pain, no palpitation, no edema but occasionally feels lightheaded. BP has been well controlled at home    History of colitis: seen at [facility], had CT abdomen 08/2018 and showed left side colitis, per Dr. Lars Pinks likely ischemic colitis. She is doing well at this time. No recurrence since first incident, no problems of constipation at this time - taking Miralax half cup intermittently Unchanged    Chronic constipation:  BM's have been  regular - she has a bowel movement about 2 times a week, [place] is  usually 4. No blood or mucus in stools. She takes Miralax prn only when bloated    History CIN I  with HPV: sees Dr. Valentino Saxon, had colposcopy in 03/2015, repeat pap smear was normal 02/2017, but positive HPV, she had endocervical curettage and was negative, she went back 02/2018 and had same findings, and biopsy also negative, repeat pap smear 02/2019 was also normal, 02/2020 normal pap smear , HPV positive for not high genotypes . Last pap smear was also normal 08/22. She is up to date with follow up    AR: taking Zyrtec daily and flonase prn but still has some symptoms and we will add singulair    Hyperlipidemia:she never took medications, she had ischemic colitis but not interested at this time   The 10-year ASCVD risk score (Arnett DK, et al., 2019) is: 5.1%   Values used to calculate the score:     Age: 57 years     Sex: Female     Is Non-Hispanic African American: Yes      Diabetic: No     Tobacco smoker: No     Systolic Blood Pressure: 122 mmHg     Is BP treated: Yes     HDL Cholesterol: 52 mg/dL     Total Cholesterol: 219 mg/dL   Pre-diabetes: she denies polyphagia, polyuria or polydipsia. Last hgbA1C was 5.9 %  she has family history of DM ( Mother ).   Morbid obesity: weight is down about 4 lbs over the past 6 months.  BMI above 35 with co-morbidities such as dyslipidemia, HTN and pre-diabetes   Patient Active Problem List   Diagnosis Date Noted   Menopausal vasomotor syndrome 03/14/2021   Pre-diabetes 08/17/2020   History of ischemic colitis 08/13/2019   CIN I (cervical intraepithelial neoplasia I) 02/22/2015   Benign essential HTN 02/06/2015   Dyslipidemia 02/06/2015   Blood glucose elevated 02/06/2015   Obesity (BMI 30-39.9) 02/06/2015   Perennial allergic rhinitis with seasonal variation 02/06/2015   Stress incontinence 02/06/2015   Vitamin D deficiency 02/06/2015    Past Surgical History:  Procedure Laterality Date   CESAREAN SECTION  2009, 2012   COLONOSCOPY WITH PROPOFOL N/A 02/03/2016   Procedure: COLONOSCOPY WITH PROPOFOL;  Surgeon: Midge Minium, MD;  Location: [facility];  Service: Endoscopy;  Laterality: N/A;   TUBAL LIGATION      Family  History  Problem Relation Age of Onset   Hypertension Mother    Diabetes Mother    Dementia Mother    Hypertension Father    Breast cancer Maternal Aunt    Ovarian cancer Neg Hx    Colon cancer Neg Hx    Heart disease Neg Hx     Social History   Tobacco Use   Smoking status: Never   Smokeless tobacco: Never  Substance Use Topics   Alcohol use: No    Alcohol/week: 0.0 standard drinks of alcohol     Current Outpatient Medications:    cetirizine (ZYRTEC) 10 MG tablet, Take 10 mg by mouth daily., Disp: , Rfl:    cholecalciferol (VITAMIN D3) 25 MCG (1000 UNIT) tablet, Take 1,000 Units by mouth daily., Disp: , Rfl:    diltiazem (CARDIZEM CD) 240 MG 24 hr capsule, Take 1  capsule (240 mg total) by mouth daily., Disp: 90 capsule, Rfl: 0   FIBER PO, Take by mouth daily as needed., Disp: , Rfl:    fluticasone (FLONASE) 50 MCG/ACT nasal spray, Place 2 sprays into both nostrils daily., Disp: 16 g, Rfl: 6   MULTIPLE VITAMIN PO, Take 1 tablet by mouth daily., Disp: , Rfl:    triamterene-hydrochlorothiazide (MAXZIDE-25) 37.5-25 MG tablet, Take 0.5-1 tablets by mouth daily. In place of hctz for BP, Disp: 90 tablet, Rfl: 1   magic mouthwash (lidocaine, diphenhydrAMINE, alum & mag hydroxide) suspension, Swish and spit 5 mLs 4 (four) times daily as needed for mouth pain. (Patient not taking: Reported on 08/21/2022), Disp: 360 mL, Rfl: 0  Allergies  Allergen Reactions   Ace Inhibitors    Misc. Sulfonamide Containing Compounds    Sulfamethoxazole-Trimethoprim Rash   Sulfur Rash    I personally reviewed active problem list, medication list, allergies, family history, social history, health maintenance with the patient/caregiver today.   ROS  Constitutional: Negative for fever or weight change.  Respiratory: Negative for cough and shortness of breath.   Cardiovascular: Negative for chest pain or palpitations.  Gastrointestinal: Negative for abdominal pain, no bowel changes.  Musculoskeletal: Negative for gait problem or joint swelling.  Skin: Negative for rash.  Neurological: Negative for dizziness or headache.  No other specific complaints in a complete review of systems (except as listed in HPI above).   Objective  Vitals:   08/21/22 1437  BP: 122/78  Pulse: 95  Resp: 16  SpO2: 95%  Weight: 205 lb (93 kg)  Height: 5\' 1"  (1.549 m)    Body mass index is 38.73 kg/m.  Physical Exam  Constitutional: Patient appears well-developed and well-nourished. Obese  No distress.  HEENT: head atraumatic, normocephalic, pupils equal and reactive to light, neck supple Cardiovascular: Normal rate, regular rhythm and normal heart sounds.  No murmur heard. No BLE  edema. Pulmonary/Chest: Effort normal and breath sounds normal. No respiratory distress. Abdominal: Soft.  There is no tenderness. Psychiatric: Patient has a normal mood and affect. behavior is normal. Judgment and thought content normal.   PHQ2/9:    08/21/2022    2:37 PM 06/20/2022    2:28 PM 03/30/2022    2:00 PM 02/16/2022   10:03 AM 08/16/2021    3:43 PM  Depression screen PHQ 2/9  Decreased Interest 0 0 0 0 0  Down, Depressed, Hopeless 0 0 0 0 0  PHQ - 2 Score 0 0 0 0 0  Altered sleeping 0 0  0 0  Tired, decreased energy 0 0  0 0  Change in  appetite 0 0  0 0  Feeling bad or failure about yourself  0 0  0 0  Trouble concentrating 0 0  0 0  Moving slowly or fidgety/restless 0 0  0 0  Suicidal thoughts 0 0  0 0  PHQ-9 Score 0 0  0 0    phq 9 is negative   Fall Risk:    08/21/2022    2:37 PM 06/20/2022    2:28 PM 03/30/2022    2:00 PM 02/16/2022   10:01 AM 08/16/2021    3:42 PM  Fall Risk   Falls in the past year? 0 0 0 0 0  Number falls in past yr: 0  0 0   Injury with Fall? 0  0 0   Risk for fall due to : No Fall Risks No Fall Risks No Fall Risks No Fall Risks   Follow up Falls prevention discussed Falls prevention discussed;Education provided;Falls evaluation completed Falls evaluation completed Falls prevention discussed Falls prevention discussed      Functional Status Survey: Is the patient deaf or have difficulty hearing?: No Does the patient have difficulty seeing, even when wearing glasses/contacts?: No Does the patient have difficulty concentrating, remembering, or making decisions?: No Does the patient have difficulty walking or climbing stairs?: No Does the patient have difficulty dressing or bathing?: No Does the patient have difficulty doing errands alone such as visiting a doctor's office or shopping?: No    Assessment & Plan  1. Benign essential HTN  - diltiazem (CARDIZEM CD) 240 MG 24 hr capsule; Take 1 capsule (240 mg total) by mouth daily.   Dispense: 90 capsule; Refill: 1 - triamterene-hydrochlorothiazide (MAXZIDE-25) 37.5-25 MG tablet; Take 0.5-1 tablets by mouth daily. In place of hctz for BP  Dispense: 90 tablet; Refill: 1  2. Vitamin D deficiency  Continue supplementation   3. History of ischemic colitis  Refuses statin   4. Pre-diabetes  Discussed diet   5. Dyslipidemia  Refuses medication  6. Morbid obesity ([place])  Discussed with the patient the risk posed by an increased BMI. Discussed importance of portion control, calorie counting and at least 150 minutes of physical activity weekly. Avoid sweet beverages and drink more water. Eat at least 6 servings of fruit and vegetables daily    7. Perennial allergic rhinitis with seasonal variation   8. RAD (reactive airway disease) with wheezing, mild persistent, uncomplicated  - montelukast (SINGULAIR) 10 MG tablet; Take 1 tablet (10 mg total) by mouth at bedtime.  Dispense: 90 tablet; Refill: 1 - budesonide-formoterol (SYMBICORT) 160-4.5 MCG/ACT inhaler; Inhale 2 puffs into the lungs 2 (two) times daily.  Dispense: 1 each; Refill: 2  9. Chronic constipation

## 2022-08-21 ENCOUNTER — Encounter: Payer: Self-pay | Admitting: Family Medicine

## 2022-08-21 ENCOUNTER — Ambulatory Visit: Payer: BC Managed Care – PPO | Admitting: Family Medicine

## 2022-08-21 VITALS — BP 122/78 | HR 95 | Resp 16 | Ht 61.0 in | Wt 205.0 lb

## 2022-08-21 DIAGNOSIS — J302 Other seasonal allergic rhinitis: Secondary | ICD-10-CM

## 2022-08-21 DIAGNOSIS — Z8719 Personal history of other diseases of the digestive system: Secondary | ICD-10-CM | POA: Diagnosis not present

## 2022-08-21 DIAGNOSIS — J3089 Other allergic rhinitis: Secondary | ICD-10-CM

## 2022-08-21 DIAGNOSIS — R7303 Prediabetes: Secondary | ICD-10-CM | POA: Diagnosis not present

## 2022-08-21 DIAGNOSIS — E559 Vitamin D deficiency, unspecified: Secondary | ICD-10-CM

## 2022-08-21 DIAGNOSIS — I10 Essential (primary) hypertension: Secondary | ICD-10-CM

## 2022-08-21 DIAGNOSIS — K5909 Other constipation: Secondary | ICD-10-CM

## 2022-08-21 DIAGNOSIS — E785 Hyperlipidemia, unspecified: Secondary | ICD-10-CM

## 2022-08-21 DIAGNOSIS — J4530 Mild persistent asthma, uncomplicated: Secondary | ICD-10-CM

## 2022-08-21 MED ORDER — BUDESONIDE-FORMOTEROL FUMARATE 160-4.5 MCG/ACT IN AERO: 2.0000 | INHALATION_SPRAY | Freq: Two times a day (BID) | RESPIRATORY_TRACT | 2 refills | Status: DC

## 2022-08-21 MED ORDER — TRIAMTERENE-HCTZ 37.5-25 MG PO TABS: 0.5000 | ORAL_TABLET | Freq: Every day | ORAL | 1 refills | Status: DC

## 2022-08-21 MED ORDER — MONTELUKAST SODIUM 10 MG PO TABS: 10.0000 mg | ORAL_TABLET | Freq: Every day | ORAL | 1 refills | Status: DC

## 2022-08-21 MED ORDER — DILTIAZEM HCL ER COATED BEADS 240 MG PO CP24: 240.0000 mg | ORAL_CAPSULE | Freq: Every day | ORAL | 1 refills | Status: DC

## 2022-10-22 ENCOUNTER — Encounter: Payer: Self-pay | Admitting: Family Medicine

## 2022-10-23 ENCOUNTER — Other Ambulatory Visit: Payer: Self-pay | Admitting: Family Medicine

## 2022-10-23 DIAGNOSIS — R052 Subacute cough: Secondary | ICD-10-CM

## 2022-10-23 MED ORDER — AZITHROMYCIN 250 MG PO TABS: ORAL_TABLET | ORAL | 0 refills | Status: AC

## 2022-10-30 ENCOUNTER — Other Ambulatory Visit: Payer: Self-pay

## 2022-10-30 DIAGNOSIS — R059 Cough, unspecified: Secondary | ICD-10-CM

## 2022-11-21 ENCOUNTER — Ambulatory Visit (INDEPENDENT_AMBULATORY_CARE_PROVIDER_SITE_OTHER): Payer: BC Managed Care – PPO

## 2022-11-21 ENCOUNTER — Encounter: Payer: Self-pay | Admitting: Pulmonary Disease

## 2022-11-21 ENCOUNTER — Ambulatory Visit: Payer: BC Managed Care – PPO | Admitting: Pulmonary Disease

## 2022-11-21 VITALS — BP 126/88 | HR 73 | Ht 61.0 in | Wt 201.6 lb

## 2022-11-21 DIAGNOSIS — R053 Chronic cough: Secondary | ICD-10-CM

## 2022-11-21 MED ORDER — PREDNISONE 20 MG PO TABS: 20.0000 mg | ORAL_TABLET | Freq: Every day | ORAL | 0 refills | Status: AC

## 2022-11-21 MED ORDER — PANTOPRAZOLE SODIUM 40 MG PO TBEC: 40.0000 mg | DELAYED_RELEASE_TABLET | Freq: Every day | ORAL | 2 refills | Status: DC

## 2022-11-21 NOTE — Progress Notes (Signed)
@Patient  ID: Karen Donaldson, female    DOB: 04-06-1966, 57 y.o.   MRN: [id number]  Chief Complaint  Patient presents with  . Consult    Referring provider: Alba Cory, MD  HPI:   57 y.o. woman whom we are seeing for evaluation of chronic cough.  Most recent PCP note x 3 reviewed.  Most recent OB/Gyn note reviewed.  Patient with cough for 3 to 4 years.  More like 4.  Largely daily.  Largely dry.  Worsened with seasonal changes and temperature fluctuations.  Some atopic symptoms.  Worse at night when lying down.  Sometimes antibiotics helps sometimes not.  Recently not.  Never had prednisone.  Prescribed Symbicort earlier this year.  She does think it helps some, mild relief of symptoms.  Coworkers think the same.  No recent chest imaging.  She does also report issues with urinary incontinence.  When she coughs she has fairly large volume leakage of urine it sounds like.  Questionaires / Pulmonary Flowsheets:   ACT:      No data to display          MMRC:     No data to display          Epworth:      No data to display          Tests:   FENO:  No results found for: "NITRICOXIDE"  PFT:     No data to display          WALK:      No data to display          Imaging: No results found.  Lab Results: Personally reviewed CBC    Component Value Date/Time   WBC 7.4 08/16/2021 1607   RBC 5.40 (H) 08/16/2021 1607   HGB 15.0 08/16/2021 1607   HCT 45.4 (H) 08/16/2021 1607   PLT 318 08/16/2021 1607   MCV 84.1 08/16/2021 1607   MCH 27.8 08/16/2021 1607   MCHC 33.0 08/16/2021 1607   RDW 13.8 08/16/2021 1607   LYMPHSABS 2,805 08/16/2021 1607   EOSABS 118 08/16/2021 1607   BASOSABS 52 08/16/2021 1607    BMET    Component Value Date/Time   NA 139 08/16/2021 1607   NA 142 02/08/2015 1012   K 4.3 08/16/2021 1607   CL 103 08/16/2021 1607   CO2 31 08/16/2021 1607   GLUCOSE 84 08/16/2021 1607   BUN 20 08/16/2021 1607   BUN 17  02/08/2015 1012   CREATININE 0.96 08/16/2021 1607   CALCIUM 10.0 08/16/2021 1607   GFRNONAA 72 08/15/2020 1630   GFRAA 83 08/15/2020 1630    BNP No results found for: "BNP"  ProBNP No results found for: "PROBNP"  Specialty Problems       Pulmonary Problems   Perennial allergic rhinitis with seasonal variation   RAD (reactive airway disease) with wheezing, mild persistent, uncomplicated    Allergies  Allergen Reactions  . Ace Inhibitors   . Misc. Sulfonamide Containing Compounds   . Sulfamethoxazole-Trimethoprim Rash  . Sulfur Rash    Immunization History  Administered Date(s) Administered  . Influenza,inj,Quad PF,6+ Mos 08/13/2016, 08/14/2017, 04/14/2019, 08/15/2020, 08/16/2021  . Influenza-Unspecified 07/28/2012  . PFIZER Comirnaty(Gray Top)Covid-19 Tri-Sucrose Vaccine 10/09/2019  . PFIZER(Purple Top)SARS-COV-2 Vaccination 09/18/2019, 05/30/2020  . Pneumococcal Polysaccharide-23 01/18/2012  . Tdap 10/11/2009, 08/15/2020  . Zoster Recombinat (Shingrix) 01/05/2021, 03/30/2021    Past Medical History:  Diagnosis Date  . Abnormal Pap smear of cervix 01/2015   ascus/pos  .  Allergy   . Dyslipidemia   . High risk HPV infection   . Hyperglycemia   . Hypertension   . Hypertrophy, uterus   . Stress incontinence   . Vitamin D deficiency     Tobacco History: Social History   Tobacco Use  Smoking Status Never  Smokeless Tobacco Never   Counseling given: Not Answered   Continue to not smoke  Outpatient Encounter Medications as of 11/21/2022  Medication Sig  . budesonide-formoterol (SYMBICORT) 160-4.5 MCG/ACT inhaler Inhale 2 puffs into the lungs 2 (two) times daily.  . cetirizine (ZYRTEC) 10 MG tablet Take 10 mg by mouth daily.  . cholecalciferol (VITAMIN D3) 25 MCG (1000 UNIT) tablet Take 1,000 Units by mouth daily.  Marland Kitchen diltiazem (CARDIZEM CD) 240 MG 24 hr capsule Take 1 capsule (240 mg total) by mouth daily.  Marland Kitchen FIBER PO Take by mouth daily as needed.  .  fluticasone (FLONASE) 50 MCG/ACT nasal spray Place 2 sprays into both nostrils daily.  . montelukast (SINGULAIR) 10 MG tablet Take 1 tablet (10 mg total) by mouth at bedtime.  . MULTIPLE VITAMIN PO Take 1 tablet by mouth daily.  . pantoprazole (PROTONIX) 40 MG tablet Take 1 tablet (40 mg total) by mouth daily.  . predniSONE (DELTASONE) 20 MG tablet Take 1 tablet (20 mg total) by mouth daily with breakfast for 5 days.  Marland Kitchen triamterene-hydrochlorothiazide (MAXZIDE-25) 37.5-25 MG tablet Take 0.5-1 tablets by mouth daily. In place of hctz for BP   No facility-administered encounter medications on file as of 11/21/2022.     Review of Systems  Review of Systems  No chest pain exertion.  Orthopnea or PND.  Comprehensive review of systems otherwise negative. Physical Exam  BP 126/88 (BP Location: Right Arm, Patient Position: Sitting, Cuff Size: Normal)   Pulse 73   Ht 5\' 1"  (1.549 m)   Wt 201 lb 9.6 oz (91.4 kg)   SpO2 94%   BMI 38.09 kg/m   Wt Readings from Last 5 Encounters:  11/21/22 201 lb 9.6 oz (91.4 kg)  08/21/22 205 lb (93 kg)  06/20/22 204 lb 3.2 oz (92.6 kg)  03/30/22 210 lb (95.3 kg)  02/16/22 209 lb (94.8 kg)    BMI Readings from Last 5 Encounters:  11/21/22 38.09 kg/m  08/21/22 38.73 kg/m  06/20/22 36.46 kg/m  03/30/22 39.68 kg/m  02/16/22 39.49 kg/m     Physical Exam Sitting in chair, no acute distress Eyes: EOMI, no icterus Neck: Supple, no JVP Pulmonary: Clear, normal work of breathing Cardiovascular: Warm, no edema Abdomen: Nondistended, bowel sounds present MSK: No synovitis, no joint effusion Neuro: Normal gait, no weakness Psych: Normal mood, full affect   Assessment & Plan:   Cough: Present for years.  Associated seasonal allergies.  Atopic symptoms.  Worsens with seasonal changes, temperature changes.  Some mild improvement with Symbicort.  Mild improvement with over-the-counter antiacids.  Chest x-ray for further evaluation.  Escalate Symbicort  to [facility] to see if additional bronchodilation will help with possible bronchospasm/spasmodic cough.  Prednisone 20 mg for 5 days, if eliminates cough and this would strongly argue for asthma as culprit, this seems the most likely etiology to me.  Given improvement antiacids, also start pantoprazole 40 mg once daily 30 minutes before meal.  Hope to go back on medicine in the future given chronicity of the cough, present for years, advocate for being aggressive and attempt to relieve symptoms soon as possible.  Urinary incontinence: Likely pelvic floor weakness as it resolves after  coughing.  Some relatively large volume as well.  Advised her to reach out to her OB/GYN for further evaluation, pelvic floor PT etc.  Message to OB/GYN also sent by me.   Return in about 2 months (around 01/21/2023).   Karren Burly, MD 11/21/2022   This appointment required 60 minutes of patient care (this includes precharting, chart review, review of results, face-to-face care, etc.).

## 2022-11-21 NOTE — Patient Instructions (Signed)
It is nice to meet you  Your lungs sound clear  Out of abundance of caution I ordered a chest x-ray today to evaluate causes of cough  Since the Symbicort is helped some, please stop the Symbicort for now.  Use this new inhaler, Breztri.  2 puffs twice a day every day.  Rinse her mouth out with water after every use.  Each inhaler should last 30 days.  Hopefully this helps with the cough  To further help with the cough since the antiacid's have helped some, take a stronger antacid pantoprazole 1 tablet about 30 minutes before a meal once a day.  I prescribed prednisone to take 20 mg once a day for 5 days then stop.  If the cough goes away with this, it is a pretty good indication that asthma is the main problem.  Return to clinic in 2 months or sooner as needed with Dr. Judeth Horn

## 2022-11-27 ENCOUNTER — Other Ambulatory Visit: Payer: Self-pay | Admitting: Pulmonary Disease

## 2023-02-02 ENCOUNTER — Other Ambulatory Visit: Payer: Self-pay | Admitting: Family Medicine

## 2023-02-02 DIAGNOSIS — J4530 Mild persistent asthma, uncomplicated: Secondary | ICD-10-CM

## 2023-02-04 ENCOUNTER — Other Ambulatory Visit: Payer: Self-pay

## 2023-02-04 DIAGNOSIS — J4530 Mild persistent asthma, uncomplicated: Secondary | ICD-10-CM

## 2023-02-04 MED ORDER — MONTELUKAST SODIUM 10 MG PO TABS: 10.0000 mg | ORAL_TABLET | Freq: Every day | ORAL | 1 refills | Status: DC

## 2023-02-05 ENCOUNTER — Ambulatory Visit: Payer: BC Managed Care – PPO | Admitting: Pulmonary Disease

## 2023-02-05 ENCOUNTER — Encounter: Payer: Self-pay | Admitting: Pulmonary Disease

## 2023-02-05 VITALS — BP 126/82 | HR 76 | Ht 61.0 in | Wt 199.0 lb

## 2023-02-05 DIAGNOSIS — R053 Chronic cough: Secondary | ICD-10-CM

## 2023-02-05 MED ORDER — PANTOPRAZOLE SODIUM 40 MG PO TBEC: 40.0000 mg | DELAYED_RELEASE_TABLET | Freq: Every day | ORAL | 3 refills | Status: DC

## 2023-02-05 NOTE — Progress Notes (Signed)
@Patient  ID: Karen Donaldson, female    DOB: 07-20-1966, 57 y.o.   MRN: [id number]  Chief Complaint  Patient presents with   Follow-up    2 mo f/u on cough. States she still has a dry cough. Has not used any inhalers since July 8th.     Referring provider: Alba Cory, MD  HPI:   57 y.o. woman whom we are seeing for evaluation of chronic cough.  Most recent PCP note reviewed.  Returns for routine follow-up.  At last visit supply of prednisone and Markus Daft was started for concern of possible cough variant asthma.  In addition started daily PPI given dry cough concern for silent reflux.  Prednisone helped little bit but not significantly so.  Stopped inhaler about 10 days ago.  Cough unchanged.  Overall improved.  Still has dry cough but frequency which is improved.  Likely attributable PPI therapy given sustained response off of inhaler for a week and a half.  Chest x-ray last visit clear.  HPI at initial visit: Patient with cough for 3 to 4 years.  More like 4.  Largely daily.  Largely dry.  Worsened with seasonal changes and temperature fluctuations.  Some atopic symptoms.  Worse at night when lying down.  Sometimes antibiotics helps sometimes not.  Recently not.  Never had prednisone.  Prescribed Symbicort earlier this year.  She does think it helps some, mild relief of symptoms.  Coworkers think the same.  No recent chest imaging.  She does also report issues with urinary incontinence.  When she coughs she has fairly large volume leakage of urine it sounds like.  Questionaires / Pulmonary Flowsheets:   ACT:      No data to display          MMRC:     No data to display          Epworth:      No data to display          Tests:   FENO:  No results found for: "NITRICOXIDE"  PFT:     No data to display          WALK:      No data to display          Imaging: No results found.  Lab Results: Personally reviewed CBC    Component Value  Date/Time   WBC 7.4 08/16/2021 1607   RBC 5.40 (H) 08/16/2021 1607   HGB 15.0 08/16/2021 1607   HCT 45.4 (H) 08/16/2021 1607   PLT 318 08/16/2021 1607   MCV 84.1 08/16/2021 1607   MCH 27.8 08/16/2021 1607   MCHC 33.0 08/16/2021 1607   RDW 13.8 08/16/2021 1607   LYMPHSABS 2,805 08/16/2021 1607   EOSABS 118 08/16/2021 1607   BASOSABS 52 08/16/2021 1607    BMET    Component Value Date/Time   NA 139 08/16/2021 1607   NA 142 02/08/2015 1012   K 4.3 08/16/2021 1607   CL 103 08/16/2021 1607   CO2 31 08/16/2021 1607   GLUCOSE 84 08/16/2021 1607   BUN 20 08/16/2021 1607   BUN 17 02/08/2015 1012   CREATININE 0.96 08/16/2021 1607   CALCIUM 10.0 08/16/2021 1607   GFRNONAA 72 08/15/2020 1630   GFRAA 83 08/15/2020 1630    BNP No results found for: "BNP"  ProBNP No results found for: "PROBNP"  Specialty Problems       Pulmonary Problems   Perennial allergic rhinitis with seasonal variation  RAD (reactive airway disease) with wheezing, mild persistent, uncomplicated    Allergies  Allergen Reactions   Ace Inhibitors    Misc. Sulfonamide Containing Compounds    Sulfamethoxazole-Trimethoprim Rash   Sulfur Rash    Immunization History  Administered Date(s) Administered   Influenza,inj,Quad PF,6+ Mos 08/13/2016, 08/14/2017, 04/14/2019, 08/15/2020, 08/16/2021   Influenza-Unspecified 07/28/2012   PFIZER Comirnaty(Gray Top)Covid-19 Tri-Sucrose Vaccine 10/09/2019   PFIZER(Purple Top)SARS-COV-2 Vaccination 09/18/2019, 05/30/2020   Pneumococcal Polysaccharide-23 01/18/2012   Tdap 10/11/2009, 08/15/2020   Zoster Recombinant(Shingrix) 01/05/2021, 03/30/2021    Past Medical History:  Diagnosis Date   Abnormal Pap smear of cervix 01/2015   ascus/pos   Allergy    Dyslipidemia    High risk HPV infection    Hyperglycemia    Hypertension    Hypertrophy, uterus    Stress incontinence    Vitamin D deficiency     Tobacco History: Social History   Tobacco Use  Smoking  Status Never  Smokeless Tobacco Never   Counseling given: Not Answered   Continue to not smoke  Outpatient Encounter Medications as of 02/05/2023  Medication Sig   cetirizine (ZYRTEC) 10 MG tablet Take 10 mg by mouth daily.   cholecalciferol (VITAMIN D3) 25 MCG (1000 UNIT) tablet Take 1,000 Units by mouth daily.   diltiazem (CARDIZEM CD) 240 MG 24 hr capsule Take 1 capsule (240 mg total) by mouth daily.   FIBER PO Take by mouth daily as needed.   fluticasone (FLONASE) 50 MCG/ACT nasal spray Place 2 sprays into both nostrils daily.   montelukast (SINGULAIR) 10 MG tablet Take 1 tablet (10 mg total) by mouth at bedtime.   MULTIPLE VITAMIN PO Take 1 tablet by mouth daily.   triamterene-hydrochlorothiazide (MAXZIDE-25) 37.5-25 MG tablet Take 0.5-1 tablets by mouth daily. In place of hctz for BP   [DISCONTINUED] pantoprazole (PROTONIX) 40 MG tablet TAKE 1 TABLET BY MOUTH EVERY DAY   pantoprazole (PROTONIX) 40 MG tablet Take 1 tablet (40 mg total) by mouth daily.   [DISCONTINUED] budesonide-formoterol (SYMBICORT) 160-4.5 MCG/ACT inhaler Inhale 2 puffs into the lungs 2 (two) times daily.   [DISCONTINUED] montelukast (SINGULAIR) 10 MG tablet Take 1 tablet (10 mg total) by mouth at bedtime.   No facility-administered encounter medications on file as of 02/05/2023.     Review of Systems  Review of Systems  No chest pain exertion.  Orthopnea or PND.  Comprehensive review of systems otherwise negative. Physical Exam  BP 126/82   Pulse 76   Ht 5\' 1"  (1.549 m)   Wt 199 lb (90.3 kg)   SpO2 96%   BMI 37.60 kg/m   Wt Readings from Last 5 Encounters:  02/05/23 199 lb (90.3 kg)  11/21/22 201 lb 9.6 oz (91.4 kg)  08/21/22 205 lb (93 kg)  06/20/22 204 lb 3.2 oz (92.6 kg)  03/30/22 210 lb (95.3 kg)    BMI Readings from Last 5 Encounters:  02/05/23 37.60 kg/m  11/21/22 38.09 kg/m  08/21/22 38.73 kg/m  06/20/22 36.46 kg/m  03/30/22 39.68 kg/m     Physical Exam Sitting in chair,  no acute distress Eyes: EOMI, no icterus Neck: Supple, no JVP Pulmonary: Clear, normal work of breathing Cardiovascular: Warm, no edema Abdomen: Nondistended, bowel sounds present MSK: No synovitis, no joint effusion Neuro: Normal gait, no weakness Psych: Normal mood, full affect   Assessment & Plan:   Cough: Present for years.  Associated seasonal allergies.  Atopic symptoms.  Worsens with seasonal changes, temperature changes.  Some mild improvement  with Symbicort.  Mild improvement with over-the-counter antiacids.  PPI started at last visit with improvement in frequency of cough.  Chest x-ray clear.  No significant improvement with short course of prednisone.  Has since stopped Breztri with sustained improvement of cough.  Suspect reflux major contributor.  PPI refilled today.   Return in about 6 months (around 08/08/2023).   Karren Burly, MD 02/05/2023

## 2023-02-05 NOTE — Patient Instructions (Signed)
Okay to stay off inhalers  Continue pantoprazole, the antacid.  I suspect this is the main reason the frequency of cough is improved.  This was refilled for 1 year, 90-day supply at a time  Return to clinic in 6 months or sooner as needed with Dr. Judeth Horn

## 2023-02-22 NOTE — Progress Notes (Deleted)
Name: Karen Donaldson   MRN: [id number]    DOB: Nov 18, 1965   Date:02/22/2023       Progress Note  Subjective  Chief Complaint  Follow up  HPI  RAD: she states usually has a cough and sometimes wheezing with weather changes. This round of symptoms started over one month ago, not proceeded by URI, just a cough that comes in spells, occasionally wheezing, no SOB, but no noticed some clear rhinorrhea and sneezing. She has been taking otc Alka seltzer plus   HTN: patient states she has been compliant with her medication, no chest pain, no palpitation, no edema but occasionally feels lightheaded. BP has been well controlled at home    History of colitis: seen at [facility], had CT abdomen 08/2018 and showed left side colitis, per Dr. Lars Pinks likely ischemic colitis. She is doing well at this time. No recurrence since first incident, no problems of constipation at this time - taking Miralax half cup intermittently Unchanged    Chronic constipation:  BM's have been  regular - she has a bowel movement about 2 times a week, [place] is  usually 4. No blood or mucus in stools. She takes Miralax prn only when bloated    History CIN I  with HPV: sees Dr. Valentino Saxon, had colposcopy in 03/2015, repeat pap smear was normal 02/2017, but positive HPV, she had endocervical curettage and was negative, she went back 02/2018 and had same findings, and biopsy also negative, repeat pap smear 02/2019 was also normal, 02/2020 normal pap smear , HPV positive for not high genotypes . Last pap smear was also normal 08/22. She is up to date with follow up    AR: taking Zyrtec daily and flonase prn but still has some symptoms and we will add singulair    Hyperlipidemia:she never took medications, she had ischemic colitis but not interested at this time  Patient Active Problem List   Diagnosis Date Noted   RAD (reactive airway disease) with wheezing, mild persistent, uncomplicated 08/21/2022   Chronic constipation 08/21/2022    Menopausal vasomotor syndrome 03/14/2021   Pre-diabetes 08/17/2020   History of ischemic colitis 08/13/2019   CIN I (cervical intraepithelial neoplasia I) 02/22/2015   Benign essential HTN 02/06/2015   Dyslipidemia 02/06/2015   Blood glucose elevated 02/06/2015   Obesity (BMI 30-39.9) 02/06/2015   Perennial allergic rhinitis with seasonal variation 02/06/2015   Stress incontinence 02/06/2015   Vitamin D deficiency 02/06/2015    Past Surgical History:  Procedure Laterality Date   CESAREAN SECTION  2009, 2012   COLONOSCOPY WITH PROPOFOL N/A 02/03/2016   Procedure: COLONOSCOPY WITH PROPOFOL;  Surgeon: Midge Minium, MD;  Location: [facility];  Service: Endoscopy;  Laterality: N/A;   TUBAL LIGATION      Family History  Problem Relation Age of Onset   Hypertension Mother    Diabetes Mother    Dementia Mother    Hypertension Father    Breast cancer Maternal Aunt    Ovarian cancer Neg Hx    Colon cancer Neg Hx    Heart disease Neg Hx     Social History   Tobacco Use   Smoking status: Never   Smokeless tobacco: Never  Substance Use Topics   Alcohol use: No    Alcohol/week: 0.0 standard drinks of alcohol     Current Outpatient Medications:    cetirizine (ZYRTEC) 10 MG tablet, Take 10 mg by mouth daily., Disp: , Rfl:    cholecalciferol (VITAMIN D3) 25 MCG (1000  UNIT) tablet, Take 1,000 Units by mouth daily., Disp: , Rfl:    diltiazem (CARDIZEM CD) 240 MG 24 hr capsule, Take 1 capsule (240 mg total) by mouth daily., Disp: 90 capsule, Rfl: 1   FIBER PO, Take by mouth daily as needed., Disp: , Rfl:    fluticasone (FLONASE) 50 MCG/ACT nasal spray, Place 2 sprays into both nostrils daily., Disp: 16 g, Rfl: 6   montelukast (SINGULAIR) 10 MG tablet, Take 1 tablet (10 mg total) by mouth at bedtime., Disp: 90 tablet, Rfl: 1   MULTIPLE VITAMIN PO, Take 1 tablet by mouth daily., Disp: , Rfl:    pantoprazole (PROTONIX) 40 MG tablet, Take 1 tablet (40 mg total) by mouth daily.,  Disp: 90 tablet, Rfl: 3   triamterene-hydrochlorothiazide (MAXZIDE-25) 37.5-25 MG tablet, Take 0.5-1 tablets by mouth daily. In place of hctz for BP, Disp: 90 tablet, Rfl: 1  Allergies  Allergen Reactions   Ace Inhibitors    Misc. Sulfonamide Containing Compounds    Sulfamethoxazole-Trimethoprim Rash   Sulfur Rash    I personally reviewed {Reviewed:14835} with the patient/caregiver today.   ROS  ***  Objective  There were no vitals filed for this visit.  There is no height or weight on file to calculate BMI.  Physical Exam ***  No results found for this or any previous visit (from the past 2160 hour(s)).  Diabetic Foot Exam: Diabetic Foot Exam - Simple   No data filed    ***  PHQ2/9:    08/21/2022    2:37 PM 06/20/2022    2:28 PM 03/30/2022    2:00 PM 02/16/2022   10:03 AM 08/16/2021    3:43 PM  Depression screen PHQ 2/9  Decreased Interest 0 0 0 0 0  Down, Depressed, Hopeless 0 0 0 0 0  PHQ - 2 Score 0 0 0 0 0  Altered sleeping 0 0  0 0  Tired, decreased energy 0 0  0 0  Change in appetite 0 0  0 0  Feeling bad or failure about yourself  0 0  0 0  Trouble concentrating 0 0  0 0  Moving slowly or fidgety/restless 0 0  0 0  Suicidal thoughts 0 0  0 0  PHQ-9 Score 0 0  0 0    phq 9 is {gen pos [id number]} ***  Fall Risk:    08/21/2022    2:37 PM 06/20/2022    2:28 PM 03/30/2022    2:00 PM 02/16/2022   10:01 AM 08/16/2021    3:42 PM  Fall Risk   Falls in the past year? 0 0 0 0 0  Number falls in past yr: 0  0 0   Injury with Fall? 0  0 0   Risk for fall due to : No Fall Risks No Fall Risks No Fall Risks No Fall Risks   Follow up Falls prevention discussed Falls prevention discussed;Education provided;Falls evaluation completed Falls evaluation completed Falls prevention discussed Falls prevention discussed   ***   Functional Status Survey:   ***   Assessment & Plan  *** There are no diagnoses linked to this encounter.

## 2023-02-25 ENCOUNTER — Ambulatory Visit: Payer: BC Managed Care – PPO | Admitting: Family Medicine

## 2023-02-25 NOTE — Progress Notes (Unsigned)
Name: Karen Donaldson   MRN: [id number]    DOB: 10-Jun-1966   Date:02/26/2023       Progress Note  Subjective  Chief Complaint  Follow up   HPI  Chronic cough: she finally saw Pulmonologist and was diagnosed with GERD and given PPI, she states since started on medication cough has improved by about 80 %. Discussed other ways to improve GERD symptoms today   HTN: patient states she has been compliant with her medication, no chest pain, no palpitation, no edema , no longer having dizziness. BP has been at goal  with current regiment of cardizem 240 and triamterene hydrochlorothiazide    History of colitis: seen at [facility], had CT abdomen 08/2018 and showed left side colitis, per Dr. Lars Pinks likely ischemic colitis. She is doing well at this time. No recurrence since first incident   Chronic constipation:  BM's have been  regular - about 4-5 times a week  [place] is  usually 4. No blood or mucus in stools. She takes Miralax prn only when bloated and doing well    History CIN I  with HPV: sees Dr. Valentino Saxon, had colposcopy in 03/2015, repeat pap smear was normal 02/2017, but positive HPV, she had endocervical curettage and was negative, she went back 02/2018 and had same findings, and biopsy also negative, repeat pap smear 02/2019 was also normal, 02/2020 normal pap smear , HPV positive for not high genotypes . Last pap smear was also normal 08/22.  She has regular follow ups with gyn    AR: taking Zyrtec and singulair prn and flonase prn but still has some symptoms , usually worse Fall and Spring    Hyperlipidemia:she never took medications, she had ischemic colitis but not interested at this time   The 10-year ASCVD risk score (Arnett DK, et al., 2019) is: 5.8%   Values used to calculate the score:     Age: 57 years     Sex: Female     Is Non-Hispanic African American: Yes     Diabetic: No     Tobacco smoker: No     Systolic Blood Pressure: 124 mmHg     Is BP treated: Yes     HDL Cholesterol: 52  mg/dL     Total Cholesterol: 219 mg/dL   Pre-diabetes: she denies polyphagia, polyuria or polydipsia. Last hgbA1C was 5.9 %  she has family history of DM ( Mother ).   Morbid obesity: weight is down about 4 lbs over the past 6 months.  BMI above 35 with co-morbidities such as dyslipidemia, HTN and pre-diabetes   Patient Active Problem List   Diagnosis Date Noted   RAD (reactive airway disease) with wheezing, mild persistent, uncomplicated 08/21/2022   Chronic constipation 08/21/2022   Menopausal vasomotor syndrome 03/14/2021   Pre-diabetes 08/17/2020   History of ischemic colitis 08/13/2019   CIN I (cervical intraepithelial neoplasia I) 02/22/2015   Benign essential HTN 02/06/2015   Dyslipidemia 02/06/2015   Blood glucose elevated 02/06/2015   Obesity (BMI 30-39.9) 02/06/2015   Perennial allergic rhinitis with seasonal variation 02/06/2015   Stress incontinence 02/06/2015   Vitamin D deficiency 02/06/2015    Past Surgical History:  Procedure Laterality Date   CESAREAN SECTION  2009, 2012   COLONOSCOPY WITH PROPOFOL N/A 02/03/2016   Procedure: COLONOSCOPY WITH PROPOFOL;  Surgeon: Midge Minium, MD;  Location: [facility];  Service: Endoscopy;  Laterality: N/A;   TUBAL LIGATION      Family History  Problem Relation  Age of Onset   Hypertension Mother    Diabetes Mother    Dementia Mother    Hypertension Father    Breast cancer Maternal Aunt    Ovarian cancer Neg Hx    Colon cancer Neg Hx    Heart disease Neg Hx     Social History   Tobacco Use   Smoking status: Never   Smokeless tobacco: Never  Substance Use Topics   Alcohol use: No    Alcohol/week: 0.0 standard drinks of alcohol     Current Outpatient Medications:    cetirizine (ZYRTEC) 10 MG tablet, Take 10 mg by mouth daily., Disp: , Rfl:    cholecalciferol (VITAMIN D3) 25 MCG (1000 UNIT) tablet, Take 1,000 Units by mouth daily., Disp: , Rfl:    FIBER PO, Take by mouth daily as needed., Disp: , Rfl:     fluticasone (FLONASE) 50 MCG/ACT nasal spray, Place 2 sprays into both nostrils daily., Disp: 16 g, Rfl: 6   montelukast (SINGULAIR) 10 MG tablet, Take 1 tablet (10 mg total) by mouth at bedtime., Disp: 90 tablet, Rfl: 1   MULTIPLE VITAMIN PO, Take 1 tablet by mouth daily., Disp: , Rfl:    pantoprazole (PROTONIX) 40 MG tablet, Take 1 tablet (40 mg total) by mouth daily., Disp: 90 tablet, Rfl: 3   diltiazem (CARDIZEM CD) 240 MG 24 hr capsule, Take 1 capsule (240 mg total) by mouth daily., Disp: 90 capsule, Rfl: 1   triamterene-hydrochlorothiazide (MAXZIDE-25) 37.5-25 MG tablet, Take 0.5-1 tablets by mouth daily. In place of hctz for BP, Disp: 90 tablet, Rfl: 1  Allergies  Allergen Reactions   Ace Inhibitors    Misc. Sulfonamide Containing Compounds    Sulfamethoxazole-Trimethoprim Rash   Sulfur Rash    I personally reviewed active problem list, medication list, allergies, family history with the patient/caregiver today.   ROS  Ten systems reviewed and is negative except as mentioned in HPI    Objective  Vitals:   02/26/23 1431  BP: 124/84  Pulse: 89  Resp: 14  Temp: 97.9 F (36.6 C)  TempSrc: Oral  SpO2: 98%  Weight: 205 lb 3.2 oz (93.1 kg)  Height: 5\' 1"  (1.549 m)    Body mass index is 38.77 kg/m.  Physical Exam  Constitutional: Patient appears well-developed and well-nourished. Obese  No distress.  HEENT: head atraumatic, normocephalic, pupils equal and reactive to light, neck supple Cardiovascular: Normal rate, regular rhythm and normal heart sounds.  No murmur heard. No BLE edema. Pulmonary/Chest: Effort normal and breath sounds normal. No respiratory distress. Abdominal: Soft.  There is no tenderness. Psychiatric: Patient has a normal mood and affect. behavior is normal. Judgment and thought content normal.   PHQ2/9:    02/26/2023    2:34 PM 08/21/2022    2:37 PM 06/20/2022    2:28 PM 03/30/2022    2:00 PM 02/16/2022   10:03 AM  Depression screen PHQ 2/9   Decreased Interest 0 0 0 0 0  Down, Depressed, Hopeless 0 0 0 0 0  PHQ - 2 Score 0 0 0 0 0  Altered sleeping 0 0 0  0  Tired, decreased energy 0 0 0  0  Change in appetite 0 0 0  0  Feeling bad or failure about yourself  0 0 0  0  Trouble concentrating 0 0 0  0  Moving slowly or fidgety/restless 0 0 0  0  Suicidal thoughts 0 0 0  0  PHQ-9 Score 0 0  0  0    phq 9 is negative   Fall Risk:    02/26/2023    2:34 PM 08/21/2022    2:37 PM 06/20/2022    2:28 PM 03/30/2022    2:00 PM 02/16/2022   10:01 AM  Fall Risk   Falls in the past year? 0 0 0 0 0  Number falls in past yr:  0  0 0  Injury with Fall?  0  0 0  Risk for fall due to : No Fall Risks No Fall Risks No Fall Risks No Fall Risks No Fall Risks  Follow up Falls prevention discussed Falls prevention discussed Falls prevention discussed;Education provided;Falls evaluation completed Falls evaluation completed Falls prevention discussed     Functional Status Survey: Is the patient deaf or have difficulty hearing?: No Does the patient have difficulty seeing, even when wearing glasses/contacts?: No Does the patient have difficulty concentrating, remembering, or making decisions?: No Does the patient have difficulty walking or climbing stairs?: No Does the patient have difficulty dressing or bathing?: No Does the patient have difficulty doing errands alone such as visiting a doctor's office or shopping?: No    Assessment & Plan  1. Benign essential HTN  - diltiazem (CARDIZEM CD) 240 MG 24 hr capsule; Take 1 capsule (240 mg total) by mouth daily.  Dispense: 90 capsule; Refill: 1 - triamterene-hydrochlorothiazide (MAXZIDE-25) 37.5-25 MG tablet; Take 0.5-1 tablets by mouth daily. In place of hctz for BP  Dispense: 90 tablet; Refill: 1 - CBC with Differential/Platelet - COMPLETE METABOLIC PANEL WITH GFR  2. Morbid obesity (HCC)  Discussed with the patient the risk posed by an increased BMI. Discussed importance of portion  control, calorie counting and at least 150 minutes of physical activity weekly. Avoid sweet beverages and drink more water. Eat at least 6 servings of fruit and vegetables daily    3. Vitamin D deficiency  Continue supplements   4. History of ischemic colitis  Does not want to take statin  5. Pre-diabetes  - Hemoglobin A1c  6. Chronic constipation  Doing well now   7. Perennial allergic rhinitis with seasonal variation  Continue medications   8. Dyslipidemia  - Lipid panel  9. Gastroesophageal reflux disease without esophagitis  Only symptoms is cough  10. Chronic cough   Doing well on PPI

## 2023-02-26 ENCOUNTER — Ambulatory Visit (INDEPENDENT_AMBULATORY_CARE_PROVIDER_SITE_OTHER): Payer: BC Managed Care – PPO | Admitting: Family Medicine

## 2023-02-26 ENCOUNTER — Encounter: Payer: Self-pay | Admitting: Family Medicine

## 2023-02-26 DIAGNOSIS — E785 Hyperlipidemia, unspecified: Secondary | ICD-10-CM

## 2023-02-26 DIAGNOSIS — K5909 Other constipation: Secondary | ICD-10-CM

## 2023-02-26 DIAGNOSIS — R7303 Prediabetes: Secondary | ICD-10-CM

## 2023-02-26 DIAGNOSIS — I10 Essential (primary) hypertension: Secondary | ICD-10-CM

## 2023-02-26 DIAGNOSIS — J302 Other seasonal allergic rhinitis: Secondary | ICD-10-CM

## 2023-02-26 DIAGNOSIS — K219 Gastro-esophageal reflux disease without esophagitis: Secondary | ICD-10-CM

## 2023-02-26 DIAGNOSIS — J3089 Other allergic rhinitis: Secondary | ICD-10-CM

## 2023-02-26 DIAGNOSIS — Z8719 Personal history of other diseases of the digestive system: Secondary | ICD-10-CM

## 2023-02-26 DIAGNOSIS — E559 Vitamin D deficiency, unspecified: Secondary | ICD-10-CM

## 2023-02-26 DIAGNOSIS — R053 Chronic cough: Secondary | ICD-10-CM

## 2023-02-26 LAB — CBC WITH DIFFERENTIAL/PLATELET
Absolute Monocytes: 440 cells/uL (ref 200–950)
Basophils Absolute: 37 cells/uL (ref 0–200)
Basophils Relative: 0.6 %
Eosinophils Absolute: 87 cells/uL (ref 15–500)
Eosinophils Relative: 1.4 %
HCT: 46.4 % — ABNORMAL HIGH (ref 35.0–45.0)
Hemoglobin: 15.5 g/dL (ref 11.7–15.5)
Lymphs Abs: 2443 cells/uL (ref 850–3900)
MCH: 27.3 pg (ref 27.0–33.0)
MCHC: 33.4 g/dL (ref 32.0–36.0)
MCV: 81.8 fL (ref 80.0–100.0)
MPV: 12 fL (ref 7.5–12.5)
Monocytes Relative: 7.1 %
Neutro Abs: 3193 cells/uL (ref 1500–7800)
Neutrophils Relative %: 51.5 %
Platelets: 301 10*3/uL (ref 140–400)
RBC: 5.67 10*6/uL — ABNORMAL HIGH (ref 3.80–5.10)
RDW: 14 % (ref 11.0–15.0)
Total Lymphocyte: 39.4 %
WBC: 6.2 10*3/uL (ref 3.8–10.8)

## 2023-02-26 MED ORDER — TRIAMTERENE-HCTZ 37.5-25 MG PO TABS
0.5000 | ORAL_TABLET | Freq: Every day | ORAL | 1 refills | Status: DC
Start: 2023-02-26 — End: 2023-06-26

## 2023-02-26 MED ORDER — DILTIAZEM HCL ER COATED BEADS 240 MG PO CP24
240.0000 mg | ORAL_CAPSULE | Freq: Every day | ORAL | 1 refills | Status: DC
Start: 2023-02-26 — End: 2023-06-26

## 2023-02-26 NOTE — Patient Instructions (Signed)
Gastroesophageal Reflux Disease, Adult    Gastroesophageal reflux (GER) happens when acid from the stomach flows up into the tube that connects the mouth and the stomach (esophagus). Normally, food travels down the esophagus and stays in the stomach to be digested. However, when a person has GER, food and stomach acid sometimes move back up into the esophagus. If this becomes a more serious problem, the person may be diagnosed with a disease called gastroesophageal reflux disease (GERD). GERD occurs when the reflux:  Happens often.  Causes frequent or severe symptoms.  Causes problems such as damage to the esophagus.  When stomach acid comes in contact with the esophagus, the acid may cause inflammation in the esophagus. Over time, GERD may create small holes (ulcers) in the lining of the esophagus.  What are the causes?  This condition is caused by a problem with the muscle between the esophagus and the stomach (lower esophageal sphincter, or LES). Normally, the LES muscle closes after food passes through the esophagus to the stomach. When the LES is weakened or abnormal, it does not close properly, and that allows food and stomach acid to go back up into the esophagus.  The LES can be weakened by certain dietary substances, medicines, and medical conditions, including:  Tobacco use.  Pregnancy.  Having a hiatal hernia.  Alcohol use.  Certain foods and beverages, such as coffee, chocolate, onions, and peppermint.  What increases the risk?  You are more likely to develop this condition if you:  Have an increased body weight.  Have a connective tissue disorder.  Take NSAIDs, such as ibuprofen.  What are the signs or symptoms?  Symptoms of this condition include:  Heartburn.  Difficult or painful swallowing and the feeling of having a lump in the throat.  A bitter taste in the mouth.  Bad breath and having a large amount of saliva.  Having an upset or bloated stomach and belching.  Chest pain. Different conditions can  cause chest pain. Make sure you see your health care provider if you experience chest pain.  Shortness of breath or wheezing.  Ongoing (chronic) cough or a nighttime cough.  Wearing away of tooth enamel.  Weight loss.  How is this diagnosed?  This condition may be diagnosed based on a medical history and a physical exam. To determine if you have mild or severe GERD, your health care provider may also monitor how you respond to treatment. You may also have tests, including:  A test to examine your stomach and esophagus with a small camera (endoscopy).  A test that measures the acidity level in your esophagus.  A test that measures how much pressure is on your esophagus.  A barium swallow or modified barium swallow test to show the shape, size, and functioning of your esophagus.  How is this treated?  Treatment for this condition may vary depending on how severe your symptoms are. Your health care provider may recommend:  Changes to your diet.  Medicine.  Surgery.  The goal of treatment is to help relieve your symptoms and to prevent complications.  Follow these instructions at home:  Eating and drinking    Follow a diet as recommended by your health care provider. This may involve avoiding foods and drinks such as:  Coffee and tea, with or without caffeine.  Drinks that contain alcohol.  Energy drinks and sports drinks.  Carbonated drinks or sodas.  Chocolate and cocoa.  Peppermint and mint flavorings.  Garlic and onions.  Horseradish.  Spicy and acidic foods, including peppers, chili powder, curry powder, vinegar, hot sauces, and barbecue sauce.  Citrus fruit juices and citrus fruits, such as oranges, lemons, and limes.  Tomato-based foods, such as red sauce, chili, salsa, and pizza with red sauce.  Fried and fatty foods, such as donuts, french fries, potato chips, and high-fat dressings.  High-fat meats, such as hot dogs and fatty cuts of red and white meats, such as rib eye steak, sausage, ham, and  bacon.  High-fat dairy items, such as whole milk, butter, and cream cheese.  Eat small, frequent meals instead of large meals.  Avoid drinking large amounts of liquid with your meals.  Avoid eating meals during the 2-3 hours before bedtime.  Avoid lying down right after you eat.  Do not exercise right after you eat.  Lifestyle    Do not use any products that contain nicotine or tobacco. These products include cigarettes, chewing tobacco, and vaping devices, such as e-cigarettes. If you need help quitting, ask your health care provider.  Try to reduce your stress by using methods such as yoga or meditation. If you need help reducing stress, ask your health care provider.  If you are overweight, reduce your weight to an amount that is healthy for you. Ask your health care provider for guidance about a safe weight loss goal.  General instructions  Pay attention to any changes in your symptoms.  Take over-the-counter and prescription medicines only as told by your health care provider. Do not take aspirin, ibuprofen, or other NSAIDs unless your health care provider told you to take these medicines.  Wear loose-fitting clothing. Do not wear anything tight around your waist that causes pressure on your abdomen.  Raise (elevate) the head of your bed about 6 inches (15 cm). You can use a wedge to do this.  Avoid bending over if this makes your symptoms worse.  Keep all follow-up visits. This is important.  Contact a health care provider if:  You have:  New symptoms.  Unexplained weight loss.  Difficulty swallowing or it hurts to swallow.  Wheezing or a persistent cough.  A hoarse voice.  Your symptoms do not improve with treatment.  Get help right away if:  You have sudden pain in your arms, neck, jaw, teeth, or back.  You suddenly feel sweaty, dizzy, or light-headed.  You have chest pain or shortness of breath.  You vomit and the vomit is green, yellow, or black, or it looks like blood or coffee grounds.  You faint.  You  have stool that is red, bloody, or black.  You cannot swallow, drink, or eat.  These symptoms may represent a serious problem that is an emergency. Do not wait to see if the symptoms will go away. Get medical help right away. Call your local emergency services (911 in the [place] Do not drive yourself to the hospital.  Summary  Gastroesophageal reflux happens when acid from the stomach flows up into the esophagus. GERD is a disease in which the reflux happens often, causes frequent or severe symptoms, or causes problems such as damage to the esophagus.  Treatment for this condition may vary depending on how severe your symptoms are. Your health care provider may recommend diet and lifestyle changes, medicine, or surgery.  Contact a health care provider if you have new or worsening symptoms.  Take over-the-counter and prescription medicines only as told by your health care provider. Do not take aspirin, ibuprofen, or other NSAIDs  unless your health care provider told you to do so.  Keep all follow-up visits as told by your health care provider. This is important.  This information is not intended to replace advice given to you by your health care provider. Make sure you discuss any questions you have with your health care provider.  Document Revised: 01/16/2020 Document Reviewed: 01/18/2020  Elsevier Patient Education  2024 [employer].

## 2023-03-18 ENCOUNTER — Telehealth: Payer: Self-pay | Admitting: Obstetrics and Gynecology

## 2023-03-18 NOTE — Telephone Encounter (Signed)
This patient has been placed on call list with Dr. Valentino Saxon. I contacted the patient x2. No answer, I left voicemail for the patient to contacted out our office for scheduling.

## 2023-03-19 NOTE — Telephone Encounter (Signed)
No answer, I left voicemail for the patient to contacted out our office for scheduling. Multiple attempt to reach this patient have been unsuccessful.

## 2023-03-26 ENCOUNTER — Ambulatory Visit (INDEPENDENT_AMBULATORY_CARE_PROVIDER_SITE_OTHER): Payer: BC Managed Care – PPO | Admitting: Obstetrics and Gynecology

## 2023-03-26 ENCOUNTER — Encounter: Payer: Self-pay | Admitting: Obstetrics and Gynecology

## 2023-03-26 VITALS — BP 115/85 | HR 80 | Resp 169 | Ht 61.0 in | Wt 206.8 lb

## 2023-03-26 DIAGNOSIS — R7303 Prediabetes: Secondary | ICD-10-CM

## 2023-03-26 DIAGNOSIS — N951 Menopausal and female climacteric states: Secondary | ICD-10-CM

## 2023-03-26 DIAGNOSIS — Z1231 Encounter for screening mammogram for malignant neoplasm of breast: Secondary | ICD-10-CM

## 2023-03-26 DIAGNOSIS — Z01419 Encounter for gynecological examination (general) (routine) without abnormal findings: Secondary | ICD-10-CM | POA: Diagnosis not present

## 2023-03-26 DIAGNOSIS — I10 Essential (primary) hypertension: Secondary | ICD-10-CM

## 2023-03-26 DIAGNOSIS — E785 Hyperlipidemia, unspecified: Secondary | ICD-10-CM

## 2023-03-26 DIAGNOSIS — E669 Obesity, unspecified: Secondary | ICD-10-CM

## 2023-03-26 NOTE — Patient Instructions (Addendum)
Preventive Care 40-57 Years Old, Female Preventive care refers to lifestyle choices and visits with your health care provider that can promote health and wellness. Preventive care visits are also called wellness exams. What can I expect for my preventive care visit? Counseling Your health care provider may ask you questions about your: Medical history, including: Past medical problems. Family medical history. Pregnancy history. Current health, including: Menstrual cycle. Method of birth control. Emotional well-being. Home life and relationship well-being. Sexual activity and sexual health. Lifestyle, including: Alcohol, nicotine or tobacco, and drug use. Access to firearms. Diet, exercise, and sleep habits. Work and work environment. Sunscreen use. Safety issues such as seatbelt and bike helmet use. Physical exam Your health care provider will check your: Height and weight. These may be used to calculate your BMI (body mass index). BMI is a measurement that tells if you are at a healthy weight. Waist circumference. This measures the distance around your waistline. This measurement also tells if you are at a healthy weight and may help predict your risk of certain diseases, such as type 2 diabetes and high blood pressure. Heart rate and blood pressure. Body temperature. Skin for abnormal spots. What immunizations do I need?  Vaccines are usually given at various ages, according to a schedule. Your health care provider will recommend vaccines for you based on your age, medical history, and lifestyle or other factors, such as travel or where you work. What tests do I need? Screening Your health care provider may recommend screening tests for certain conditions. This may include: Lipid and cholesterol levels. Diabetes screening. This is done by checking your blood sugar (glucose) after you have not eaten for a while (fasting). Pelvic exam and Pap test. Hepatitis B test. Hepatitis C  test. HIV (human immunodeficiency virus) test. STI (sexually transmitted infection) testing, if you are at risk. Lung cancer screening. Colorectal cancer screening. Mammogram. Talk with your health care provider about when you should start having regular mammograms. This may depend on whether you have a family history of breast cancer. BRCA-related cancer screening. This may be done if you have a family history of breast, ovarian, tubal, or peritoneal cancers. Bone density scan. This is done to screen for osteoporosis. Talk with your health care provider about your test results, treatment options, and if necessary, the need for more tests. Follow these instructions at home: Eating and drinking  Eat a diet that includes fresh fruits and vegetables, whole grains, lean protein, and low-fat dairy products. Take vitamin and mineral supplements as recommended by your health care provider. Do not drink alcohol if: Your health care provider tells you not to drink. You are pregnant, may be pregnant, or are planning to become pregnant. If you drink alcohol: Limit how much you have to 0-1 drink a day. Know how much alcohol is in your drink. In the [place] one drink equals one 12 oz bottle of beer (355 mL), one 5 oz glass of wine (148 mL), or one 1 oz glass of hard liquor (44 mL). Lifestyle Brush your teeth every morning and night with fluoride toothpaste. Floss one time each day. Exercise for at least 30 minutes 5 or more days each week. Do not use any products that contain nicotine or tobacco. These products include cigarettes, chewing tobacco, and vaping devices, such as e-cigarettes. If you need help quitting, ask your health care provider. Do not use drugs. If you are sexually active, practice safe sex. Use a condom or other form of protection to   prevent STIs. If you do not wish to become pregnant, use a form of birth control. If you plan to become pregnant, see your health care provider for a  prepregnancy visit. Take aspirin only as told by your health care provider. Make sure that you understand how much to take and what form to take. Work with your health care provider to find out whether it is safe and beneficial for you to take aspirin daily. Find healthy ways to manage stress, such as: Meditation, yoga, or listening to music. Journaling. Talking to a trusted person. Spending time with friends and family. Minimize exposure to UV radiation to reduce your risk of skin cancer. Safety Always wear your seat belt while driving or riding in a vehicle. Do not drive: If you have been drinking alcohol. Do not ride with someone who has been drinking. When you are tired or distracted. While texting. If you have been using any mind-altering substances or drugs. Wear a helmet and other protective equipment during sports activities. If you have firearms in your house, make sure you follow all gun safety procedures. Seek help if you have been physically or sexually abused. What's next? Visit your health care provider once a year for an annual wellness visit. Ask your health care provider how often you should have your eyes and teeth checked. Stay up to date on all vaccines. This information is not intended to replace advice given to you by your health care provider. Make sure you discuss any questions you have with your health care provider. Document Revised: 01/04/2021 Document Reviewed: 01/04/2021 Elsevier Patient Education  2024 Elsevier Inc.  

## 2023-03-26 NOTE — Progress Notes (Signed)
ANNUAL PREVENTATIVE CARE GYNECOLOGY  ENCOUNTER NOTE  Subjective:       Karen Donaldson is a 57 y.o. menopausal [phone] female here for a routine annual gynecologic exam. The patient is sexually active. The patient is not taking hormone replacement therapy. Patient denies post-menopausal vaginal bleeding.  Did have one episode of bleeding earlier this year (January) but had not had a cycle in over a year. Hormone levels still confirmed patient was perimenopausal. The patient wears seatbelts: yes. The patient participates in regular exercise: yes. Has the patient ever been transfused or tattooed?: no. The patient reports that there is not domestic violence in her life.   Current complaints: 1.  None    Gynecologic History No LMP recorded. (Menstrual status: Perimenopausal). Contraception: tubal ligation Last Pap: 03/14/2021. Results were: normal. H/o abnormal pap (HPV+) in 2021.  Last mammogram: 05/24/2022. Results were: normal Last Colonoscopy: 02/03/2016. Results were: normal: 10 years Last Dexa Scan: Never had one.    Obstetric History OB History  Gravida Para Term Preterm AB Living  2 2 2     2   SAB IAB Ectopic Multiple Live Births          2    # Outcome Date GA Lbr Len/2nd Weight Sex Type Anes PTL Lv  2 Term 2012   6 lb (2.722 kg) M CS-LTranv   LIV  1 Term 2009   5 lb 2.1 oz (2.327 kg) M CS-LTranv   LIV    Past Medical History:  Diagnosis Date   Abnormal Pap smear of cervix 01/2015   ascus/pos   Allergy    Dyslipidemia    High risk HPV infection    Hyperglycemia    Hypertension    Hypertrophy, uterus    Stress incontinence    Vitamin D deficiency     Family History  Problem Relation Age of Onset   Hypertension Mother    Diabetes Mother    Dementia Mother    Hypertension Father    Breast cancer Maternal Aunt    Ovarian cancer Neg Hx    Colon cancer Neg Hx    Heart disease Neg Hx     Past Surgical History:  Procedure Laterality Date   CESAREAN  SECTION  2009, 2012   COLONOSCOPY WITH PROPOFOL N/A 02/03/2016   Procedure: COLONOSCOPY WITH PROPOFOL;  Surgeon: Midge Minium, MD;  Location: [facility];  Service: Endoscopy;  Laterality: N/A;   TUBAL LIGATION      Social History   Socioeconomic History   Marital status: Married    Spouse name: Merchant navy officer   Number of children: 2   Years of education: Not on file   Highest education level: Master's degree (e.g., MA, MS, MEng, MEd, MSW, MBA)  Occupational History   Not on file  Tobacco Use   Smoking status: Never   Smokeless tobacco: Never  Vaping Use   Vaping status: Never Used  Substance and Sexual Activity   Alcohol use: No    Alcohol/week: 0.0 standard drinks of alcohol   Drug use: No   Sexual activity: Yes    Partners: Male    Birth control/protection: Surgical  Other Topics Concern   Not on file  Social History Narrative   Not on file   Social Determinants of Health   Financial Resource Strain: Low Risk  (02/25/2023)   Overall Financial Resource Strain (CARDIA)    Difficulty of Paying Living Expenses: Not hard at all  Food Insecurity: No Food Insecurity (  02/25/2023)   Hunger Vital Sign    Worried About Running Out of Food in the Last Year: Never true    Ran Out of Food in the Last Year: Never true  Transportation Needs: Unmet Transportation Needs (02/25/2023)   PRAPARE - Administrator, Civil Service (Medical): Yes    Lack of Transportation (Non-Medical): No  Physical Activity: Insufficiently Active (02/25/2023)   Exercise Vital Sign    Days of Exercise per Week: 3 days    Minutes of Exercise per Session: 30 min  Stress: No Stress Concern Present (02/25/2023)   [employer] of Occupational Health - Occupational Stress Questionnaire    Feeling of Stress : Not at all  Social Connections: Socially Integrated (02/25/2023)   Social Connection and Isolation Panel [NHANES]    Frequency of Communication with Friends and Family: More than three times a week     Frequency of Social Gatherings with Friends and Family: Once a week    Attends Religious Services: 1 to 4 times per year    Active Member of [employer] or Organizations: Yes    Attends Engineer, structural: More than 4 times per year    Marital Status: Married  Catering manager Violence: Not At Risk (08/15/2020)   Humiliation, Afraid, Rape, and Kick questionnaire    Fear of Current or Ex-Partner: No    Emotionally Abused: No    Physically Abused: No    Sexually Abused: No    Current Outpatient Medications on File Prior to Visit  Medication Sig Dispense Refill   cetirizine (ZYRTEC) 10 MG tablet Take 10 mg by mouth daily.     cholecalciferol (VITAMIN D3) 25 MCG (1000 UNIT) tablet Take 1,000 Units by mouth daily.     diltiazem (CARDIZEM CD) 240 MG 24 hr capsule Take 1 capsule (240 mg total) by mouth daily. 90 capsule 1   FIBER PO Take by mouth daily as needed.     fluticasone (FLONASE) 50 MCG/ACT nasal spray Place 2 sprays into both nostrils daily. 16 g 6   montelukast (SINGULAIR) 10 MG tablet Take 1 tablet (10 mg total) by mouth at bedtime. 90 tablet 1   MULTIPLE VITAMIN PO Take 1 tablet by mouth daily.     pantoprazole (PROTONIX) 40 MG tablet Take 1 tablet (40 mg total) by mouth daily. 90 tablet 3   triamterene-hydrochlorothiazide (MAXZIDE-25) 37.5-25 MG tablet Take 0.5-1 tablets by mouth daily. In place of hctz for BP 90 tablet 1   No current facility-administered medications on file prior to visit.    Allergies  Allergen Reactions   Ace Inhibitors    Misc. Sulfonamide Containing Compounds    Sulfamethoxazole-Trimethoprim Rash   Sulfur Rash      Review of Systems ROS Review of Systems - General ROS: negative for - chills, fatigue, fever, hot flashes, night sweats, weight gain or weight loss Psychological ROS: negative for - anxiety, decreased libido, depression, mood swings, physical abuse or sexual abuse Ophthalmic ROS: negative for - blurry vision, eye pain or loss of  vision ENT ROS: negative for - headaches, hearing change, visual changes or vocal changes Allergy and Immunology ROS: negative for - hives, itchy/watery eyes or seasonal allergies Hematological and Lymphatic ROS: negative for - bleeding problems, bruising, swollen lymph nodes or weight loss Endocrine ROS: negative for - galactorrhea, hair pattern changes, hot flashes, malaise/lethargy, mood swings, palpitations, polydipsia/polyuria, skin changes, temperature intolerance or unexpected weight changes Breast ROS: negative for - new or changing breast lumps  or nipple discharge Respiratory ROS: negative for - cough or shortness of breath Cardiovascular ROS: negative for - chest pain, irregular heartbeat, palpitations or shortness of breath Gastrointestinal ROS: no abdominal pain, change in bowel habits, or black or bloody stools Genito-Urinary ROS: no dysuria, trouble voiding, or hematuria Musculoskeletal ROS: negative for - joint pain or joint stiffness Neurological ROS: negative for - bowel and bladder control changes Dermatological ROS: negative for rash and skin lesion changes   Objective:   BP 115/85   Pulse 80   Resp (!) 169   Ht 5\' 1"  (1.549 m)   Wt 206 lb 12.8 oz (93.8 kg)   BMI 39.07 kg/m  CONSTITUTIONAL: Well-developed, well-nourished female in no acute distress.  PSYCHIATRIC: Normal mood and affect. Normal behavior. Normal judgment and thought content. NEUROLGIC: Alert and oriented to person, place, and time. Normal muscle tone coordination. No cranial nerve deficit noted. HENT:  Normocephalic, atraumatic, External right and left ear normal. Oropharynx is clear and moist EYES: Conjunctivae and EOM are normal. Pupils are equal, round, and reactive to light. No scleral icterus.  NECK: Normal range of motion, supple, no masses.  Normal thyroid.  SKIN: Skin is warm and dry. No rash noted. Not diaphoretic. No erythema. No pallor. CARDIOVASCULAR: Normal heart rate noted, regular rhythm,  no murmur. RESPIRATORY: Clear to auscultation bilaterally. Effort and breath sounds normal, no problems with respiration noted. BREASTS: Symmetric in size. No masses, skin changes, nipple drainage, or lymphadenopathy. ABDOMEN: Soft, normal bowel sounds, no distention noted.  No tenderness, rebound or guarding.  BLADDER: Normal PELVIC: Deferred per patient request as she was not due for pap smear this year. Not having any issues.  MUSCULOSKELETAL: Normal range of motion. No tenderness.  No cyanosis, clubbing, or edema.  2+ distal pulses. LYMPHATIC: No Axillary, Supraclavicular, or Inguinal Adenopathy.   Labs: Lab Results  Component Value Date   WBC 6.2 02/26/2023   HGB 15.5 02/26/2023   HCT 46.4 (H) 02/26/2023   MCV 81.8 02/26/2023   PLT 301 02/26/2023    Lab Results  Component Value Date   CREATININE 1.02 02/26/2023   BUN 20 02/26/2023   NA 137 02/26/2023   K 4.0 02/26/2023   CL 102 02/26/2023   CO2 23 02/26/2023    Lab Results  Component Value Date   ALT 16 02/26/2023   AST 16 02/26/2023   ALKPHOS 85 09/01/2018   BILITOT 0.3 02/26/2023    Lab Results  Component Value Date   CHOL 252 (H) 02/26/2023   HDL 55 02/26/2023   LDLCALC 169 (H) 02/26/2023   TRIG 142 02/26/2023   CHOLHDL 4.6 02/26/2023    Lab Results  Component Value Date   TSH 2.80 08/16/2021    Lab Results  Component Value Date   HGBA1C 6.2 (H) 02/26/2023     Assessment:   1. Encounter for well woman exam with routine gynecological exam   2. Encounter for screening mammogram for malignant neoplasm of breast   3. Pre-diabetes   4. Essential hypertension   5. Hyperlipidemia, unspecified hyperlipidemia type   6. Obesity (BMI 30-39.9)   7. Perimenopausal      Plan:  Labs: Recently performed by PCP.  Pap: up to date. Due in 1 year.  Mammogram: Ordered.  Colon Screening:   Up to date Labs: Up to date Routine preventative health maintenance measures emphasized: Exercise/Diet/Weight control,  Tobacco Warnings, Alcohol/Substance use risks, and Stress Management Essential HTN, Prediabetes managed by PCP.  Perimenopausal, continue to monitor  for future episodes of bleeding.  Return to Clinic - 1 Year    Hildred Laser, MD [facility]

## 2023-05-06 ENCOUNTER — Other Ambulatory Visit: Payer: Self-pay | Admitting: Family Medicine

## 2023-05-06 DIAGNOSIS — J4530 Mild persistent asthma, uncomplicated: Secondary | ICD-10-CM

## 2023-05-28 ENCOUNTER — Encounter: Payer: Self-pay | Admitting: Radiology

## 2023-05-28 ENCOUNTER — Ambulatory Visit
Admission: RE | Admit: 2023-05-28 | Discharge: 2023-05-28 | Disposition: A | Discharge status: Home / Self Care | Payer: BC Managed Care – PPO | Source: Ambulatory Visit | Attending: Obstetrics and Gynecology | Admitting: Obstetrics and Gynecology

## 2023-05-28 DIAGNOSIS — Z01419 Encounter for gynecological examination (general) (routine) without abnormal findings: Secondary | ICD-10-CM | POA: Insufficient documentation

## 2023-05-28 DIAGNOSIS — Z1231 Encounter for screening mammogram for malignant neoplasm of breast: Secondary | ICD-10-CM | POA: Insufficient documentation

## 2023-06-19 NOTE — Progress Notes (Signed)
Name: Karen Donaldson   MRN: [id number]    DOB: 08-27-1965   Date:06/26/2023       Progress Note  Subjective  Chief Complaint  Chief Complaint  Patient presents with   Medical Management of Chronic Issues    HPI Discussed the use of AI scribe software for clinical note transcription with the patient, who gave verbal consent to proceed.  History of Present Illness   The patient, with a history of prediabetes, high cholesterol, and colitis, presents with a recurrent fever blister on the left upper lip. The blister, initially small, has not healed and is associated with pain. The patient has a history of similar blisters, but this is the first occurrence in this location.  The patient also reports a recent episode of gastroenteritis, affecting all but one family member, which resulted in a weight loss of five pounds. The patient has been struggling with appetite since the illness, but is managing to consume small amounts of food.  The patient's prediabetes has shown a slight increase in A1C from 5.9 to 6.2. The patient denies excessive thirst or frequent urination, but does admit to occasional thirst due to busy work schedule.  The patient's high cholesterol has also increased significantly, with LDL rising from 140 to 169. The patient admits to occasional fast food consumption due to family commitments, but is willing to make dietary changes to improve cholesterol levels.  The patient's colitis has been stable with no recent episodes. The patient's constipation is managed with as-needed MiraLAX, and the patient reports satisfactory bowel movements.  The patient's hypertension is well-controlled on Cardizem and Triamterene-HCTZ, with a recent blood pressure reading of 120/78. The patient denies any chest pain, palpitations, swelling, or shortness of breath.  The patient's chronic cough, attributed to reflux, has improved by approximately 90% since starting Pantoprazole.    The patient  also manages seasonal allergies with Zyrtec, Singulair, and occasional Flonase.  The patient has a history of CIN1 with HPV, but recent Pap smears have been normal. The patient denies any pain or bleeding after sex.  The patient's BMI is 37.92, but the patient has recently lost five pounds due to a bout of gastroenteritis. The patient is hopeful to maintain this weight loss.           Patient Active Problem List   Diagnosis Date Noted   RAD (reactive airway disease) with wheezing, mild persistent, uncomplicated 08/21/2022   Chronic constipation 08/21/2022   Menopausal vasomotor syndrome 03/14/2021   Pre-diabetes 08/17/2020   History of ischemic colitis 08/13/2019   CIN I (cervical intraepithelial neoplasia I) 02/22/2015   Benign essential HTN 02/06/2015   Dyslipidemia 02/06/2015   Blood glucose elevated 02/06/2015   Obesity (BMI 30-39.9) 02/06/2015   Perennial allergic rhinitis with seasonal variation 02/06/2015   Stress incontinence 02/06/2015   Vitamin D deficiency 02/06/2015    Past Surgical History:  Procedure Laterality Date   CESAREAN SECTION  2009, 2012   COLONOSCOPY WITH PROPOFOL N/A 02/03/2016   Procedure: COLONOSCOPY WITH PROPOFOL;  Surgeon: Midge Minium, MD;  Location: [facility];  Service: Endoscopy;  Laterality: N/A;   TUBAL LIGATION      Family History  Problem Relation Age of Onset   Hypertension Mother    Diabetes Mother    Dementia Mother    Hypertension Father    Breast cancer Maternal Aunt    Ovarian cancer Neg Hx    Colon cancer Neg Hx    Heart disease Neg Hx  Social History   Tobacco Use   Smoking status: Never   Smokeless tobacco: Never  Substance Use Topics   Alcohol use: No    Alcohol/week: 0.0 standard drinks of alcohol     Current Outpatient Medications:    cetirizine (ZYRTEC) 10 MG tablet, Take 10 mg by mouth daily., Disp: , Rfl:    cholecalciferol (VITAMIN D3) 25 MCG (1000 UNIT) tablet, Take 1,000 Units by mouth daily.,  Disp: , Rfl:    diltiazem (CARDIZEM CD) 240 MG 24 hr capsule, Take 1 capsule (240 mg total) by mouth daily., Disp: 90 capsule, Rfl: 1   FIBER PO, Take by mouth daily as needed., Disp: , Rfl:    fluticasone (FLONASE) 50 MCG/ACT nasal spray, Place 2 sprays into both nostrils daily., Disp: 16 g, Rfl: 6   montelukast (SINGULAIR) 10 MG tablet, TAKE 1 TABLET BY MOUTH EVERYDAY AT BEDTIME, Disp: 90 tablet, Rfl: 0   MULTIPLE VITAMIN PO, Take 1 tablet by mouth daily., Disp: , Rfl:    pantoprazole (PROTONIX) 40 MG tablet, Take 1 tablet (40 mg total) by mouth daily., Disp: 90 tablet, Rfl: 3   triamterene-hydrochlorothiazide (MAXZIDE-25) 37.5-25 MG tablet, Take 0.5-1 tablets by mouth daily. In place of hctz for BP, Disp: 90 tablet, Rfl: 1  Allergies  Allergen Reactions   Ace Inhibitors    Misc. Sulfonamide Containing Compounds    Sulfamethoxazole-Trimethoprim Rash   Sulfur Rash    I personally reviewed active problem list, medication list, allergies, family history with the patient/caregiver today.   ROS  Ten systems reviewed and is negative except as mentioned in HPI     Objective  Vitals:   06/26/23 1459  BP: 120/78  Pulse: 92  Resp: 16  SpO2: 98%  Weight: 200 lb 11.2 oz (91 kg)  Height: 5\' 1"  (1.549 m)    Body mass index is 37.92 kg/m.  Physical Exam  Constitutional: Patient appears well-developed and well-nourished. Obese  No distress.  HEENT: head atraumatic, normocephalic, pupils equal and reactive to light, neck supple Cardiovascular: Normal rate, regular rhythm and normal heart sounds.  No murmur heard. No BLE edema. Pulmonary/Chest: Effort normal and breath sounds normal. No respiratory distress. Abdominal: Soft.  There is no tenderness. Psychiatric: Patient has a normal mood and affect. behavior is normal. Judgment and thought content normal.    PHQ2/9:    06/26/2023    2:59 PM 03/26/2023    2:21 PM 02/26/2023    2:34 PM 08/21/2022    2:37 PM 06/20/2022    2:28 PM   Depression screen PHQ 2/9  Decreased Interest 0 0 0 0 0  Down, Depressed, Hopeless 0 0 0 0 0  PHQ - 2 Score 0 0 0 0 0  Altered sleeping 0  0 0 0  Tired, decreased energy 0  0 0 0  Change in appetite 0  0 0 0  Feeling bad or failure about yourself  0  0 0 0  Trouble concentrating 0  0 0 0  Moving slowly or fidgety/restless 0  0 0 0  Suicidal thoughts 0  0 0 0  PHQ-9 Score 0  0 0 0  Difficult doing work/chores Not difficult at all        phq 9 is negative  Fall Risk:    06/26/2023    2:54 PM 03/26/2023    2:21 PM 02/26/2023    2:34 PM 08/21/2022    2:37 PM 06/20/2022    2:28 PM  Fall Risk  Falls in the past year? 0 0 0 0 0  Number falls in past yr: 0 0  0   Injury with Fall? 0 0  0   Risk for fall due to : No Fall Risks No Fall Risks No Fall Risks No Fall Risks No Fall Risks  Follow up Falls prevention discussed;Education provided;Falls evaluation completed Falls evaluation completed Falls prevention discussed Falls prevention discussed Falls prevention discussed;Education provided;Falls evaluation completed     Functional Status Survey: Is the patient deaf or have difficulty hearing?: No Does the patient have difficulty seeing, even when wearing glasses/contacts?: No Does the patient have difficulty concentrating, remembering, or making decisions?: No Does the patient have difficulty walking or climbing stairs?: No Does the patient have difficulty dressing or bathing?: No Does the patient have difficulty doing errands alone such as visiting a doctor's office or shopping?: No    Assessment & Plan  Assessment and Plan    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 -Prescribe Valtrex 1g BID for 1 day for current episode and future outbreaks.  Prediabetes A1c increased from 5.9 to 6.2. -Continue lifestyle modifications, focusing on diet especially during the holiday season. -Check A1c at next visit.  Hyperlipidemia LDL increased from 140 to  169. -Encourage dietary modifications, particularly reducing fast food intake. -Check lipid panel at next visit.  Hypertension Well controlled on Cardizem and Triamterene-HCTZ. -Continue current medications. -Refill prescriptions for 6 months.  Gastroenteritis Recent episode in the family. -Advise on maintaining hydration and a bland diet until symptoms resolve.  Chronic Cough Improved by approximately 90% on Pantoprazole. -Continue Pantoprazole.  Allergic Rhinitis Seasonal symptoms controlled on Zyrtec, Singulair, and occasional Flonase. -Continue current regimen. -Refill Singulair for 90 days.  Obesity Recent weight loss of 5 pounds, current BMI 37.92 with comorbidities qualifying for morbid obesity  -Encourage continuation of healthy dietary habits and further weight loss.  Follow-up in 6 months. At that visit, check blood glucose and cholesterol levels.

## 2023-06-26 ENCOUNTER — Ambulatory Visit: Payer: BC Managed Care – PPO | Admitting: Family Medicine

## 2023-06-26 ENCOUNTER — Encounter: Payer: Self-pay | Admitting: Family Medicine

## 2023-06-26 VITALS — BP 120/78 | HR 92 | Resp 16 | Ht 61.0 in | Wt 200.7 lb

## 2023-06-26 DIAGNOSIS — J4530 Mild persistent asthma, uncomplicated: Secondary | ICD-10-CM | POA: Diagnosis not present

## 2023-06-26 DIAGNOSIS — B001 Herpesviral vesicular dermatitis: Secondary | ICD-10-CM

## 2023-06-26 DIAGNOSIS — I10 Essential (primary) hypertension: Secondary | ICD-10-CM | POA: Diagnosis not present

## 2023-06-26 DIAGNOSIS — Z23 Encounter for immunization: Secondary | ICD-10-CM

## 2023-06-26 MED ORDER — VALACYCLOVIR HCL 1 G PO TABS: 1000.0000 mg | ORAL_TABLET | Freq: Two times a day (BID) | ORAL | 0 refills | Status: AC

## 2023-06-26 MED ORDER — TRIAMTERENE-HCTZ 37.5-25 MG PO TABS: 0.5000 | ORAL_TABLET | Freq: Every day | ORAL | 1 refills | Status: DC

## 2023-06-26 MED ORDER — MONTELUKAST SODIUM 10 MG PO TABS: 10.0000 mg | ORAL_TABLET | Freq: Every day | ORAL | 0 refills | Status: DC

## 2023-06-26 MED ORDER — DILTIAZEM HCL ER COATED BEADS 240 MG PO CP24: 240.0000 mg | ORAL_CAPSULE | Freq: Every day | ORAL | 1 refills | Status: DC

## 2023-07-31 ENCOUNTER — Encounter: Payer: Self-pay | Admitting: Pulmonary Disease

## 2023-07-31 ENCOUNTER — Ambulatory Visit: Payer: 59 | Admitting: Pulmonary Disease

## 2023-07-31 VITALS — BP 120/88 | HR 74 | Ht 61.0 in | Wt 200.8 lb

## 2023-07-31 DIAGNOSIS — K219 Gastro-esophageal reflux disease without esophagitis: Secondary | ICD-10-CM

## 2023-07-31 DIAGNOSIS — R053 Chronic cough: Secondary | ICD-10-CM | POA: Diagnosis not present

## 2023-07-31 MED ORDER — PANTOPRAZOLE SODIUM 40 MG PO TBEC: 40.0000 mg | DELAYED_RELEASE_TABLET | Freq: Every day | ORAL | 4 refills | Status: DC

## 2023-07-31 NOTE — Progress Notes (Signed)
 @Patient  ID: Karen Donaldson, female    DOB: 04/04/1966, 58 y.o.   MRN: [id number]  Chief Complaint  Patient presents with   Follow-up    Chronic cough F/U visit    Referring provider: Sowles, Krichna, MD  HPI:   58 y.o. woman whom we are seeing for evaluation of chronic cough.  Most recent PCP note reviewed.  Returns for routine follow-up.  Symptoms well-controlled on PPI therapy.  Cough doing better.  No issues or concerns.  HPI at initial visit: Patient with cough for 3 to 4 years.  More like 4.  Largely daily.  Largely dry.  Worsened with seasonal changes and temperature fluctuations.  Some atopic symptoms.  Worse at night when lying down.  Sometimes antibiotics helps sometimes not.  Recently not.  Never had prednisone .  Prescribed Symbicort  earlier this year.  She does think it helps some, mild relief of symptoms.  Coworkers think the same.  No recent chest imaging.  She does also report issues with urinary incontinence.  When she coughs she has fairly large volume leakage of urine it sounds like.  Questionaires / Pulmonary Flowsheets:   ACT:      No data to display          MMRC:     No data to display          Epworth:      No data to display          Tests:   FENO:  No results found for: NITRICOXIDE  PFT:     No data to display          WALK:      No data to display          Imaging: No results found.  Lab Results: Personally reviewed CBC    Component Value Date/Time   WBC 6.2 02/26/2023 1511   RBC 5.67 (H) 02/26/2023 1511   HGB 15.5 02/26/2023 1511   HCT 46.4 (H) 02/26/2023 1511   PLT 301 02/26/2023 1511   MCV 81.8 02/26/2023 1511   MCH 27.3 02/26/2023 1511   MCHC 33.4 02/26/2023 1511   RDW 14.0 02/26/2023 1511   LYMPHSABS 2,443 02/26/2023 1511   EOSABS 87 02/26/2023 1511   BASOSABS 37 02/26/2023 1511    BMET    Component Value Date/Time   NA 137 02/26/2023 1511   NA 142 02/08/2015 1012   K 4.0 02/26/2023  1511   CL 102 02/26/2023 1511   CO2 23 02/26/2023 1511   GLUCOSE 84 02/26/2023 1511   BUN 20 02/26/2023 1511   BUN 17 02/08/2015 1012   CREATININE 1.02 02/26/2023 1511   CALCIUM 9.9 02/26/2023 1511   GFRNONAA 72 08/15/2020 1630   GFRAA 83 08/15/2020 1630    BNP No results found for: BNP  ProBNP No results found for: PROBNP  Specialty Problems       Pulmonary Problems   Perennial allergic rhinitis with seasonal variation   RAD (reactive airway disease) with wheezing, mild persistent, uncomplicated    Allergies  Allergen Reactions   Ace Inhibitors    Misc. Sulfonamide Containing Compounds    Sulfamethoxazole-Trimethoprim Rash   Sulfur Rash    Immunization History  Administered Date(s) Administered   Influenza, Seasonal, Injecte, Preservative Fre 06/26/2023   Influenza,inj,Quad PF,6+ Mos 08/13/2016, 08/14/2017, 04/14/2019, 08/15/2020, 08/16/2021   Influenza-Unspecified 07/28/2012   PFIZER Comirnaty(Gray Top)Covid-19 Tri-Sucrose Vaccine 10/09/2019   PFIZER(Purple Top)SARS-COV-2 Vaccination 09/18/2019, 05/30/2020   Pneumococcal Polysaccharide-23 01/18/2012  Tdap 10/11/2009, 08/15/2020   Zoster Recombinant(Shingrix) 01/05/2021, 03/30/2021    Past Medical History:  Diagnosis Date   Abnormal Pap smear of cervix 01/2015   ascus/pos   Allergy    Dyslipidemia    High risk HPV infection    Hyperglycemia    Hypertension    Hypertrophy, uterus    Stress incontinence    Vitamin D  deficiency     Tobacco History: Social History   Tobacco Use  Smoking Status Never  Smokeless Tobacco Never   Counseling given: Not Answered   Continue to not smoke  Outpatient Encounter Medications as of 07/31/2023  Medication Sig   cetirizine  (ZYRTEC ) 10 MG tablet Take 10 mg by mouth daily.   cholecalciferol (VITAMIN D3) 25 MCG (1000 UNIT) tablet Take 1,000 Units by mouth daily.   diltiazem  (CARDIZEM  CD) 240 MG 24 hr capsule Take 1 capsule (240 mg total) by mouth daily.    FIBER PO Take by mouth daily as needed.   fluticasone  (FLONASE ) 50 MCG/ACT nasal spray Place 2 sprays into both nostrils daily.   montelukast  (SINGULAIR ) 10 MG tablet Take 1 tablet (10 mg total) by mouth at bedtime.   MULTIPLE VITAMIN PO Take 1 tablet by mouth daily.   pantoprazole  (PROTONIX ) 40 MG tablet Take 1 tablet (40 mg total) by mouth daily.   triamterene -hydrochlorothiazide  (MAXZIDE-25) 37.5-25 MG tablet Take 0.5-1 tablets by mouth daily. In place of hctz for BP   [DISCONTINUED] pantoprazole  (PROTONIX ) 40 MG tablet Take 1 tablet (40 mg total) by mouth daily.   No facility-administered encounter medications on file as of 07/31/2023.     Review of Systems  Review of Systems  N/a Physical Exam  BP 120/88 (BP Location: Left Arm, Patient Position: Sitting, Cuff Size: Large)   Pulse 74   Ht 5' 1 (1.549 m)   Wt 200 lb 12.8 oz (91.1 kg)   LMP 08/21/2018   SpO2 95%   BMI 37.94 kg/m   Wt Readings from Last 5 Encounters:  07/31/23 200 lb 12.8 oz (91.1 kg)  06/26/23 200 lb 11.2 oz (91 kg)  03/26/23 206 lb 12.8 oz (93.8 kg)  02/26/23 205 lb 3.2 oz (93.1 kg)  02/05/23 199 lb (90.3 kg)    BMI Readings from Last 5 Encounters:  07/31/23 37.94 kg/m  06/26/23 37.92 kg/m  03/26/23 39.07 kg/m  02/26/23 38.77 kg/m  02/05/23 37.60 kg/m     Physical Exam Sitting in chair, no acute distress Eyes: EOMI, no icterus Neck: Supple, no JVP Pulmonary: Clear, normal work of breathing Cardiovascular: Warm, no edema Abdomen: Nondistended, bowel sounds present MSK: No synovitis, no joint effusion Neuro: Normal gait, no weakness Psych: Normal mood, full affect   Assessment & Plan:   Cough: Present for years.  Associated seasonal allergies.  Atopic symptoms.  Worsens with seasonal changes, temperature changes.  Some mild improvement with Symbicort .  Mild improvement with over-the-counter antiacids.  PPI started at last visit with improvement in frequency of cough.  Chest x-ray clear.   No significant improvement with short course of prednisone .  Has since stopped Breztri with sustained improvement of cough.  Suspect reflux major contributor.  PPI refilled today.  After shared decision making agreed that given marked improvement of cough on PPI, okay for PCP to manage PPI prescription in the future.   Return if symptoms worsen or fail to improve.   Donnice JONELLE Beals, MD 07/31/2023

## 2024-01-01 ENCOUNTER — Ambulatory Visit: Payer: Self-pay | Admitting: Family Medicine

## 2024-01-01 ENCOUNTER — Encounter: Payer: Self-pay | Admitting: Family Medicine

## 2024-01-01 DIAGNOSIS — E785 Hyperlipidemia, unspecified: Secondary | ICD-10-CM

## 2024-01-01 DIAGNOSIS — J302 Other seasonal allergic rhinitis: Secondary | ICD-10-CM

## 2024-01-01 DIAGNOSIS — R7303 Prediabetes: Secondary | ICD-10-CM | POA: Diagnosis not present

## 2024-01-01 DIAGNOSIS — E559 Vitamin D deficiency, unspecified: Secondary | ICD-10-CM

## 2024-01-01 DIAGNOSIS — Z23 Encounter for immunization: Secondary | ICD-10-CM

## 2024-01-01 DIAGNOSIS — I10 Essential (primary) hypertension: Secondary | ICD-10-CM

## 2024-01-01 DIAGNOSIS — J3089 Other allergic rhinitis: Secondary | ICD-10-CM

## 2024-01-01 DIAGNOSIS — Z8719 Personal history of other diseases of the digestive system: Secondary | ICD-10-CM

## 2024-01-01 DIAGNOSIS — K219 Gastro-esophageal reflux disease without esophagitis: Secondary | ICD-10-CM

## 2024-01-01 MED ORDER — MONTELUKAST SODIUM 10 MG PO TABS: 10.0000 mg | ORAL_TABLET | Freq: Every day | ORAL | 0 refills | Status: DC

## 2024-01-01 MED ORDER — TRIAMTERENE-HCTZ 37.5-25 MG PO TABS: 0.5000 | ORAL_TABLET | Freq: Every day | ORAL | 1 refills | Status: DC

## 2024-01-01 MED ORDER — DILTIAZEM HCL ER COATED BEADS 240 MG PO CP24: 240.0000 mg | ORAL_CAPSULE | Freq: Every day | ORAL | 1 refills | Status: DC

## 2024-01-01 NOTE — Progress Notes (Signed)
 Name: Karen Donaldson   MRN: [id number]    DOB: 03/31/1966   Date:01/01/2024       Progress Note  Subjective  Chief Complaint  Chief Complaint  Patient presents with   Medical Management of Chronic Issues   Discussed the use of AI scribe software for clinical note transcription with the patient, who gave verbal consent to proceed.  History of Present Illness Karen Donaldson is a 58 year old female with prediabetes, hypertension, dyslipidemia, GERD, and asthma who presents for a regular follow-up.  She has a history of prediabetes with an A1c that increased from 5.9 two years ago to 6.2 in August of last year. Despite this, she has not made dietary changes. She has a family history of diabetes and obesity, which increases her risk of developing diabetes.  She has hypertension and is currently taking triamterene  HCTZ 37.5/25 mg and diltiazem  240 mg once daily, with no reported side effects. She experiences daytime fatigue and nocturnal snoring but has not been evaluated for sleep apnea. Her husband also snores, and she sleeps in separate rooms. ESS today was 7  She has dyslipidemia, with her LDL cholesterol recorded at 169 mg/dL last year. She has a family history of high cholesterol but is not currently on any cholesterol-lowering medications.  Her GERD is managed with pantoprazole , which controls her symptoms of heartburn and indigestion. She has a history of ischemic colitis, with one episode in the past, but no recent occurrences.  She has asthma or reactive airway disease and takes montelukast  and Zyrtec  for allergies, which helps with her symptoms.   She has low vitamin D  levels and is working on [employer], with a current BMI of 38, aiming to reduce her weight to below 200 pounds.    The 10-year ASCVD risk score (Arnett DK, et al., 2019) is: 6.9%   Values used to calculate the score:     Age: 58 years     Sex: Female     Is Non-Hispanic African American: Yes      Diabetic: No     Tobacco smoker: No     Systolic Blood Pressure: 128 mmHg     Is BP treated: Yes     HDL Cholesterol: 55 mg/dL     Total Cholesterol: 252 mg/dL    Patient Active Problem List   Diagnosis Date Noted   RAD (reactive airway disease) with wheezing, mild persistent, uncomplicated 08/21/2022   Chronic constipation 08/21/2022   Menopausal vasomotor syndrome 03/14/2021   Pre-diabetes 08/17/2020   History of ischemic colitis 08/13/2019   CIN I (cervical intraepithelial neoplasia I) 02/22/2015   Benign essential HTN 02/06/2015   Dyslipidemia 02/06/2015   Blood glucose elevated 02/06/2015   Obesity (BMI 30-39.9) 02/06/2015   Perennial allergic rhinitis with seasonal variation 02/06/2015   Stress incontinence 02/06/2015   Vitamin D  deficiency 02/06/2015    Past Surgical History:  Procedure Laterality Date   CESAREAN SECTION  2009, 2012   COLONOSCOPY WITH PROPOFOL  N/A 02/03/2016   Procedure: COLONOSCOPY WITH PROPOFOL ;  Surgeon: Marnee Sink, MD;  Location: [facility];  Service: Endoscopy;  Laterality: N/A;   TUBAL LIGATION      Family History  Problem Relation Age of Onset   Hypertension Mother    Diabetes Mother    Dementia Mother    Hypertension Father    Breast cancer Maternal Aunt    Ovarian cancer Neg Hx    Colon cancer Neg Hx    Heart disease  Neg Hx     Social History   Tobacco Use   Smoking status: Never   Smokeless tobacco: Never  Substance Use Topics   Alcohol use: No    Alcohol/week: 0.0 standard drinks of alcohol     Current Outpatient Medications:    cetirizine  (ZYRTEC ) 10 MG tablet, Take 10 mg by mouth daily., Disp: , Rfl:    cholecalciferol (VITAMIN D3) 25 MCG (1000 UNIT) tablet, Take 1,000 Units by mouth daily., Disp: , Rfl:    diltiazem  (CARDIZEM  CD) 240 MG 24 hr capsule, Take 1 capsule (240 mg total) by mouth daily., Disp: 90 capsule, Rfl: 1   FIBER PO, Take by mouth daily as needed., Disp: , Rfl:    fluticasone  (FLONASE ) 50  MCG/ACT nasal spray, Place 2 sprays into both nostrils daily., Disp: 16 g, Rfl: 6   montelukast  (SINGULAIR ) 10 MG tablet, Take 1 tablet (10 mg total) by mouth at bedtime., Disp: 90 tablet, Rfl: 0   MULTIPLE VITAMIN PO, Take 1 tablet by mouth daily., Disp: , Rfl:    pantoprazole  (PROTONIX ) 40 MG tablet, Take 1 tablet (40 mg total) by mouth daily., Disp: 90 tablet, Rfl: 4   triamterene -hydrochlorothiazide  (MAXZIDE-25) 37.5-25 MG tablet, Take 0.5-1 tablets by mouth daily. In place of hctz for BP, Disp: 90 tablet, Rfl: 1  Allergies  Allergen Reactions   Ace Inhibitors    Misc. Sulfonamide Containing Compounds    Sulfamethoxazole-Trimethoprim Rash   Sulfur Rash    I personally reviewed active problem list, medication list, allergies, family history with the patient/caregiver today.   ROS  Ten systems reviewed and is negative except as mentioned in HPI    Objective Physical Exam Constitutional: Patient appears well-developed and well-nourished. Obese  No distress.  HEENT: head atraumatic, normocephalic, pupils equal and reactive to light, neck supple Cardiovascular: Normal rate, regular rhythm and normal heart sounds.  No murmur heard. No BLE edema. Pulmonary/Chest: Effort normal and breath sounds normal. No respiratory distress. Abdominal: Soft.  There is no tenderness. Psychiatric: Patient has a normal mood and affect. behavior is normal. Judgment and thought content normal.     Vitals:   01/01/24 1511  BP: 128/80  Pulse: 92  Resp: 16  SpO2: 98%  Weight: 201 lb 1.6 oz (91.2 kg)  Height: 5' 1 (1.549 m)    Body mass index is 38 kg/m.    PHQ2/9:    01/01/2024    3:09 PM 06/26/2023    2:59 PM 03/26/2023    2:21 PM 02/26/2023    2:34 PM 08/21/2022    2:37 PM  Depression screen PHQ 2/9  Decreased Interest 0 0 0 0 0  Down, Depressed, Hopeless 0 0 0 0 0  PHQ - 2 Score 0 0 0 0 0  Altered sleeping  0  0 0  Tired, decreased energy  0  0 0  Change in appetite  0  0 0  Feeling  bad or failure about yourself   0  0 0  Trouble concentrating  0  0 0  Moving slowly or fidgety/restless  0  0 0  Suicidal thoughts  0  0 0  PHQ-9 Score  0  0 0  Difficult doing work/chores  Not difficult at all       phq 9 is negative  Fall Risk:    01/01/2024    3:09 PM 06/26/2023    2:54 PM 03/26/2023    2:21 PM 02/26/2023    2:34 PM 08/21/2022  2:37 PM  Fall Risk   Falls in the past year? 0 0 0 0 0  Number falls in past yr: 0 0 0  0  Injury with Fall? 0 0 0  0  Risk for fall due to : No Fall Risks No Fall Risks No Fall Risks No Fall Risks No Fall Risks  Follow up Falls prevention discussed;Education provided;Falls evaluation completed Falls prevention discussed;Education provided;Falls evaluation completed Falls evaluation completed Falls prevention discussed Falls prevention discussed     Assessment & Plan Pre Diabetes A1c increased to 6.5 explained that if remains above 6.5 % or a fasting level above 126 the diagnosis will change to DM - Encouraged dietary modifications to reduce carbohydrate intake, such as switching to multigrain or Ezekiel bread and limiting desserts to once a week. - Advised increasing water  intake and reducing sweet beverages.  Morbid obesity- BMI over 35 with co-morbidities such as Prediabetes and dyslipidemia  BMI 38 indicates morbid obesity. Weight loss recommended to reduce risk of associated conditions. - Encouraged weight loss to achieve a BMI below 35, with a target weight below 200 pounds. - Advised avoiding fried foods to help manage cholesterol levels.  Dyslipidemia LDL cholesterol 169, high with family history of hypercholesterolemia. Prefers not to start medications despite risks. - Encouraged dietary changes to manage cholesterol levels, such as avoiding fried foods. - Discussed importance of managing cholesterol to prevent myocardial infarction and stroke.  Hypertension Blood pressure well-controlled at 128/80 mmHg on current  medications. - Continue triamterene  HCTZ and diltiazem .  Gastro-esophageal reflux disease (GERD) Pantoprazole  effectively controls symptoms. - Continue pantoprazole  for GERD management.  Allergic rhinitis with seasonal variation Managed with montelukast  and Zyrtec . Difficulty finding Zyrtec . - Refill montelukast  prescription. - Advised checking with [facility] pharmacy for over-the-counter Zyrtec  availability.  Ischemic colitis No recent episodes noted.

## 2024-03-25 NOTE — Progress Notes (Unsigned)
 PCP: Sowles, Krichna, MD   No chief complaint on file.   HPI:      Ms. Karen Donaldson is a 58 y.o. [id number] whose LMP was Patient's last menstrual period was 08/21/2018., presents today for her annual examination.  Her menses are absent???/ had perimenopausal labs last yr?? She {does:18564} have vasomotor sx.   Sex activity: {sex active: 315163}. She {does:18564} have vaginal dryness/pain/bleeding.  Last Pap: 03/14/21  Results were: no abnormalities  03/10/20 Results were NILM/POS HPV DNA. Hx of abn paps prior to with neg colpo bx ???? Hx of STDs: {STD hx:14358}  Last mammogram: 05/28/23 Results were: normal--routine follow-up in 12 months There is no FH of breast cancer. There is no FH of ovarian cancer. The patient {does:18564} do self-breast exams.  Colonoscopy: 2017 with Dr. Jinny,  Repeat due after 10 years.   Tobacco use: {tob:20664} Alcohol use: {Alcohol:11675} No drug use Exercise: {exercise:31265}  She {does:18564} get adequate calcium and Vitamin D  in her diet.  Labs with PCP.   Patient Active Problem List   Diagnosis Date Noted   Gastroesophageal reflux disease without esophagitis 01/01/2024   Morbid obesity (HCC) 01/01/2024   RAD (reactive airway disease) with wheezing, mild persistent, uncomplicated 08/21/2022   Chronic constipation 08/21/2022   Menopausal vasomotor syndrome 03/14/2021   Pre-diabetes 08/17/2020   History of ischemic colitis 08/13/2019   CIN I (cervical intraepithelial neoplasia I) 02/22/2015   Benign essential HTN 02/06/2015   Dyslipidemia 02/06/2015   Obesity (BMI 30-39.9) 02/06/2015   Perennial allergic rhinitis with seasonal variation 02/06/2015   Stress incontinence 02/06/2015   Vitamin D  deficiency 02/06/2015    Past Surgical History:  Procedure Laterality Date   CESAREAN SECTION  2009, 2012   COLONOSCOPY WITH PROPOFOL  N/A 02/03/2016   Procedure: COLONOSCOPY WITH PROPOFOL ;  Surgeon: Rogelia Jinny, MD;  Location: [facility];  Service: Endoscopy;  Laterality: N/A;   TUBAL LIGATION      Family History  Problem Relation Age of Onset   Hypertension Mother    Diabetes Mother    Dementia Mother    Hypertension Father    Breast cancer Maternal Aunt    Ovarian cancer Neg Hx    Colon cancer Neg Hx    Heart disease Neg Hx     Social History   Socioeconomic History   Marital status: Married    Spouse name: Merchant navy officer   Number of children: 2   Years of education: Not on file   Highest education level: Master's degree (e.g., MA, MS, MEng, MEd, MSW, MBA)  Occupational History   Not on file  Tobacco Use   Smoking status: Never   Smokeless tobacco: Never  Vaping Use   Vaping status: Never Used  Substance and Sexual Activity   Alcohol use: No    Alcohol/week: 0.0 standard drinks of alcohol   Drug use: No   Sexual activity: Yes    Partners: Male    Birth control/protection: Surgical  Other Topics Concern   Not on file  Social History Narrative   Not on file   Social Drivers of Health   Financial Resource Strain: Low Risk  (12/30/2023)   Overall Financial Resource Strain (CARDIA)    Difficulty of Paying Living Expenses: Not hard at all  Food Insecurity: No Food Insecurity (12/30/2023)   Hunger Vital Sign    Worried About Running Out of Food in the Last Year: Never true    Ran Out of Food in the Last Year: Never  true  Transportation Needs: No Transportation Needs (12/30/2023)   PRAPARE - Administrator, Civil Service (Medical): No    Lack of Transportation (Non-Medical): No  Physical Activity: Insufficiently Active (12/30/2023)   Exercise Vital Sign    Days of Exercise per Week: 2 days    Minutes of Exercise per Session: 30 min  Stress: No Stress Concern Present (12/30/2023)   [employer] of Occupational Health - Occupational Stress Questionnaire    Feeling of Stress : Not at all  Social Connections: Socially Integrated (12/30/2023)   Social Connection and Isolation Panel    Frequency of  Communication with Friends and Family: More than three times a week    Frequency of Social Gatherings with Friends and Family: More than three times a week    Attends Religious Services: 1 to 4 times per year    Active Member of [employer] or Organizations: Yes    Attends Banker Meetings: 1 to 4 times per year    Marital Status: Married  Catering manager Violence: Not At Risk (08/15/2020)   Humiliation, Afraid, Rape, and Kick questionnaire    Fear of Current or Ex-Partner: No    Emotionally Abused: No    Physically Abused: No    Sexually Abused: No     Current Outpatient Medications:    cetirizine  (ZYRTEC ) 10 MG tablet, Take 10 mg by mouth daily., Disp: , Rfl:    cholecalciferol (VITAMIN D3) 25 MCG (1000 UNIT) tablet, Take 1,000 Units by mouth daily., Disp: , Rfl:    diltiazem  (CARDIZEM  CD) 240 MG 24 hr capsule, Take 1 capsule (240 mg total) by mouth daily., Disp: 90 capsule, Rfl: 1   FIBER PO, Take by mouth daily as needed., Disp: , Rfl:    fluticasone  (FLONASE ) 50 MCG/ACT nasal spray, Place 2 sprays into both nostrils daily., Disp: 16 g, Rfl: 6   montelukast  (SINGULAIR ) 10 MG tablet, Take 1 tablet (10 mg total) by mouth at bedtime., Disp: 90 tablet, Rfl: 0   MULTIPLE VITAMIN PO, Take 1 tablet by mouth daily., Disp: , Rfl:    pantoprazole  (PROTONIX ) 40 MG tablet, Take 1 tablet (40 mg total) by mouth daily., Disp: 90 tablet, Rfl: 4   triamterene -hydrochlorothiazide  (MAXZIDE-25) 37.5-25 MG tablet, Take 0.5-1 tablets by mouth daily. In place of hctz for BP, Disp: 90 tablet, Rfl: 1     ROS:  Review of Systems BREAST: No symptoms    Objective: LMP 08/21/2018    OBGyn Exam  Results: No results found for this or any previous visit (from the past 24 hours).  Assessment/Plan:  No diagnosis found.   No orders of the defined types were placed in this encounter.           GYN counsel {counseling: 16159}    F/U  No follow-ups on file.  [name],  PA-C 03/25/2024 12:28 PM

## 2024-03-26 ENCOUNTER — Ambulatory Visit: Admitting: Obstetrics and Gynecology

## 2024-03-26 ENCOUNTER — Encounter: Payer: Self-pay | Admitting: Obstetrics and Gynecology

## 2024-03-26 ENCOUNTER — Other Ambulatory Visit (HOSPITAL_COMMUNITY)
Admission: RE | Admit: 2024-03-26 | Discharge: 2024-03-26 | Disposition: A | Discharge status: Home / Self Care | Source: Ambulatory Visit | Attending: Obstetrics and Gynecology | Admitting: Obstetrics and Gynecology

## 2024-03-26 VITALS — BP 111/71 | HR 77 | Ht 61.0 in | Wt 204.0 lb

## 2024-03-26 DIAGNOSIS — Z803 Family history of malignant neoplasm of breast: Secondary | ICD-10-CM

## 2024-03-26 DIAGNOSIS — Z1151 Encounter for screening for human papillomavirus (HPV): Secondary | ICD-10-CM

## 2024-03-26 DIAGNOSIS — Z1231 Encounter for screening mammogram for malignant neoplasm of breast: Secondary | ICD-10-CM

## 2024-03-26 DIAGNOSIS — Z124 Encounter for screening for malignant neoplasm of cervix: Secondary | ICD-10-CM

## 2024-03-26 DIAGNOSIS — Z8619 Personal history of other infectious and parasitic diseases: Secondary | ICD-10-CM | POA: Diagnosis not present

## 2024-03-26 DIAGNOSIS — Z01419 Encounter for gynecological examination (general) (routine) without abnormal findings: Secondary | ICD-10-CM

## 2024-03-26 NOTE — Patient Instructions (Signed)
 I value your feedback and you entrusting [place] with your care. If you get a [facility] patient survey, I would appreciate you taking the time to let us know about your experience today. Thank you!  [facility] ([place])--[phone]

## 2024-04-01 LAB — CYTOLOGY - PAP
Adequacy: ABSENT
Comment: NEGATIVE
Diagnosis: NEGATIVE
High risk HPV: POSITIVE — AB

## 2024-04-02 ENCOUNTER — Ambulatory Visit: Payer: Self-pay | Admitting: Obstetrics and Gynecology

## 2024-04-23 NOTE — Progress Notes (Signed)
 Referring Provider:  Bernarda Schroeder PA-C  HPI:  Karen Donaldson is a 58 y.o.  [id number]  who presents today for evaluation and management of abnormal cervical cytology.    Prior pap smears:  03/26/2024:  NILM HPV + (undiff) 03/14/2021:  NILM  03/10/2020:  NILM HPV + (undiff) 03/10/2019:  NILM 03/04/2018:  NILM HPV 16 + 02/26/2017:  NILM HPV + 02/23/2016:  NILM HPV + 02/08/2015:  NILM HPV +  Prior cervical / vaginal findings: Colposcopy 03/21/2017: Benign ECC 04/14/2015: CIN1, ECC benign  Prior cervical treatment(s): None  Symptoms/History:  -Abnormal vaginal discharge: none -Postmenopausal: yes -Intermenstrual bleeding: none -Postcoital bleeding: none -Bleeding problems (non-gyn): none -Contraception: postmenopausal -Number of current sexual partners: 1 -Number of partners in lifetime: 5 -History of a high risk partner: none -History of STDs: chlamydia in college -Smoking: none -Gardasil Vaccine: no      ROS:  Pertinent items are noted in HPI.  OB History  Gravida Para Term Preterm AB Living  2 2 2   2   SAB IAB Ectopic Multiple Live Births      2    # Outcome Date GA Lbr Len/2nd Weight Sex Type Anes PTL Lv  2 Term 2012   6 lb (2.722 kg) M CS-LTranv   LIV  1 Term 2009   5 lb 2.1 oz (2.327 kg) M CS-LTranv   LIV    Past Medical History:  Diagnosis Date   Abnormal Pap smear of cervix 01/2015   ascus/pos   Allergy    Dyslipidemia    High risk HPV infection    Hyperglycemia    Hypertension    Hypertrophy, uterus    Stress incontinence    Vitamin D  deficiency     Past Surgical History:  Procedure Laterality Date   CESAREAN SECTION  2009, 2012   COLONOSCOPY WITH PROPOFOL  N/A 02/03/2016   Procedure: COLONOSCOPY WITH PROPOFOL ;  Surgeon: Rogelia Copping, MD;  Location: [facility];  Service: Endoscopy;  Laterality: N/A;   TUBAL LIGATION      SOCIAL HISTORY:  Social History   Substance and Sexual Activity  Alcohol Use Yes   Comment: rare     Social History   Substance and Sexual Activity  Drug Use No     Family History  Problem Relation Age of Onset   Hypertension Mother    Diabetes Mother    Dementia Mother    Hypertension Father    Breast cancer Maternal Aunt 40   Brain cancer Maternal Aunt    Ovarian cancer Neg Hx    Colon cancer Neg Hx    Heart disease Neg Hx     ALLERGIES:  Ace inhibitors, Misc. sulfonamide containing compounds, Sulfamethoxazole-trimethoprim, and Sulfur  She has a current medication list which includes the following prescription(s): cetirizine , cholecalciferol, diltiazem , estradiol, fiber, fluticasone , misoprostol, montelukast , multiple vitamin, pantoprazole , and triamterene -hydrochlorothiazide .  Physical Exam: -Vitals:  BP (!) 148/92   Pulse 81   Wt 202 lb 9.6 oz (91.9 kg)   LMP 08/21/2018   BMI 38.28 kg/m   Speculum placed and cervix very high, cleaned with Betadine and grasped with a single-toothed tenaculum to hold in place. Attempted ECC, cervix stenosed. Used plastic dilators and unable to dilate.   ASSESSMENT:  Karen Donaldson is a 58 y.o. [id number] with NILM, but persistent HPV-HR positive undifferentiated but in 2019, was Type 16+. Unable to dilate cervix for ECC today. Offered a trial of medication to help cervix open, versus procedure in OR  and pt would like to attempt in-office w/medication.  -Rx for vaginal Estrace nightly x minimum of 2wks, and pre-procedure Cytotec sent in, dosing reviewed -Reschedule colposcopy after a minimum of 2wks    Estil Mangle, DO [facility]

## 2024-04-27 ENCOUNTER — Ambulatory Visit: Admitting: Obstetrics

## 2024-04-27 ENCOUNTER — Encounter: Payer: Self-pay | Admitting: Obstetrics

## 2024-04-27 VITALS — BP 148/92 | HR 81 | Wt 202.6 lb

## 2024-04-27 DIAGNOSIS — N882 Stricture and stenosis of cervix uteri: Secondary | ICD-10-CM

## 2024-04-27 DIAGNOSIS — R87618 Other abnormal cytological findings on specimens from cervix uteri: Secondary | ICD-10-CM

## 2024-04-27 DIAGNOSIS — R87810 Cervical high risk human papillomavirus (HPV) DNA test positive: Secondary | ICD-10-CM | POA: Diagnosis not present

## 2024-04-27 MED ORDER — ESTRADIOL 0.1 MG/GM VA CREA: 1.0000 | TOPICAL_CREAM | Freq: Every day | VAGINAL | 0 refills | Status: DC

## 2024-04-27 MED ORDER — MISOPROSTOL 200 MCG PO TABS: 400.0000 ug | ORAL_TABLET | Freq: Once | ORAL | 0 refills | Status: DC

## 2024-05-04 ENCOUNTER — Encounter: Payer: Self-pay | Admitting: Obstetrics

## 2024-05-05 ENCOUNTER — Telehealth: Payer: Self-pay | Admitting: Obstetrics

## 2024-05-05 ENCOUNTER — Other Ambulatory Visit: Payer: Self-pay

## 2024-05-05 ENCOUNTER — Other Ambulatory Visit: Payer: Self-pay | Admitting: Obstetrics

## 2024-05-05 DIAGNOSIS — N882 Stricture and stenosis of cervix uteri: Secondary | ICD-10-CM

## 2024-05-05 MED ORDER — ESTRADIOL 0.01 % VA CREA: TOPICAL_CREAM | VAGINAL | 0 refills | Status: DC

## 2024-05-05 NOTE — Telephone Encounter (Signed)
 Contact the patient via phone to schedule for colpo (stenotic cervix). The patient is scheduled for 10/23 at 9:15 p[er Dr Leigh. I left message for the patient to contact the office to confirm this appointment.

## 2024-05-08 ENCOUNTER — Other Ambulatory Visit: Payer: Self-pay | Admitting: Obstetrics

## 2024-05-08 DIAGNOSIS — N882 Stricture and stenosis of cervix uteri: Secondary | ICD-10-CM

## 2024-05-08 NOTE — Progress Notes (Unsigned)
 Referring Provider:  Bernarda Schroeder PA-C  HPI:  Karen Donaldson is a 58 y.o.  [id number]  who presents today for evaluation and management of abnormal cervical cytology.  This is a second attempt, previously seen 04/27/24 and cervix stenosed, unable to obtain ECC. Now s/p vaginal Estrace nightly and Cytotec last night.   Prior pap smears:  03/26/2024:  NILM HPV + (undiff) 03/14/2021:  NILM  03/10/2020:  NILM HPV + (undiff) 03/10/2019:  NILM 03/04/2018:  NILM HPV 16 + 02/26/2017:  NILM HPV + 02/23/2016:  NILM HPV + 02/08/2015:  NILM HPV +  Prior cervical / vaginal findings: colposocpy 03/21/2017: Benign ECC 04/14/2015: CIN1, ECC benign  Prior cervical treatment(s): none  Symptoms/History:  -Abnormal vaginal discharge: none -Postmenopausal: yes -Intermenstrual bleeding: none -Postcoital bleeding: none -Bleeding problems (non-gyn): none -Contraception: postmenopausal -Number of current sexual partners: 1 -Number of partners in lifetime: 5 -History of a high risk partner: none -History of STDs: chlamydia in college -Smoking: none -Gardasil Vaccine: no      ROS:  Pertinent items are noted in HPI.  OB History  Gravida Para Term Preterm AB Living  2 2 2   2   SAB IAB Ectopic Multiple Live Births      2    # Outcome Date GA Lbr Len/2nd Weight Sex Type Anes PTL Lv  2 Term 2012   6 lb (2.722 kg) M CS-LTranv   LIV  1 Term 2009   5 lb 2.1 oz (2.327 kg) M CS-LTranv   LIV    Past Medical History:  Diagnosis Date   Abnormal Pap smear of cervix 01/2015   ascus/pos   Allergy    Dyslipidemia    High risk HPV infection    Hyperglycemia    Hypertension    Hypertrophy, uterus    Stress incontinence    Vitamin D  deficiency     Past Surgical History:  Procedure Laterality Date   CESAREAN SECTION  2009, 2012   COLONOSCOPY WITH PROPOFOL  N/A 02/03/2016   Procedure: COLONOSCOPY WITH PROPOFOL ;  Surgeon: Rogelia Copping, MD;  Location: [facility];  Service: Endoscopy;   Laterality: N/A;   TUBAL LIGATION      SOCIAL HISTORY:  Social History   Substance and Sexual Activity  Alcohol Use Yes   Comment: rare    Social History   Substance and Sexual Activity  Drug Use No     Family History  Problem Relation Age of Onset   Hypertension Mother    Diabetes Mother    Dementia Mother    Hypertension Father    Breast cancer Maternal Aunt 40   Brain cancer Maternal Aunt    Ovarian cancer Neg Hx    Colon cancer Neg Hx    Heart disease Neg Hx     ALLERGIES:  Ace inhibitors, Misc. sulfonamide containing compounds, Sulfamethoxazole-trimethoprim, and Sulfur  She has a current medication list which includes the following prescription(s): cetirizine , cholecalciferol, diltiazem , estradiol, fiber, fluticasone , misoprostol, montelukast , multiple vitamin, pantoprazole , triamterene -hydrochlorothiazide , and estradiol.  Physical Exam: -Vitals:  BP (!) 153/88   Pulse 71   Wt 204 lb 11.2 oz (92.9 kg)   LMP 08/21/2018   BMI 38.68 kg/m   PROCEDURE: Colposcopy performed with 4% acetic acid and Lugol's after informed consent obtained.  Physical Exam Vitals and nursing note reviewed. Exam conducted with a chaperone present.  Constitutional:      Appearance: Normal appearance.  HENT:     Head: Normocephalic and atraumatic.  Eyes:  Extraocular Movements: Extraocular movements intact.  Pulmonary:     Effort: Pulmonary effort is normal.  Genitourinary:    General: Normal vulva.     Vagina: Normal.     Cervix: Normal. No lesion.     Comments: Cervix: stenotic Musculoskeletal:        General: Normal range of motion.  Neurological:     Mental Status: She is alert.     Cervix cleansed with Betadine, tenaculum placed, cervix dilated successfully to accommodate the ECC biopsy tool.                            -Aceto-white Lesions Location(s): None              -Biopsy performed: None              -ECC indicated and performed: Yes.     -Satisfactory  colposcopy: Yes.      -Evidence of Invasive cervical CA :  NO  ASSESSMENT:  Karen Donaldson is a 58 y.o. [phone] with NILM and HPV-HR positive, undifferentiated, but with hx of +16 in the past, recent pap (03/26/24), here for colposcopy today, performed as above without complications.  -ECC and 0 cervical bx sent to pathology -Aftercare instructions for home reviewed, si/sx of when to call/return discussed. -Discussed possible outcomes based on pathology; will call with results   Estil Mangle, DO [facility]

## 2024-05-08 NOTE — Patient Instructions (Signed)
 Colposcopy, Care After  The following information offers guidance on how to care for yourself after your procedure. Your doctor may also give you more specific instructions. If you have problems or questions, contact your doctor. What can I expect after the procedure? If you did not have a sample of your tissue taken out (did not have a biopsy), you may only have some spotting of blood for a few days. You can go back to your normal activities. If you had a sample of your tissue taken out, it is common to have: Soreness and mild pain. These may last for a few days. Mild bleeding or fluid (discharge) coming from your vagina. The fluid will look dark and grainy. You may have this for a few days. The fluid may be caused by a liquid that was used during your procedure. You may need to wear a sanitary pad. Spotting of blood for at least 48 hours after the procedure. Follow these instructions at home: Medicines Take over-the-counter and prescription medicines only as told by your doctor. Ask your doctor what over-the-counter pain medicines and prescription medicines you can start taking again. This is very important if you take blood thinners. Activity For at least 3 days, or for as long as told by your doctor, avoid: Douching. Using tampons. Having sex. Return to your normal activities as told by your doctor. Ask your doctor what activities are safe for you. General instructions Ask your doctor if you may take baths, swim, or use a hot tub. You may take showers. If you use birth control (contraception), keep using it. Keep all follow-up visits. Contact a doctor if: You have a fever or chills. You faint or feel light-headed. Get help right away if: You bleed a lot from your vagina. A lot of bleeding means that the bleeding soaks through a pad in less than 1 hour. You have clumps of blood (blood clots) coming from your vagina. You have signs that could mean you have an infection. This may be  fluid coming from your vagina that is: Different than normal. Yellow. Bad-smelling. You have very bad pain or cramps in your lower belly that do not get better with medicine. Summary If you did not have a sample of your tissue taken out, you may only have some spotting of blood for a few days. You can go back to your normal activities. If you had a sample of your tissue taken out, it is common to have mild pain for a few days and spotting for 48 hours. Avoid douching, using tampons, and having sex for at least 3 days after the procedure or for as long as told. Get help right away if you have a lot of bleeding, very bad pain, or signs of infection. This information is not intended to replace advice given to you by your health care provider. Make sure you discuss any questions you have with your health care provider. Document Revised: 12/04/2020 Document Reviewed: 12/04/2020 Elsevier Patient Education  2024 [employer].

## 2024-05-14 ENCOUNTER — Encounter: Payer: Self-pay | Admitting: Obstetrics

## 2024-05-14 ENCOUNTER — Ambulatory Visit (INDEPENDENT_AMBULATORY_CARE_PROVIDER_SITE_OTHER): Admitting: Obstetrics

## 2024-05-14 ENCOUNTER — Other Ambulatory Visit (HOSPITAL_COMMUNITY)
Admission: RE | Admit: 2024-05-14 | Discharge: 2024-05-14 | Disposition: A | Discharge status: Home / Self Care | Source: Ambulatory Visit | Attending: Obstetrics | Admitting: Obstetrics

## 2024-05-14 ENCOUNTER — Ambulatory Visit: Admitting: Obstetrics

## 2024-05-14 VITALS — BP 153/88 | HR 71 | Wt 204.7 lb

## 2024-05-14 DIAGNOSIS — N858 Other specified noninflammatory disorders of uterus: Secondary | ICD-10-CM

## 2024-05-14 DIAGNOSIS — R87810 Cervical high risk human papillomavirus (HPV) DNA test positive: Secondary | ICD-10-CM

## 2024-05-18 LAB — SURGICAL PATHOLOGY

## 2024-05-19 ENCOUNTER — Ambulatory Visit: Payer: Self-pay | Admitting: Obstetrics

## 2024-05-29 ENCOUNTER — Ambulatory Visit
Admission: RE | Admit: 2024-05-29 | Discharge: 2024-05-29 | Disposition: A | Discharge status: Home / Self Care | Source: Ambulatory Visit | Attending: Obstetrics and Gynecology | Admitting: Obstetrics and Gynecology

## 2024-05-29 DIAGNOSIS — Z1231 Encounter for screening mammogram for malignant neoplasm of breast: Secondary | ICD-10-CM | POA: Diagnosis present

## 2024-06-24 ENCOUNTER — Other Ambulatory Visit: Payer: Self-pay | Admitting: Family Medicine

## 2024-06-24 DIAGNOSIS — J302 Other seasonal allergic rhinitis: Secondary | ICD-10-CM

## 2024-07-03 ENCOUNTER — Encounter: Payer: Self-pay | Admitting: Family Medicine

## 2024-07-03 ENCOUNTER — Ambulatory Visit: Admitting: Family Medicine

## 2024-07-03 DIAGNOSIS — I10 Essential (primary) hypertension: Secondary | ICD-10-CM

## 2024-07-03 DIAGNOSIS — J4530 Mild persistent asthma, uncomplicated: Secondary | ICD-10-CM

## 2024-07-03 DIAGNOSIS — R7303 Prediabetes: Secondary | ICD-10-CM

## 2024-07-03 DIAGNOSIS — E66812 Obesity, class 2: Secondary | ICD-10-CM

## 2024-07-03 DIAGNOSIS — Z23 Encounter for immunization: Secondary | ICD-10-CM

## 2024-07-03 DIAGNOSIS — K219 Gastro-esophageal reflux disease without esophagitis: Secondary | ICD-10-CM

## 2024-07-03 DIAGNOSIS — E785 Hyperlipidemia, unspecified: Secondary | ICD-10-CM | POA: Diagnosis not present

## 2024-07-03 DIAGNOSIS — J3089 Other allergic rhinitis: Secondary | ICD-10-CM

## 2024-07-03 DIAGNOSIS — E559 Vitamin D deficiency, unspecified: Secondary | ICD-10-CM

## 2024-07-03 DIAGNOSIS — Z6838 Body mass index (BMI) 38.0-38.9, adult: Secondary | ICD-10-CM

## 2024-07-03 DIAGNOSIS — J302 Other seasonal allergic rhinitis: Secondary | ICD-10-CM

## 2024-07-03 MED ORDER — TRIAMTERENE-HCTZ 37.5-25 MG PO TABS: 0.5000 | ORAL_TABLET | Freq: Every day | ORAL | 1 refills | Status: AC

## 2024-07-03 MED ORDER — MONTELUKAST SODIUM 10 MG PO TABS: 10.0000 mg | ORAL_TABLET | Freq: Every day | ORAL | 1 refills | Status: AC

## 2024-07-03 MED ORDER — AIRSUPRA 90-80 MCG/ACT IN AERO: 2.0000 | INHALATION_SPRAY | Freq: Four times a day (QID) | RESPIRATORY_TRACT | 0 refills | Status: AC | PRN

## 2024-07-03 MED ORDER — PANTOPRAZOLE SODIUM 40 MG PO TBEC: 40.0000 mg | DELAYED_RELEASE_TABLET | Freq: Every day | ORAL | 1 refills | Status: AC

## 2024-07-03 MED ORDER — DILTIAZEM HCL ER COATED BEADS 240 MG PO CP24: 240.0000 mg | ORAL_CAPSULE | Freq: Every day | ORAL | 1 refills | Status: AC

## 2024-07-03 NOTE — Progress Notes (Signed)
 Name: Karen Donaldson   MRN: [id number]    DOB: 1965/11/17   Date:07/03/2024       Progress Note  Subjective  Chief Complaint  Chief Complaint  Patient presents with   Medical Management of Chronic Issues   Discussed the use of AI scribe software for clinical note transcription with the patient, who gave verbal consent to proceed.  History of Present Illness Karen Donaldson is a 58 year old female who presents for a follow-up visit.  She experiences perennial allergic rhinitis, which worsens with seasonal changes, leading to coughing and occasional weakness. Recently, she experienced coughing and vomiting at work. She uses cetirizine  over the counter and montelukast  nightly for her allergies and reactive airway disease. She previously used fluticasone  but has discontinued it unless experiencing significant congestion.  She has a history of ischemic colitis but reports no recent abdominal pain or symptoms related to this condition.  She has essential hypertension and is taking triamterene  HCTZ 37.5/25 mg. No chest pain, palpitations, or shortness of breath.  She has a history of gastroesophageal reflux disease and is currently taking pantoprazole  for reflux symptoms.  Her recent A1c was 6.2, indicating prediabetes. She is working on dietary changes and has lost a few pounds, from 205 to 202.8 lbs. Her mother has diabetes, which is a concern for her.  She has dyslipidemia with a high LDL cholesterol level of 169 mg/dL. She is currently managing this with dietary changes.  She underwent a cervical biopsy in October, which showed benign results.      The 10-year ASCVD risk score (Arnett DK, et al., 2019) is: 6.7%   Values used to calculate the score:     Age: 73 years     Clinically relevant sex: Female     Is Non-Hispanic African American: Yes     Diabetic: No     Tobacco smoker: No     Systolic Blood Pressure: 124 mmHg     Is BP treated: Yes     HDL Cholesterol: 55  mg/dL     Total Cholesterol: 252 mg/dL    Patient Active Problem List   Diagnosis Date Noted   Cervical high risk HPV (human papillomavirus) test positive 04/27/2024   Gastroesophageal reflux disease without esophagitis 01/01/2024   Morbid obesity (HCC) 01/01/2024   RAD (reactive airway disease) with wheezing, mild persistent, uncomplicated 08/21/2022   Chronic constipation 08/21/2022   Menopausal vasomotor syndrome 03/14/2021   Pre-diabetes 08/17/2020   History of ischemic colitis 08/13/2019   CIN I (cervical intraepithelial neoplasia I) 02/22/2015   Benign essential HTN 02/06/2015   Dyslipidemia 02/06/2015   Obesity (BMI 30-39.9) 02/06/2015   Perennial allergic rhinitis with seasonal variation 02/06/2015   Stress incontinence 02/06/2015   Vitamin D  deficiency 02/06/2015    Past Surgical History:  Procedure Laterality Date   CESAREAN SECTION  2009, 2012   COLONOSCOPY WITH PROPOFOL  N/A 02/03/2016   Procedure: COLONOSCOPY WITH PROPOFOL ;  Surgeon: Rogelia Copping, MD;  Location: [facility];  Service: Endoscopy;  Laterality: N/A;   TUBAL LIGATION      Family History  Problem Relation Age of Onset   Hypertension Mother    Diabetes Mother    Dementia Mother    Hypertension Father    Breast cancer Maternal Aunt 50   Brain cancer Maternal Aunt    Ovarian cancer Neg Hx    Colon cancer Neg Hx    Heart disease Neg Hx     Social History  Tobacco Use   Smoking status: Never   Smokeless tobacco: Never  Substance Use Topics   Alcohol use: Yes    Comment: rare    Current Medications[1]  Allergies[2]  I personally reviewed active problem list, medication list, allergies, family history with the patient/caregiver today.   ROS  Ten systems reviewed and is negative except as mentioned in HPI    Objective Physical Exam VITALS: BP- 124/80 MEASUREMENTS: Weight- 202.8, BMI- 38.32. CONSTITUTIONAL: Patient appears well-developed and well-nourished.  No  distress. HEENT: Head atraumatic, normocephalic, neck supple. CARDIOVASCULAR: Normal rate, regular rhythm and normal heart sounds.  No murmur heard. No BLE edema. PULMONARY: Effort normal and breath sounds normal. No respiratory distress. ABDOMINAL: There is no tenderness or distention. MUSCULOSKELETAL: Normal gait. Without gross motor or sensory deficit. PSYCHIATRIC: Patient has a normal mood and affect. behavior is normal. Judgment and thought content normal.  Vitals:   07/03/24 1456  BP: 124/80  Pulse: 85  Resp: 16  SpO2: 97%  Weight: 202 lb 12.8 oz (92 kg)  Height: 5' 1 (1.549 m)    Body mass index is 38.32 kg/m.  Recent Results (from the past 2160 hours)  Surgical pathology     Status: None   Collection Time: 05/14/24  9:28 AM  Result Value Ref Range   SURGICAL PATHOLOGY      SURGICAL PATHOLOGY CASE: [id number] PATIENT: [facility] Surgical Pathology Report     Clinical History: cervical high risk HPV positive, NILM pap (cm)     FINAL MICROSCOPIC DIAGNOSIS:  A. ENDOCERVIX, CURETTAGE: - Fragments of benign endo- and ectocervix.   GROSS DESCRIPTION:  The specimen is received in formalin on a wire brush, and consists of a 1.6 x 1.5 x 0.4 cm aggregate of tan-brown mucus.  The specimen is entirely submitted in 1 cassette.  (KL 05/15/2024)  Final Diagnosis performed by Wally Highland, MD.   Electronically signed 05/18/2024 Technical and / or Professional components performed at [facility]. [facility], [address] [place], [place] [address].  Immunohistochemistry Technical component (if applicable) was performed at [facility]. [address], [address], [place], [place] [address].   IMMUNOHISTOCHEMISTRY DISCLAIMER (if applicable): Some of these immunohistochemical stains may have been developed and t he performance characteristics determine by [facility]. Some may not have been cleared or approved by the U.S.  Food and Drug Administration. The FDA has determined that such clearance or approval is not necessary. This test is used for clinical purposes. It should not be regarded as investigational or for research. This laboratory is certified under the Clinical Laboratory Improvement Amendments of 1988 (CLIA-88) as qualified to perform high complexity clinical laboratory testing.  The controls stained appropriately.   IHC stains are performed on formalin fixed, paraffin embedded tissue using a 3,3diaminobenzidine (DAB) chromogen and Leica Bond Autostainer System. The staining intensity of the nucleus is score manually and is reported as the percentage of tumor cell nuclei demonstrating specific nuclear staining. The specimens are fixed in 10% Neutral Formalin for at least 6 hours and up to 72hrs. These tests are validated on decalcified tissue. Results sh ould be interpreted with caution given the possibility of false negative results on decalcified specimens. Antibody Clones are as follows ER-clone 50F, PR-clone 16, Ki67- clone MM1. Some of these immunohistochemical stains may have been developed and the performance characteristics determined by [facility].       PHQ2/9:    07/03/2024    2:55 PM 01/01/2024  3:09 PM 06/26/2023    2:59 PM 03/26/2023    2:21 PM 02/26/2023    2:34 PM  Depression screen PHQ 2/9  Decreased Interest 0 0 0 0 0  Down, Depressed, Hopeless 0 0 0 0 0  PHQ - 2 Score 0 0 0 0 0  Altered sleeping   0  0  Tired, decreased energy   0  0  Change in appetite   0  0  Feeling bad or failure about yourself    0  0  Trouble concentrating   0  0  Moving slowly or fidgety/restless   0  0  Suicidal thoughts   0  0  PHQ-9 Score   0   0   Difficult doing work/chores   Not difficult at all       Data saved with a previous flowsheet row definition    phq 9 is negative  Fall Risk:    07/03/2024    2:55 PM 01/01/2024    3:09 PM 06/26/2023    2:54 PM 03/26/2023     2:21 PM 02/26/2023    2:34 PM  Fall Risk   Falls in the past year? 0 0 0 0 0  Number falls in past yr: 0 0 0 0   Injury with Fall? 0 0  0  0    Risk for fall due to : No Fall Risks No Fall Risks No Fall Risks No Fall Risks No Fall Risks  Follow up Falls evaluation completed Falls prevention discussed;Education provided;Falls evaluation completed Falls prevention discussed;Education provided;Falls evaluation completed Falls evaluation completed Falls prevention discussed     Data saved with a previous flowsheet row definition      Assessment & Plan Reactive airway disease with perennial allergic rhinitis Perennial allergic rhinitis with seasonal exacerbations. Recent cough and vomiting likely allergy-related. No wheezing or dyspnea. - Prescribed Air Supra inhaler PRN up to QID. - Continue cetirizine .  Essential hypertension Blood pressure controlled at 124/80 mmHg on triamterene  HCTZ. No chest pain, palpitations, or dyspnea. - Continue triamterene  HCTZ 37.5/25 mg daily.  Gastroesophageal reflux disease Previously managed with pantoprazole . No current symptoms. - Continue pantoprazole .  Prediabetes A1c 6.2% in August 2024. Weight decreased from 205 lbs to 202.8 lbs. Family history of diabetes. - Ordered A1c test. - Advised dietary modifications to reduce starch and avoid sweet beverages.  Dyslipidemia LDL 169 mg/dL, elevated. ASCVD risk 6.7% over ten years. Discussed potential medication need if LDL rises. - Ordered lipid panel. - Discussed dietary modifications.  Morbid obesity BMI 38, classified as morbid obesity. Discussed weight management importance due to hypertension and dyslipidemia risks. - Encouraged weight loss through dietary changes and lifestyle modifications.  General health maintenance Recent mammogram and Pap smear normal. HPV positive, Pap normal.        [1]  Current Outpatient Medications:    cetirizine  (ZYRTEC ) 10 MG tablet, Take 10 mg by mouth  daily., Disp: , Rfl:    cholecalciferol (VITAMIN D3) 25 MCG (1000 UNIT) tablet, Take 1,000 Units by mouth daily., Disp: , Rfl:    diltiazem  (CARDIZEM  CD) 240 MG 24 hr capsule, Take 1 capsule (240 mg total) by mouth daily., Disp: 90 capsule, Rfl: 1   estradiol  (ESTRACE ) 0.01 % CREA vaginal cream, Apply 1 gram per vagina every night leading up to procedure. (Patient not taking: Reported on 05/14/2024), Disp: 42.5 g, Rfl: 0   FIBER PO, Take by mouth daily as needed., Disp: , Rfl:    fluticasone  (  FLONASE ) 50 MCG/ACT nasal spray, Place 2 sprays into both nostrils daily., Disp: 16 g, Rfl: 6   montelukast  (SINGULAIR ) 10 MG tablet, TAKE 1 TABLET BY MOUTH EVERYDAY AT BEDTIME, Disp: 90 tablet, Rfl: 0   MULTIPLE VITAMIN PO, Take 1 tablet by mouth daily., Disp: , Rfl:    pantoprazole  (PROTONIX ) 40 MG tablet, Take 1 tablet (40 mg total) by mouth daily., Disp: 90 tablet, Rfl: 4   triamterene -hydrochlorothiazide  (MAXZIDE-25) 37.5-25 MG tablet, Take 0.5-1 tablets by mouth daily. In place of hctz for BP, Disp: 90 tablet, Rfl: 1   estradiol  (ESTRACE  VAGINAL) 0.1 MG/GM vaginal cream, Place 1 Applicatorful vaginally at bedtime. Apply nightly for two weeks, or more leading up to procedure. (Patient not taking: Reported on 05/14/2024), Disp: 42.5 g, Rfl: 0   misoprostol  (CYTOTEC ) 200 MCG tablet, Take 2 tablets (400 mcg total) by mouth once for 1 dose. Take 4-6hrs prior to procedure, or at bedtime the night prior., Disp: 2 tablet, Rfl: 0 [2]  Allergies Allergen Reactions   Ace Inhibitors    Misc. Sulfonamide Containing Compounds    Sulfamethoxazole-Trimethoprim Rash   Sulfur Rash

## 2024-07-04 LAB — CBC WITH DIFFERENTIAL/PLATELET
Absolute Lymphocytes: 2939 {cells}/uL (ref 850–3900)
Absolute Monocytes: 504 {cells}/uL (ref 200–950)
Basophils Absolute: 50 {cells}/uL (ref 0–200)
Basophils Relative: 0.7 %
Eosinophils Absolute: 92 {cells}/uL (ref 15–500)
Eosinophils Relative: 1.3 %
HCT: 45 % (ref 35.9–46.0)
Hemoglobin: 14.9 g/dL (ref 11.7–15.5)
MCH: 27.6 pg (ref 27.0–33.0)
MCHC: 33.1 g/dL (ref 31.6–35.4)
MCV: 83.3 fL (ref 81.4–101.7)
MPV: 11.8 fL (ref 7.5–12.5)
Monocytes Relative: 7.1 %
Neutro Abs: 3515 {cells}/uL (ref 1500–7800)
Neutrophils Relative %: 49.5 %
Platelets: 309 Thousand/uL (ref 140–400)
RBC: 5.4 Million/uL — ABNORMAL HIGH (ref 3.80–5.10)
RDW: 13.9 % (ref 11.0–15.0)
Total Lymphocyte: 41.4 %
WBC: 7.1 Thousand/uL (ref 3.8–10.8)

## 2024-07-04 LAB — COMPREHENSIVE METABOLIC PANEL WITH GFR
AG Ratio: 1.7 (calc) (ref 1.0–2.5)
ALT: 21 U/L (ref 6–29)
AST: 21 U/L (ref 10–35)
Albumin: 4.5 g/dL (ref 3.6–5.1)
Alkaline phosphatase (APISO): 82 U/L (ref 37–153)
BUN/Creatinine Ratio: 17 (calc) (ref 6–22)
BUN: 19 mg/dL (ref 7–25)
CO2: 27 mmol/L (ref 20–32)
Calcium: 9.9 mg/dL (ref 8.6–10.4)
Chloride: 103 mmol/L (ref 98–110)
Creat: 1.09 mg/dL — ABNORMAL HIGH (ref 0.50–1.03)
Globulin: 2.6 g/dL (ref 1.9–3.7)
Glucose, Bld: 89 mg/dL (ref 65–99)
Potassium: 4.2 mmol/L (ref 3.5–5.3)
Sodium: 139 mmol/L (ref 135–146)
Total Bilirubin: 0.4 mg/dL (ref 0.2–1.2)
Total Protein: 7.1 g/dL (ref 6.1–8.1)
eGFR: 59 mL/min/1.73m2 — ABNORMAL LOW (ref >=60)

## 2024-07-04 LAB — HEMOGLOBIN A1C
Hgb A1c MFr Bld: 6 % — ABNORMAL HIGH (ref <5.7)
Mean Plasma Glucose: 126 mg/dL
eAG (mmol/L): 7 mmol/L

## 2024-07-04 LAB — LIPID PANEL
Cholesterol: 216 mg/dL — ABNORMAL HIGH (ref <200)
HDL: 51 mg/dL (ref >=50)
LDL Cholesterol (Calc): 137 mg/dL — ABNORMAL HIGH
Non-HDL Cholesterol (Calc): 165 mg/dL — ABNORMAL HIGH (ref <130)
Total CHOL/HDL Ratio: 4.2 (calc) (ref <5.0)
Triglycerides: 152 mg/dL — ABNORMAL HIGH (ref <150)

## 2024-07-06 ENCOUNTER — Ambulatory Visit: Payer: Self-pay | Admitting: Family Medicine

## 2024-12-21 ENCOUNTER — Ambulatory Visit: Admitting: Family Medicine
